# Patient Record
Sex: Female | Born: 1950
Health system: Southern US, Community
[De-identification: ages and names within clinical notes are randomized; demographics above are authoritative.]

## PROBLEM LIST (undated history)

## (undated) DIAGNOSIS — D649 Anemia, unspecified: Secondary | ICD-10-CM

## (undated) DIAGNOSIS — Z5189 Encounter for other specified aftercare: Secondary | ICD-10-CM

## (undated) DIAGNOSIS — K219 Gastro-esophageal reflux disease without esophagitis: Secondary | ICD-10-CM

## (undated) DIAGNOSIS — H269 Unspecified cataract: Secondary | ICD-10-CM

## (undated) DIAGNOSIS — I10 Essential (primary) hypertension: Secondary | ICD-10-CM

## (undated) DIAGNOSIS — R011 Cardiac murmur, unspecified: Secondary | ICD-10-CM

## (undated) DIAGNOSIS — E119 Type 2 diabetes mellitus without complications: Secondary | ICD-10-CM

## (undated) DIAGNOSIS — R195 Other fecal abnormalities: Secondary | ICD-10-CM

## (undated) DIAGNOSIS — E215 Disorder of parathyroid gland, unspecified: Secondary | ICD-10-CM

## (undated) DIAGNOSIS — R42 Dizziness and giddiness: Secondary | ICD-10-CM

## (undated) DIAGNOSIS — E7800 Pure hypercholesterolemia, unspecified: Secondary | ICD-10-CM

## (undated) DIAGNOSIS — G4730 Sleep apnea, unspecified: Secondary | ICD-10-CM

## (undated) DIAGNOSIS — Z87442 Personal history of urinary calculi: Secondary | ICD-10-CM

## (undated) DIAGNOSIS — N200 Calculus of kidney: Secondary | ICD-10-CM

## (undated) DIAGNOSIS — IMO0002 Reserved for concepts with insufficient information to code with codable children: Secondary | ICD-10-CM

## (undated) HISTORY — DX: Unspecified cataract: H26.9

## (undated) HISTORY — PX: ESOPHAGOGASTRODUODENOSCOPY ENDOSCOPY: SHX5814

## (undated) HISTORY — DX: Encounter for other specified aftercare: Z51.89

## (undated) HISTORY — DX: Reserved for concepts with insufficient information to code with codable children: IMO0002

## (undated) HISTORY — DX: Cardiac murmur, unspecified: R01.1

## (undated) HISTORY — PX: COLONOSCOPY: SHX174

---

## 1988-07-03 HISTORY — PX: PARATHYROIDECTOMY: SHX19

## 2000-07-03 HISTORY — PX: LITHOTRIPSY: SUR834

## 2004-04-14 ENCOUNTER — Ambulatory Visit: Payer: Self-pay | Admitting: Family Medicine

## 2005-01-05 ENCOUNTER — Emergency Department: Payer: Self-pay | Admitting: Internal Medicine

## 2005-01-05 ENCOUNTER — Other Ambulatory Visit: Payer: Self-pay

## 2005-04-20 ENCOUNTER — Ambulatory Visit: Payer: Self-pay | Admitting: Family Medicine

## 2005-05-03 ENCOUNTER — Ambulatory Visit: Payer: Self-pay | Admitting: Family Medicine

## 2005-09-11 ENCOUNTER — Ambulatory Visit: Payer: Self-pay | Admitting: Unknown Physician Specialty

## 2006-05-15 ENCOUNTER — Ambulatory Visit: Payer: Self-pay | Admitting: Family Medicine

## 2007-06-13 ENCOUNTER — Ambulatory Visit: Payer: Self-pay | Admitting: Family Medicine

## 2008-07-09 ENCOUNTER — Ambulatory Visit: Payer: Self-pay | Admitting: Family Medicine

## 2009-07-27 ENCOUNTER — Ambulatory Visit: Payer: Self-pay | Admitting: Family Medicine

## 2009-10-08 ENCOUNTER — Ambulatory Visit: Payer: Self-pay | Admitting: Urology

## 2009-11-08 ENCOUNTER — Ambulatory Visit: Payer: Self-pay | Admitting: Urology

## 2010-02-08 ENCOUNTER — Ambulatory Visit: Payer: Self-pay | Admitting: Urology

## 2010-09-05 ENCOUNTER — Ambulatory Visit: Payer: Self-pay | Admitting: Urology

## 2010-09-13 ENCOUNTER — Ambulatory Visit: Payer: Self-pay | Admitting: Family Medicine

## 2011-08-25 ENCOUNTER — Ambulatory Visit: Payer: Self-pay | Admitting: Family Medicine

## 2011-09-04 LAB — HM PAP SMEAR: HM Pap smear: NEGATIVE

## 2011-09-05 ENCOUNTER — Ambulatory Visit: Payer: Self-pay | Admitting: Urology

## 2011-10-10 ENCOUNTER — Ambulatory Visit: Payer: Self-pay | Admitting: Family Medicine

## 2012-09-09 ENCOUNTER — Ambulatory Visit: Payer: Self-pay | Admitting: Urology

## 2012-10-10 ENCOUNTER — Ambulatory Visit: Payer: Self-pay | Admitting: Family Medicine

## 2013-05-21 ENCOUNTER — Ambulatory Visit: Payer: Self-pay | Admitting: Family Medicine

## 2013-11-25 LAB — HM COLONOSCOPY

## 2014-04-21 ENCOUNTER — Ambulatory Visit: Payer: Self-pay | Admitting: Family Medicine

## 2014-04-21 LAB — HM MAMMOGRAPHY

## 2014-07-17 LAB — LIPID PANEL
Cholesterol: 217 mg/dL — AB (ref 0–200)
HDL: 41 mg/dL (ref 35–70)
LDL Cholesterol: 130 mg/dL
Triglycerides: 231 mg/dL — AB (ref 40–160)

## 2014-07-17 LAB — CBC AND DIFFERENTIAL: Neutrophils Absolute: 6 /uL

## 2014-07-17 LAB — TSH: TSH: 1.34 u[IU]/mL (ref <=5.90)

## 2014-08-10 LAB — CBC AND DIFFERENTIAL
HCT: 42 % (ref 36–46)
Hemoglobin: 13.6 g/dL (ref 12.0–16.0)
Platelets: 250 10*3/uL (ref 150–399)
WBC: 5.9 10^3/mL

## 2014-08-10 LAB — HEPATIC FUNCTION PANEL
ALT: 9 U/L (ref 7–35)
AST: 16 U/L (ref 13–35)
Alkaline Phosphatase: 82 U/L (ref 25–125)
Bilirubin, Total: 0.2 mg/dL

## 2014-08-10 LAB — BASIC METABOLIC PANEL
BUN: 15 mg/dL (ref 4–21)
Creatinine: 1.2 mg/dL — AB (ref 0.5–1.1)
Glucose: 89 mg/dL
Potassium: 4.2 mmol/L (ref 3.4–5.3)
Sodium: 143 mmol/L (ref 137–147)

## 2014-10-09 LAB — HEMOGLOBIN A1C: Hgb A1c MFr Bld: 6.2 % — AB (ref 4.0–6.0)

## 2014-11-23 DIAGNOSIS — E1121 Type 2 diabetes mellitus with diabetic nephropathy: Secondary | ICD-10-CM | POA: Insufficient documentation

## 2014-11-23 DIAGNOSIS — IMO0002 Reserved for concepts with insufficient information to code with codable children: Secondary | ICD-10-CM | POA: Insufficient documentation

## 2014-11-23 DIAGNOSIS — E7800 Pure hypercholesterolemia, unspecified: Secondary | ICD-10-CM | POA: Insufficient documentation

## 2014-11-23 DIAGNOSIS — E1122 Type 2 diabetes mellitus with diabetic chronic kidney disease: Secondary | ICD-10-CM | POA: Insufficient documentation

## 2014-11-23 DIAGNOSIS — N183 Chronic kidney disease, stage 3 unspecified: Secondary | ICD-10-CM | POA: Insufficient documentation

## 2014-11-23 DIAGNOSIS — K760 Fatty (change of) liver, not elsewhere classified: Secondary | ICD-10-CM | POA: Insufficient documentation

## 2014-11-23 DIAGNOSIS — E1169 Type 2 diabetes mellitus with other specified complication: Secondary | ICD-10-CM | POA: Insufficient documentation

## 2014-11-23 DIAGNOSIS — G479 Sleep disorder, unspecified: Secondary | ICD-10-CM | POA: Insufficient documentation

## 2014-11-23 DIAGNOSIS — E669 Obesity, unspecified: Secondary | ICD-10-CM | POA: Insufficient documentation

## 2014-11-23 DIAGNOSIS — I10 Essential (primary) hypertension: Secondary | ICD-10-CM | POA: Insufficient documentation

## 2014-11-23 DIAGNOSIS — N184 Chronic kidney disease, stage 4 (severe): Secondary | ICD-10-CM | POA: Insufficient documentation

## 2014-11-23 DIAGNOSIS — B029 Zoster without complications: Secondary | ICD-10-CM | POA: Insufficient documentation

## 2014-11-23 DIAGNOSIS — K635 Polyp of colon: Secondary | ICD-10-CM | POA: Insufficient documentation

## 2014-11-23 DIAGNOSIS — I152 Hypertension secondary to endocrine disorders: Secondary | ICD-10-CM | POA: Insufficient documentation

## 2014-12-25 ENCOUNTER — Telehealth: Payer: Self-pay | Admitting: Family Medicine

## 2014-12-25 NOTE — Telephone Encounter (Signed)
This is a pt of Dr. Venia Minks. Mickel Baas with [facility] stated that  glipiZIDE (GLUCOTROL XL) 2.5 MG 24 hr tablet is on backorder and she would like to change the RX to 5 mg instead of 2.5 mg and pt take 1/2 a tablet instead of a whole tablet. Mickel Baas stated we could send in a new RX or we can give a verbal order. Thanks TNP

## 2014-12-25 NOTE — Telephone Encounter (Signed)
OK to take 1/2 of a 5mg  glipizide tablet. Can dispense #15.

## 2014-12-25 NOTE — Telephone Encounter (Signed)
Mickel Baas from [facility] notified.

## 2015-02-10 ENCOUNTER — Ambulatory Visit (INDEPENDENT_AMBULATORY_CARE_PROVIDER_SITE_OTHER): Payer: BLUE CROSS/BLUE SHIELD | Admitting: Family Medicine

## 2015-02-10 ENCOUNTER — Encounter: Payer: Self-pay | Admitting: Family Medicine

## 2015-02-10 VITALS — BP 124/78 | HR 72 | Temp 98.2°F | Resp 16 | Ht 69.0 in | Wt 236.0 lb

## 2015-02-10 DIAGNOSIS — I10 Essential (primary) hypertension: Secondary | ICD-10-CM

## 2015-02-10 DIAGNOSIS — E1121 Type 2 diabetes mellitus with diabetic nephropathy: Secondary | ICD-10-CM

## 2015-02-10 DIAGNOSIS — E119 Type 2 diabetes mellitus without complications: Secondary | ICD-10-CM | POA: Diagnosis not present

## 2015-02-10 DIAGNOSIS — N183 Chronic kidney disease, stage 3 unspecified: Secondary | ICD-10-CM

## 2015-02-10 DIAGNOSIS — N200 Calculus of kidney: Secondary | ICD-10-CM | POA: Insufficient documentation

## 2015-02-10 LAB — POCT GLYCOSYLATED HEMOGLOBIN (HGB A1C)
Est. average glucose Bld gHb Est-mCnc: 126
Hemoglobin A1C: 6

## 2015-02-10 LAB — POCT UA - MICROALBUMIN: Microalbumin Ur, POC: NEGATIVE mg/L

## 2015-02-10 MED ORDER — ASPIRIN 81 MG PO TABS: 81.0000 mg | ORAL_TABLET | Freq: Every day | ORAL | Status: DC

## 2015-02-10 NOTE — Progress Notes (Signed)
Subjective:    Patient ID: Debra Paul, female    DOB: 1950-12-04, 64 y.o.   MRN: [id number]  Diabetes She presents for her follow-up (Last A1C was 10/09/2014, and the result was 6.2%) diabetic visit. She has type 2 diabetes mellitus. There are no hypoglycemic associated symptoms. Pertinent negatives for hypoglycemia include no headaches or sweats. Associated symptoms include polydipsia. Pertinent negatives for diabetes include no blurred vision, no chest pain, no fatigue, no foot paresthesias, no foot ulcerations, no polyphagia, no polyuria, no visual change, no weakness and no weight loss. Current diabetic treatment includes oral agent (triple therapy). She is following a generally unhealthy diet. She never participates in exercise. (BS Not being checked) An ACE inhibitor/angiotensin II receptor blocker is being taken. She does not see a podiatrist.Eye exam is current.  Hypertension This is a chronic problem. Pertinent negatives include no anxiety, blurred vision, chest pain, headaches, malaise/fatigue, neck pain, orthopnea, palpitations, peripheral edema, shortness of breath or sweats. Treatments tried: Lisinopril 10 mg.      Review of Systems  Constitutional: Negative for weight loss, malaise/fatigue and fatigue.  Eyes: Negative for blurred vision.  Respiratory: Negative for shortness of breath.   Cardiovascular: Negative for chest pain, palpitations and orthopnea.  Endocrine: Positive for polydipsia. Negative for polyphagia and polyuria.  Musculoskeletal: Negative for neck pain.  Neurological: Negative for weakness and headaches.     Patient Active Problem List   Diagnosis Date Noted  . Calculus of kidney 02/10/2015  . Benign hypertension 11/23/2014  . Adult BMI 30+ 11/23/2014  . Chronic kidney disease (CKD), stage III (moderate) 11/23/2014  . Colon polyp 11/23/2014  . Diabetes 11/23/2014  . Fatty infiltration of liver 11/23/2014  . Herpes zona 11/23/2014  .  Hypercholesteremia 11/23/2014  . Disordered sleep 11/23/2014   No past medical history on file. Current Outpatient Prescriptions on File Prior to Visit  Medication Sig  . glipiZIDE (GLUCOTROL XL) 2.5 MG 24 hr tablet Take by mouth.  Marland Kitchen lisinopril (PRINIVIL,ZESTRIL) 10 MG tablet Take by mouth.  . metFORMIN (GLUCOPHAGE) 1000 MG tablet Take by mouth.  . sitaGLIPtin (JANUVIA) 50 MG tablet Take by mouth.   No current facility-administered medications on file prior to visit.   Allergies  Allergen Reactions  . Ciprofloxacin    Past Surgical History  Procedure Laterality Date  . Lithotripsy  2002  . Parathyroidectomy  1990   Social History   Social History  . Marital Status: Single    Spouse Name: N/A  . Number of Children: N/A  . Years of Education: N/A   Occupational History  . Not on file.   Social History Main Topics  . Smoking status: Never Smoker   . Smokeless tobacco: Never Used  . Alcohol Use: No  . Drug Use: No  . Sexual Activity: Not on file   Other Topics Concern  . Not on file   Social History Narrative   Family History  Problem Relation Age of Onset  . Colon cancer Father   . Heart disease Mother         Objective:   Physical Exam  Constitutional: She is oriented to person, place, and time. She appears well-developed and well-nourished.  Cardiovascular: Normal rate and regular rhythm.   Neurological: She is alert and oriented to person, place, and time.  Psychiatric: She has a normal mood and affect. Her behavior is normal. Judgment and thought content normal.   Diabetic Foot Exam - Simple   Simple Foot Form  Diabetic Foot exam was performed with the following findings:  Yes 02/10/2015  8:47 AM  Visual Inspection  No deformities, no ulcerations, no other skin breakdown bilaterally:  Yes  Sensation Testing  Intact to touch and monofilament testing bilaterally:  Yes  Pulse Check  Posterior Tibialis and Dorsalis pulse intact bilaterally:  Yes   Comments     BP 124/78 mmHg  Pulse 72  Temp(Src) 98.2 F (36.8 C) (Oral)  Resp 16  Ht 5\' 9"  (1.753 m)  Wt 236 lb (107.049 kg)  BMI 34.84 kg/m2        Assessment & Plan:  1. Benign hypertension Stable.  Continue current medication.  Recheck in 3 months.   2. Chronic kidney disease (CKD), stage III (moderate) Stable. Continue ACE. No proteinuria.  Results for orders placed or performed in visit on 02/10/15  POCT glycosylated hemoglobin (Hb A1C)  Result Value Ref Range   Hemoglobin A1C 6.0    Est. average glucose Bld gHb Est-mCnc 126   POCT UA - Microalbumin  Result Value Ref Range   Microalbumin Ur, POC Negative mg/L     3. Controlled type 2 diabetes mellitus with diabetic nephropathy Stable. Continue current medication except stop Glipizide. Recheck in 3 months.   Margarita Rana, MD

## 2015-02-20 ENCOUNTER — Other Ambulatory Visit: Payer: Self-pay | Admitting: Family Medicine

## 2015-02-20 DIAGNOSIS — E1121 Type 2 diabetes mellitus with diabetic nephropathy: Secondary | ICD-10-CM

## 2015-03-03 ENCOUNTER — Other Ambulatory Visit: Payer: Self-pay | Admitting: Family Medicine

## 2015-03-03 DIAGNOSIS — I10 Essential (primary) hypertension: Secondary | ICD-10-CM

## 2015-04-17 ENCOUNTER — Ambulatory Visit (INDEPENDENT_AMBULATORY_CARE_PROVIDER_SITE_OTHER): Payer: BLUE CROSS/BLUE SHIELD | Admitting: Family Medicine

## 2015-04-17 ENCOUNTER — Encounter: Payer: Self-pay | Admitting: Family Medicine

## 2015-04-17 VITALS — BP 128/84 | HR 74 | Temp 98.0°F | Resp 16 | Ht 69.0 in | Wt 237.0 lb

## 2015-04-17 DIAGNOSIS — N183 Chronic kidney disease, stage 3 unspecified: Secondary | ICD-10-CM

## 2015-04-17 DIAGNOSIS — E1121 Type 2 diabetes mellitus with diabetic nephropathy: Secondary | ICD-10-CM | POA: Diagnosis not present

## 2015-04-17 DIAGNOSIS — J40 Bronchitis, not specified as acute or chronic: Secondary | ICD-10-CM | POA: Diagnosis not present

## 2015-04-17 MED ORDER — AZITHROMYCIN 250 MG PO TABS: ORAL_TABLET | ORAL | Status: DC

## 2015-04-17 NOTE — Progress Notes (Signed)
Patient ID: Debra Paul, female   DOB: 1950-10-18, 64 y.o.   MRN: [id number]       Patient: Debra Paul Female    DOB: 1951/02/19   64 y.o.   MRN: [id number] Visit Date: 04/17/2015  Today's Provider: Margarita Rana, MD   Chief Complaint  Patient presents with  . Cough    x 3 weeks.    Subjective:    Cough The current episode started 1 to 4 weeks ago (Going into her third week. ). The problem has been gradually worsening. The problem occurs constantly. The cough is productive of sputum (green in color ). Associated symptoms include chills, ear congestion, nasal congestion, postnasal drip, a sore throat, shortness of breath and wheezing. Pertinent negatives include no fever or rash. Nothing aggravates the symptoms. She has tried OTC cough suppressant for the symptoms. The treatment provided no relief. There is no history of asthma or environmental allergies.   Has been trying to drink a lot of  Liquids and take honey. Still with cough.     Allergies  Allergen Reactions  . Ciprofloxacin    Previous Medications   ASPIRIN EC 81 MG TABLET    Take 81 mg by mouth.   GLIPIZIDE (GLUCOTROL) 5 MG TABLET    TAKE 1/2 TABLET BY MOUTH DAILY   LISINOPRIL (PRINIVIL,ZESTRIL) 10 MG TABLET    TAKE 1 TABLET BY MOUTH EVERY DAY   METFORMIN (GLUCOPHAGE) 1000 MG TABLET    Take by mouth.   SITAGLIPTIN (JANUVIA) 50 MG TABLET    Take by mouth.    Review of Systems  Constitutional: Positive for chills. Negative for fever.  HENT: Positive for postnasal drip and sore throat.   Respiratory: Positive for cough, shortness of breath and wheezing.   Cardiovascular: Negative.   Skin: Negative for rash.  Allergic/Immunologic: Negative for environmental allergies.    Social History  Substance Use Topics  . Smoking status: Never Smoker   . Smokeless tobacco: Never Used  . Alcohol Use: No   Objective:   BP 128/84 mmHg  Pulse 74  Temp(Src) 98 F (36.7 C)  Resp 16  Ht 5\' 9"  (1.753 m)  Wt  237 lb (107.502 kg)  BMI 34.98 kg/m2  SpO2 98%  Physical Exam  Constitutional: She is oriented to person, place, and time. She appears well-developed and well-nourished.  HENT:  Head: Normocephalic and atraumatic.  Right Ear: External ear normal.  Left Ear: External ear normal.  Mouth/Throat: Oropharynx is clear and moist.  Eyes: Conjunctivae and EOM are normal. Pupils are equal, round, and reactive to light.  Neck: Normal range of motion. Neck supple.  Cardiovascular: Normal rate and regular rhythm.   Pulmonary/Chest: Effort normal. She has wheezes (Inspiratory and expiratory).  Neurological: She is alert and oriented to person, place, and time.  Psychiatric: She has a normal mood and affect. Her behavior is normal. Judgment and thought content normal.      Assessment & Plan:     1. Bronchitis New problem. Will start medication and call if does not improve. May need steroid secondary to wheezing.  Monitor for fevers and SOB most especially.   - azithromycin (ZITHROMAX) 250 MG tablet; 2 today and then one a day for 4 more days.  Dispense: 6 tablet; Refill: 0  2. Chronic kidney disease (CKD), stage III (moderate) Stable.    3. Controlled type 2 diabetes mellitus with diabetic nephropathy, without long-term current use of insulin ([place]) Thinks up a little  secondary to cough aids she has been using, but stable over all.  Recheck as scheduled.   Margarita Rana, MD       Margarita Rana, MD  [facility]

## 2015-04-29 ENCOUNTER — Other Ambulatory Visit: Payer: Self-pay | Admitting: Family Medicine

## 2015-04-29 DIAGNOSIS — Z1231 Encounter for screening mammogram for malignant neoplasm of breast: Secondary | ICD-10-CM

## 2015-05-04 ENCOUNTER — Ambulatory Visit
Admission: RE | Admit: 2015-05-04 | Discharge: 2015-05-04 | Disposition: A | Discharge status: Home / Self Care | Payer: BLUE CROSS/BLUE SHIELD | Source: Ambulatory Visit | Attending: Family Medicine | Admitting: Family Medicine

## 2015-05-04 DIAGNOSIS — Z1231 Encounter for screening mammogram for malignant neoplasm of breast: Secondary | ICD-10-CM | POA: Insufficient documentation

## 2015-05-06 ENCOUNTER — Other Ambulatory Visit: Payer: Self-pay | Admitting: Family Medicine

## 2015-05-06 DIAGNOSIS — E1121 Type 2 diabetes mellitus with diabetic nephropathy: Secondary | ICD-10-CM

## 2015-05-14 ENCOUNTER — Encounter: Payer: Self-pay | Admitting: Family Medicine

## 2015-05-14 ENCOUNTER — Ambulatory Visit (INDEPENDENT_AMBULATORY_CARE_PROVIDER_SITE_OTHER): Payer: BLUE CROSS/BLUE SHIELD | Admitting: Family Medicine

## 2015-05-14 VITALS — BP 152/90 | HR 68 | Temp 98.4°F | Resp 16 | Wt 233.0 lb

## 2015-05-14 DIAGNOSIS — N183 Chronic kidney disease, stage 3 unspecified: Secondary | ICD-10-CM

## 2015-05-14 DIAGNOSIS — E1121 Type 2 diabetes mellitus with diabetic nephropathy: Secondary | ICD-10-CM

## 2015-05-14 DIAGNOSIS — I10 Essential (primary) hypertension: Secondary | ICD-10-CM | POA: Diagnosis not present

## 2015-05-14 LAB — POCT GLYCOSYLATED HEMOGLOBIN (HGB A1C)
Est. average glucose Bld gHb Est-mCnc: 134
Hemoglobin A1C: 6.3

## 2015-05-14 LAB — POCT UA - MICROALBUMIN: Microalbumin Ur, POC: 100 mg/L

## 2015-05-14 NOTE — Progress Notes (Signed)
Subjective:    Patient ID: Debra Paul, female    DOB: 02-Mar-1951, 64 y.o.   MRN: [id number]  Diabetes She presents for her follow-up (Last A1C 02/10/2015 and was 6.0% Pt D/C Glipizide at [place]) diabetic visit. She has type 2 diabetes mellitus. Pertinent negatives for hypoglycemia include no headaches or sweats. Pertinent negatives for diabetes include no blurred vision, no chest pain, no fatigue, no foot paresthesias, no foot ulcerations, no polydipsia, no polyphagia, no polyuria, no visual change, no weakness and no weight loss. Current diabetic treatment includes oral agent (dual therapy) (Metformin and Januvia). She is compliant with treatment all of the time. Home blood sugar record trend: not being checked. An ACE inhibitor/angiotensin II receptor blocker is being taken (Lisinopril 10mg ). Eye exam is current (Summer 2016).  Hypertension This is a chronic problem. The problem is uncontrolled. Associated symptoms include anxiety (due to job stress). Pertinent negatives include no blurred vision, chest pain, headaches, malaise/fatigue, neck pain, orthopnea, palpitations, peripheral edema, shortness of breath or sweats. Past treatments include ACE inhibitors (Lisinopril 10 mg). There are no compliance problems.       Review of Systems  Constitutional: Negative for weight loss, malaise/fatigue and fatigue.  Eyes: Negative for blurred vision.  Respiratory: Negative for shortness of breath.   Cardiovascular: Negative for chest pain, palpitations and orthopnea.  Endocrine: Negative for polydipsia, polyphagia and polyuria.  Musculoskeletal: Negative for neck pain.  Neurological: Negative for weakness and headaches.   BP 152/90 mmHg  Pulse 68  Temp(Src) 98.4 F (36.9 C) (Oral)  Resp 16  Wt 233 lb (105.688 kg)   Patient Active Problem List   Diagnosis Date Noted  . Calculus of kidney 02/10/2015  . Benign hypertension 11/23/2014  . Adult BMI 30+ 11/23/2014  . Chronic kidney  disease (CKD), stage III (moderate) 11/23/2014  . Colon polyp 11/23/2014  . Controlled type 2 diabetes mellitus with diabetic nephropathy ([place]) 11/23/2014  . Fatty infiltration of liver 11/23/2014  . Herpes zona 11/23/2014  . Hypercholesteremia 11/23/2014  . Disordered sleep 11/23/2014   No past medical history on file. Current Outpatient Prescriptions on File Prior to Visit  Medication Sig  . aspirin EC 81 MG tablet Take 81 mg by mouth.  Marland Kitchen JANUVIA 50 MG tablet TAKE 1 TABLET BY MOUTH EVERY DAY  . lisinopril (PRINIVIL,ZESTRIL) 10 MG tablet TAKE 1 TABLET BY MOUTH EVERY DAY  . metFORMIN (GLUCOPHAGE) 1000 MG tablet Take by mouth.   No current facility-administered medications on file prior to visit.   Allergies  Allergen Reactions  . Ciprofloxacin    Past Surgical History  Procedure Laterality Date  . Lithotripsy  2002  . Parathyroidectomy  1990   Social History   Social History  . Marital Status: Single    Spouse Name: N/A  . Number of Children: N/A  . Years of Education: N/A   Occupational History  . Not on file.   Social History Main Topics  . Smoking status: Never Smoker   . Smokeless tobacco: Never Used  . Alcohol Use: No  . Drug Use: No  . Sexual Activity: Not on file   Other Topics Concern  . Not on file   Social History Narrative   Family History  Problem Relation Age of Onset  . Colon cancer Father   . Heart disease Mother       Objective:   Physical Exam  Constitutional: She is oriented to person, place, and time. She appears well-developed and well-nourished.  Cardiovascular: Normal rate and regular rhythm.   Pulmonary/Chest: Effort normal and breath sounds normal.  Neurological: She is alert and oriented to person, place, and time.  Psychiatric: She has a normal mood and affect. Her behavior is normal. Thought content normal.    BP 152/90 mmHg  Pulse 68  Temp(Src) 98.4 F (36.9 C) (Oral)  Resp 16  Wt 233 lb (105.688 kg)     Assessment &  Plan:  1. Controlled type 2 diabetes mellitus with diabetic nephropathy, without long-term current use of insulin (HCC) Condition is stable. Please continue current medication and  plan of care as noted.   - POCT glycosylated hemoglobin (Hb A1C) - POCT UA - Microalbumin Results for orders placed or performed in visit on 05/14/15  POCT glycosylated hemoglobin (Hb A1C)  Result Value Ref Range   Hemoglobin A1C 6.3    Est. average glucose Bld gHb Est-mCnc 134   POCT UA - Microalbumin  Result Value Ref Range   Microalbumin Ur, POC 100 mg/L    2. Benign hypertension Stable.    3. Chronic kidney disease (CKD), stage III (moderate) Stable.   Margarita Rana, MD

## 2015-05-31 ENCOUNTER — Other Ambulatory Visit: Payer: Self-pay | Admitting: Family Medicine

## 2015-05-31 DIAGNOSIS — E1121 Type 2 diabetes mellitus with diabetic nephropathy: Secondary | ICD-10-CM

## 2015-08-16 ENCOUNTER — Ambulatory Visit (INDEPENDENT_AMBULATORY_CARE_PROVIDER_SITE_OTHER): Payer: BLUE CROSS/BLUE SHIELD | Admitting: Family Medicine

## 2015-08-16 ENCOUNTER — Encounter: Payer: Self-pay | Admitting: Family Medicine

## 2015-08-16 VITALS — BP 152/82 | HR 72 | Temp 97.7°F | Resp 16 | Wt 236.0 lb

## 2015-08-16 DIAGNOSIS — E1121 Type 2 diabetes mellitus with diabetic nephropathy: Secondary | ICD-10-CM | POA: Diagnosis not present

## 2015-08-16 DIAGNOSIS — I10 Essential (primary) hypertension: Secondary | ICD-10-CM

## 2015-08-16 DIAGNOSIS — R202 Paresthesia of skin: Secondary | ICD-10-CM

## 2015-08-16 DIAGNOSIS — R809 Proteinuria, unspecified: Secondary | ICD-10-CM | POA: Diagnosis not present

## 2015-08-16 LAB — POCT GLYCOSYLATED HEMOGLOBIN (HGB A1C)
Est. average glucose Bld gHb Est-mCnc: 148
Hemoglobin A1C: 6.8

## 2015-08-16 MED ORDER — LISINOPRIL 20 MG PO TABS: 20.0000 mg | ORAL_TABLET | Freq: Every day | ORAL | Status: DC

## 2015-08-16 NOTE — Progress Notes (Signed)
Subjective:    Patient ID: Debra Paul, female    DOB: 03/14/1951, 65 y.o.   MRN: [id number]  Diabetes She presents for her follow-up (Last A1C was 05/14/2015 and was 6.3%) diabetic visit. She has type 2 diabetes mellitus. Her disease course has been worsening. There are no hypoglycemic associated symptoms. Associated symptoms include foot paresthesias (new problem; "tingling"). Pertinent negatives for diabetes include no blurred vision, no chest pain, no fatigue, no foot ulcerations, no polydipsia, no polyphagia, no polyuria, no visual change, no weakness and no weight loss. Symptoms are worsening (parasthesias). Risk factors for coronary artery disease include diabetes mellitus, dyslipidemia, obesity, family history and post-menopausal. Current diabetic treatment includes oral agent (dual therapy) (Metformin 1000 mg, Januvia 50 mg). She is compliant with treatment all of the time. She is following a generally healthy diet. She never (too busy with work) participates in exercise. Home blood sugar record trend: not being checked. An ACE inhibitor/angiotensin II receptor blocker is being taken. Eye exam is current.  Hypertension This is a chronic problem. The problem has been gradually worsening (BP today is 152/82) since onset. The problem is uncontrolled. Pertinent negatives include no anxiety, blurred vision, chest pain, malaise/fatigue, neck pain, orthopnea, palpitations, peripheral edema or shortness of breath. Treatments tried: Lisinopril 10 mg. There are no compliance problems.     Review of Systems  Constitutional: Negative for weight loss, malaise/fatigue and fatigue.  Eyes: Negative for blurred vision.  Respiratory: Negative for shortness of breath.   Cardiovascular: Negative for chest pain, palpitations and orthopnea.  Endocrine: Negative for polydipsia, polyphagia and polyuria.  Musculoskeletal: Negative for neck pain.  Neurological: Negative for weakness.   BP 152/82 mmHg   Pulse 72  Temp(Src) 97.7 F (36.5 C) (Oral)  Resp 16  Wt 236 lb (107.049 kg)   Patient Active Problem List   Diagnosis Date Noted  . Calculus of kidney 02/10/2015  . Benign hypertension 11/23/2014  . Adult BMI 30+ 11/23/2014  . Chronic kidney disease (CKD), stage III (moderate) 11/23/2014  . Colon polyp 11/23/2014  . Controlled type 2 diabetes mellitus with diabetic nephropathy ([place]) 11/23/2014  . Fatty infiltration of liver 11/23/2014  . Herpes zona 11/23/2014  . Hypercholesteremia 11/23/2014  . Disordered sleep 11/23/2014   No past medical history on file. Current Outpatient Prescriptions on File Prior to Visit  Medication Sig  . aspirin EC 81 MG tablet Take 81 mg by mouth.  Marland Kitchen JANUVIA 50 MG tablet TAKE 1 TABLET BY MOUTH EVERY DAY  . lisinopril (PRINIVIL,ZESTRIL) 10 MG tablet TAKE 1 TABLET BY MOUTH EVERY DAY  . metFORMIN (GLUCOPHAGE) 1000 MG tablet Take by mouth.   No current facility-administered medications on file prior to visit.   Allergies  Allergen Reactions  . Ciprofloxacin    Past Surgical History  Procedure Laterality Date  . Lithotripsy  2002  . Parathyroidectomy  1990   Social History   Social History  . Marital Status: Single    Spouse Name: N/A  . Number of Children: N/A  . Years of Education: N/A   Occupational History  . Not on file.   Social History Main Topics  . Smoking status: Never Smoker   . Smokeless tobacco: Never Used  . Alcohol Use: No  . Drug Use: No  . Sexual Activity: Not on file   Other Topics Concern  . Not on file   Social History Narrative   Family History  Problem Relation Age of Onset  . Colon cancer  Father   . Heart disease Mother       Objective:   Physical Exam  Constitutional: She appears well-developed and well-nourished.  Cardiovascular: Normal rate and regular rhythm.   Pulmonary/Chest: Effort normal and breath sounds normal.  Neurological:  Monofilament Normal  Psychiatric: She has a normal mood and  affect. Her behavior is normal.   Diabetic Foot Exam - Simple   Simple Foot Form  Diabetic Foot exam was performed with the following findings:  Yes 08/16/2015  8:48 AM  Visual Inspection  No deformities, no ulcerations, no other skin breakdown bilaterally:  Yes  Sensation Testing  Intact to touch and monofilament testing bilaterally:  Yes  Pulse Check  Posterior Tibialis and Dorsalis pulse intact bilaterally:  Yes  Comments     BP 152/82 mmHg  Pulse 72  Temp(Src) 97.7 F (36.5 C) (Oral)  Resp 16  Wt 236 lb (107.049 kg)     Assessment & Plan:  1. Controlled type 2 diabetes mellitus with diabetic nephropathy, without long-term current use of insulin (HCC) Worsening. Check labs. FU pending results. - POCT glycosylated hemoglobin (Hb A1C) - CBC with Differential/Platelet - Lipid panel  2. Paresthesia of left foot New problem. Monofilament normal. Concerning due to increased A1C. Check labs. If labs are WNL, will consider adding DM medication.  - TSH - Vitamin B12   3. Albuminuria Last urine microalbumin was checked 05/14/2015 and was 100. Increase Lisinopril to 20 mg po qd.  4. Benign hypertension Not to goal. Increase medication as below. Check labs. FU pending results. - lisinopril (PRINIVIL,ZESTRIL) 20 MG tablet; Take 1 tablet (20 mg total) by mouth daily.  Dispense: 30 tablet; Refill: 6 - Comprehensive metabolic panel   Patient seen and examined by Jerrell Belfast, MD, and note scribed by Renaldo Fiddler, CMA.   I have reviewed the document for accuracy and completeness and I agree with above. Jerrell Belfast, MD   Margarita Rana, MD

## 2015-08-20 ENCOUNTER — Telehealth: Payer: Self-pay

## 2015-08-20 DIAGNOSIS — E875 Hyperkalemia: Secondary | ICD-10-CM

## 2015-08-20 LAB — COMPREHENSIVE METABOLIC PANEL
ALT: 14 IU/L (ref 0–32)
AST: 17 IU/L (ref 0–40)
Albumin/Globulin Ratio: 1.6 (ref 1.1–2.5)
Albumin: 4.1 g/dL (ref 3.6–4.8)
Alkaline Phosphatase: 83 IU/L (ref 39–117)
BUN/Creatinine Ratio: 22 (ref 11–26)
BUN: 34 mg/dL — ABNORMAL HIGH (ref 8–27)
Bilirubin Total: <0.2 mg/dL (ref 0.0–1.2)
CO2: 17 mmol/L — ABNORMAL LOW (ref 18–29)
Calcium: 9 mg/dL (ref 8.7–10.3)
Chloride: 104 mmol/L (ref 96–106)
Creatinine, Ser: 1.54 mg/dL — ABNORMAL HIGH (ref 0.57–1.00)
GFR calc Af Amer: 41 mL/min/{1.73_m2} — ABNORMAL LOW (ref >59)
GFR calc non Af Amer: 36 mL/min/{1.73_m2} — ABNORMAL LOW (ref >59)
Globulin, Total: 2.6 g/dL (ref 1.5–4.5)
Glucose: 126 mg/dL — ABNORMAL HIGH (ref 65–99)
Potassium: 5.3 mmol/L — ABNORMAL HIGH (ref 3.5–5.2)
Sodium: 140 mmol/L (ref 134–144)
Total Protein: 6.7 g/dL (ref 6.0–8.5)

## 2015-08-20 LAB — CBC WITH DIFFERENTIAL/PLATELET
Basophils Absolute: 0 10*3/uL (ref 0.0–0.2)
Basos: 1 %
EOS (ABSOLUTE): 0.3 10*3/uL (ref 0.0–0.4)
Eos: 4 %
Hematocrit: 36.5 % (ref 34.0–46.6)
Hemoglobin: 11.3 g/dL (ref 11.1–15.9)
Immature Grans (Abs): 0.1 10*3/uL (ref 0.0–0.1)
Immature Granulocytes: 1 %
Lymphocytes Absolute: 2.2 10*3/uL (ref 0.7–3.1)
Lymphs: 25 %
MCH: 25.5 pg — ABNORMAL LOW (ref 26.6–33.0)
MCHC: 31 g/dL — ABNORMAL LOW (ref 31.5–35.7)
MCV: 82 fL (ref 79–97)
Monocytes Absolute: 0.5 10*3/uL (ref 0.1–0.9)
Monocytes: 6 %
Neutrophils Absolute: 5.3 10*3/uL (ref 1.4–7.0)
Neutrophils: 63 %
Platelets: 218 10*3/uL (ref 150–379)
RBC: 4.44 x10E6/uL (ref 3.77–5.28)
RDW: 15.5 % — ABNORMAL HIGH (ref 12.3–15.4)
WBC: 8.5 10*3/uL (ref 3.4–10.8)

## 2015-08-20 LAB — VITAMIN B12: Vitamin B-12: 272 pg/mL (ref 211–946)

## 2015-08-20 LAB — LIPID PANEL
Chol/HDL Ratio: 5.2 ratio units — ABNORMAL HIGH (ref 0.0–4.4)
Cholesterol, Total: 198 mg/dL (ref 100–199)
HDL: 38 mg/dL — ABNORMAL LOW (ref >39)
Triglycerides: 414 mg/dL — ABNORMAL HIGH (ref 0–149)

## 2015-08-20 LAB — TSH: TSH: 2.17 u[IU]/mL (ref 0.450–4.500)

## 2015-08-20 NOTE — Telephone Encounter (Signed)
-----   Message from Margarita Rana, MD sent at 08/20/2015  7:13 AM EST ----- Labs stable except for worsening kidney function.   Also high potassium. Stay well hydrated.  And recheck in one week. Thanks.

## 2015-08-20 NOTE — Telephone Encounter (Signed)
Left message to call back  

## 2015-08-23 NOTE — Telephone Encounter (Signed)
Advised patient as below. Dr. Jerilynn Mages, should I order a Renal panel or Met C? Please advise. Thanks!

## 2015-08-23 NOTE — Telephone Encounter (Signed)
Renal panel. Thanks.

## 2015-08-23 NOTE — Telephone Encounter (Signed)
Ordered renal panel and placed up front for patient to pick up.

## 2015-09-02 ENCOUNTER — Telehealth: Payer: Self-pay

## 2015-09-02 LAB — RENAL FUNCTION PANEL
Albumin: 4.4 g/dL (ref 3.6–4.8)
BUN/Creatinine Ratio: 22 (ref 11–26)
BUN: 35 mg/dL — ABNORMAL HIGH (ref 8–27)
CO2: 15 mmol/L — ABNORMAL LOW (ref 18–29)
Calcium: 9.2 mg/dL (ref 8.7–10.3)
Chloride: 106 mmol/L (ref 96–106)
Creatinine, Ser: 1.59 mg/dL — ABNORMAL HIGH (ref 0.57–1.00)
GFR calc Af Amer: 40 mL/min/{1.73_m2} — ABNORMAL LOW (ref >59)
GFR calc non Af Amer: 34 mL/min/{1.73_m2} — ABNORMAL LOW (ref >59)
Glucose: 120 mg/dL — ABNORMAL HIGH (ref 65–99)
Phosphorus: 3.8 mg/dL (ref 2.5–4.5)
Potassium: 5.2 mmol/L (ref 3.5–5.2)
Sodium: 139 mmol/L (ref 134–144)

## 2015-09-02 NOTE — Telephone Encounter (Signed)
LMTCB Donevan Biller Drozdowski, CMA  

## 2015-09-02 NOTE — Telephone Encounter (Signed)
-----   Message from Margarita Rana, MD sent at 09/02/2015  8:00 AM EST ----- Labs stable.  Please notify patient. Thanks.

## 2015-09-02 NOTE — Telephone Encounter (Signed)
Advised pt of lab results. Pt verbally acknowledges understanding. Debra Paul, CMA   

## 2015-09-20 ENCOUNTER — Encounter: Payer: Self-pay | Admitting: Physician Assistant

## 2015-09-20 ENCOUNTER — Ambulatory Visit (INDEPENDENT_AMBULATORY_CARE_PROVIDER_SITE_OTHER): Payer: BLUE CROSS/BLUE SHIELD | Admitting: Physician Assistant

## 2015-09-20 VITALS — BP 168/90 | HR 63 | Temp 97.8°F | Resp 16 | Wt 233.2 lb

## 2015-09-20 DIAGNOSIS — R319 Hematuria, unspecified: Secondary | ICD-10-CM | POA: Diagnosis not present

## 2015-09-20 DIAGNOSIS — N200 Calculus of kidney: Secondary | ICD-10-CM

## 2015-09-20 DIAGNOSIS — R1032 Left lower quadrant pain: Secondary | ICD-10-CM

## 2015-09-20 DIAGNOSIS — M545 Low back pain, unspecified: Secondary | ICD-10-CM

## 2015-09-20 LAB — POCT URINALYSIS DIPSTICK
Bilirubin, UA: NEGATIVE
Glucose, UA: NEGATIVE
Ketones, UA: NEGATIVE
Leukocytes, UA: NEGATIVE
Nitrite, UA: NEGATIVE
Protein, UA: 100
Spec Grav, UA: 1.015
Urobilinogen, UA: 0.2
pH, UA: 5

## 2015-09-20 MED ORDER — HYDROCODONE-ACETAMINOPHEN 5-325 MG PO TABS: 1.0000 | ORAL_TABLET | Freq: Four times a day (QID) | ORAL | Status: DC | PRN

## 2015-09-20 NOTE — Progress Notes (Signed)
Patient: Debra Paul Female    DOB: [birth year]   65 y.o.   MRN: [id number] Visit Date: 09/20/2015  Today's Provider: Mar Daring, PA-C   Chief Complaint  Patient presents with  . Back Pain   Subjective:    Back Pain This is a new problem. The current episode started today (started at midnight). The problem occurs constantly. The problem has been gradually worsening since onset. Pain location: Is mostly on the left side of her lower abdomen and radiates to the left side of her back. The quality of the pain is described as aching. Radiates to: to leftside of lower back. The pain is at a severity of 10/10. The pain is severe. The pain is the same all the time. The symptoms are aggravated by lying down. Associated symptoms include abdominal pain (Left side of the abdomen) and weakness. Pertinent negatives include no bowel incontinence, chest pain, dysuria, fever, headaches, leg pain, numbness, pelvic pain, perianal numbness or tingling. Risk factors: History of Kidney stones. Treatments tried: Aleve and GasX and Ducolax. The treatment provided no relief.       Allergies  Allergen Reactions  . Ciprofloxacin    Previous Medications   ASPIRIN EC 81 MG TABLET    Take 81 mg by mouth.   JANUVIA 50 MG TABLET    TAKE 1 TABLET BY MOUTH EVERY DAY   LISINOPRIL (PRINIVIL,ZESTRIL) 20 MG TABLET    Take 1 tablet (20 mg total) by mouth daily.   METFORMIN (GLUCOPHAGE) 1000 MG TABLET    Take by mouth.    Review of Systems  Constitutional: Negative for fever.  Respiratory: Negative.   Cardiovascular: Negative for chest pain.  Gastrointestinal: Positive for abdominal pain (Left side of the abdomen). Negative for nausea, vomiting, diarrhea and bowel incontinence.  Genitourinary: Positive for flank pain. Negative for dysuria, hematuria and pelvic pain.  Musculoskeletal: Positive for back pain.  Neurological: Positive for weakness. Negative for tingling, numbness and headaches.     Social History  Substance Use Topics  . Smoking status: Never Smoker   . Smokeless tobacco: Never Used  . Alcohol Use: No   Objective:   BP 168/90 mmHg  Pulse 63  Temp(Src) 97.8 F (36.6 C) (Oral)  Resp 16  Wt 233 lb 3.2 oz (105.779 kg)  Physical Exam  Constitutional: She is oriented to person, place, and time. She appears well-developed and well-nourished. No distress.  Cardiovascular: Normal rate, regular rhythm and normal heart sounds.  Exam reveals no gallop and no friction rub.   No murmur heard. Pulmonary/Chest: Effort normal and breath sounds normal. No respiratory distress. She has no wheezes. She has no rales.  Abdominal: Soft. Normal appearance and bowel sounds are normal. She exhibits no distension and no mass. There is no hepatosplenomegaly. There is tenderness in the suprapubic area and left lower quadrant. There is guarding and CVA tenderness (left side). There is no rebound.  Neurological: She is alert and oriented to person, place, and time.  Skin: Skin is warm and dry. She is not diaphoretic.  Vitals reviewed.       Assessment & Plan:     1. Kidney stone I do feel that her symptoms are most likely kidney stone. She does have a history of kidney stones stating that last imaging showed that she had renal calculi in both renal pelvis. I will get an x-ray to rule in or rule out kidney stones versus constipation or other causes.  I will also give Vicodin as below for pain control. She is to continue to push fluids. I will follow-up with her pending the x-ray. If kidney stone is present she is to follow-up with her urologist. She is to call the office if symptoms worsen in the meantime.  2. Left-sided low back pain without sciatica UA was positive for hematuria indicating possible kidney stone. See above medical treatment plan. - POCT urinalysis dipstick  3. Left lower quadrant pain See above medical treatment plan. - HYDROcodone-acetaminophen (NORCO/VICODIN)  5-325 MG tablet; Take 1 tablet by mouth every 6 (six) hours as needed for moderate pain.  Dispense: 30 tablet; Refill: 0 - DG Abd 2 Views; Future  4. Hematuria If no kidney stone seen on x-ray will have her follow-up with her urologist for evaluation of hematuria. - DG Abd 2 Views; Future  *Addend:  Xray results: CLINICAL DATA: Left lower quadrant abdominal pain, hematuria, history of kidney stones  EXAM: ABDOMEN - 2 VIEW  COMPARISON: KUB of 09/09/2012, 09/05/2011, and CT abdomen pelvis of 11/08/2009  FINDINGS: And 11 mm calcification now overlies the left abdomen in the expected region of the left UP junction. This may represent the previously noted left renal calculus which has moved into the left renal pelvis or proximal left ureter. Small right renal calculus also is noted. There is some small bowel gas with a few scattered air-fluid levels most consistent with mild ileus. No free intraperitoneal air is seen.  IMPRESSION: 1. 11 mm calcification overlies the expected region of the left UP junction which may represent a calculus as noted above. 2. Small right renal calculus. 3. Probable mild ileus.   Electronically Signed  By: Ivar Drape M.D.  On: 09/21/2015 15:09  I will have her follow-up with her urologist as soon as possible due to the size of the kidney stone as she will most likely be unable to pass the stone.      Mar Daring, PA-C  [facility]

## 2015-09-20 NOTE — Patient Instructions (Signed)
Kidney Stones °Kidney stones (urolithiasis) are deposits that form inside your kidneys. The intense pain is caused by the stone moving through the urinary tract. When the stone moves, the ureter goes into spasm around the stone. The stone is usually passed in the urine.  °CAUSES  °· A disorder that makes certain neck glands produce too much parathyroid hormone (primary hyperparathyroidism). °· A buildup of uric acid crystals, similar to gout in your joints. °· Narrowing (stricture) of the ureter. °· A kidney obstruction present at birth (congenital obstruction). °· Previous surgery on the kidney or ureters. °· Numerous kidney infections. °SYMPTOMS  °· Feeling sick to your stomach (nauseous). °· Throwing up (vomiting). °· Blood in the urine (hematuria). °· Pain that usually spreads (radiates) to the groin. °· Frequency or urgency of urination. °DIAGNOSIS  °· Taking a history and physical exam. °· Blood or urine tests. °· CT scan. °· Occasionally, an examination of the inside of the urinary bladder (cystoscopy) is performed. °TREATMENT  °· Observation. °· Increasing your fluid intake. °· Extracorporeal shock wave lithotripsy--This is a noninvasive procedure that uses shock waves to break up kidney stones. °· Surgery may be needed if you have severe pain or persistent obstruction. There are various surgical procedures. Most of the procedures are performed with the use of small instruments. Only small incisions are needed to accommodate these instruments, so recovery time is minimized. °The size, location, and chemical composition are all important variables that will determine the proper choice of action for you. Talk to your health care provider to better understand your situation so that you will minimize the risk of injury to yourself and your kidney.  °HOME CARE INSTRUCTIONS  °· Drink enough water and fluids to keep your urine clear or pale yellow. This will help you to pass the stone or stone fragments. °· Strain  all urine through the provided strainer. Keep all particulate matter and stones for your health care provider to see. The stone causing the pain may be as small as a grain of salt. It is very important to use the strainer each and every time you pass your urine. The collection of your stone will allow your health care provider to analyze it and verify that a stone has actually passed. The stone analysis will often identify what you can do to reduce the incidence of recurrences. °· Only take over-the-counter or prescription medicines for pain, discomfort, or fever as directed by your health care provider. °· Keep all follow-up visits as told by your health care provider. This is important. °· Get follow-up X-rays if required. The absence of pain does not always mean that the stone has passed. It may have only stopped moving. If the urine remains completely obstructed, it can cause loss of kidney function or even complete destruction of the kidney. It is your responsibility to make sure X-rays and follow-ups are completed. Ultrasounds of the kidney can show blockages and the status of the kidney. Ultrasounds are not associated with any radiation and can be performed easily in a matter of minutes. °· Make changes to your daily diet as told by your health care provider. You may be told to: °¨ Limit the amount of salt that you eat. °¨ Eat 5 or more servings of fruits and vegetables each day. °¨ Limit the amount of meat, poultry, fish, and eggs that you eat. °· Collect a 24-hour urine sample as told by your health care provider. You may need to collect another urine sample every 6-12   months. °SEEK MEDICAL CARE IF: °· You experience pain that is progressive and unresponsive to any pain medicine you have been prescribed. °SEEK IMMEDIATE MEDICAL CARE IF:  °· Pain cannot be controlled with the prescribed medicine. °· You have a fever or shaking chills. °· The severity or intensity of pain increases over 18 hours and is not  relieved by pain medicine. °· You develop a new onset of abdominal pain. °· You feel faint or pass out. °· You are unable to urinate. °  °This information is not intended to replace advice given to you by your health care provider. Make sure you discuss any questions you have with your health care provider. °  °Document Released: 06/19/2005 Document Revised: 03/10/2015 Document Reviewed: 11/20/2012 °Elsevier Interactive Patient Education ©2016 [employer] ° °

## 2015-09-21 ENCOUNTER — Telehealth: Payer: Self-pay | Admitting: Family Medicine

## 2015-09-21 ENCOUNTER — Ambulatory Visit
Admission: RE | Admit: 2015-09-21 | Discharge: 2015-09-21 | Disposition: A | Discharge status: Home / Self Care | Payer: BLUE CROSS/BLUE SHIELD | Source: Ambulatory Visit | Attending: Physician Assistant | Admitting: Physician Assistant

## 2015-09-21 DIAGNOSIS — R1032 Left lower quadrant pain: Secondary | ICD-10-CM | POA: Insufficient documentation

## 2015-09-21 DIAGNOSIS — R319 Hematuria, unspecified: Secondary | ICD-10-CM | POA: Diagnosis present

## 2015-09-21 DIAGNOSIS — N200 Calculus of kidney: Secondary | ICD-10-CM | POA: Diagnosis not present

## 2015-09-21 NOTE — Telephone Encounter (Signed)
Pt called wanting results from her xrays yesterday.  She is still in pain and wants to know if it showed kidney stones.  Her call back is  [phone] or work [phone]  Thanks  Con Memos

## 2015-09-22 NOTE — Telephone Encounter (Signed)
Patient advised as directed below. Voiced understanding.  Thanks,  -Joseline 

## 2015-09-22 NOTE — Telephone Encounter (Signed)
She does have an 36mm kidney stone in the left that has moved from the kidney and is now at junction of the bladder.  Normally anything greater than 11mm is hard to pass, to immpossible to pass. I recommend her to call her urologist to see if she can get in asap. If she has trouble getting an appointment we can facilitate this for her.  Thanks.

## 2015-09-22 NOTE — Telephone Encounter (Signed)
Please review. Thanks!  

## 2015-09-29 HISTORY — PX: CYSTOSCOPY WITH URETEROSCOPY AND STENT PLACEMENT: SHX6377

## 2015-10-11 ENCOUNTER — Encounter: Payer: Self-pay | Admitting: Family Medicine

## 2015-10-11 ENCOUNTER — Ambulatory Visit (INDEPENDENT_AMBULATORY_CARE_PROVIDER_SITE_OTHER): Payer: BLUE CROSS/BLUE SHIELD | Admitting: Family Medicine

## 2015-10-11 VITALS — BP 126/82 | HR 64 | Temp 97.9°F | Resp 16 | Ht 69.0 in | Wt 222.0 lb

## 2015-10-11 DIAGNOSIS — R319 Hematuria, unspecified: Secondary | ICD-10-CM

## 2015-10-11 DIAGNOSIS — Z0000 Encounter for general adult medical examination without abnormal findings: Secondary | ICD-10-CM

## 2015-10-11 DIAGNOSIS — E668 Other obesity: Secondary | ICD-10-CM | POA: Diagnosis not present

## 2015-10-11 DIAGNOSIS — E7800 Pure hypercholesterolemia, unspecified: Secondary | ICD-10-CM | POA: Diagnosis not present

## 2015-10-11 DIAGNOSIS — E1121 Type 2 diabetes mellitus with diabetic nephropathy: Secondary | ICD-10-CM

## 2015-10-11 DIAGNOSIS — IMO0002 Reserved for concepts with insufficient information to code with codable children: Secondary | ICD-10-CM

## 2015-10-11 DIAGNOSIS — I10 Essential (primary) hypertension: Secondary | ICD-10-CM | POA: Diagnosis not present

## 2015-10-11 LAB — POCT URINALYSIS DIPSTICK
Bilirubin, UA: NEGATIVE
Glucose, UA: NEGATIVE
Ketones, UA: NEGATIVE
Nitrite, UA: NEGATIVE
Protein, UA: POSITIVE
Spec Grav, UA: 1.02
Urobilinogen, UA: 0.2
pH, UA: 5

## 2015-10-11 LAB — POCT UA - MICROALBUMIN: Microalbumin Ur, POC: 100 mg/L

## 2015-10-11 NOTE — Progress Notes (Signed)
Patient ID: [name], female   DOB: [birth year], 65 y.o.   MRN: [id number]       Patient: Debra Paul, Female    DOB: 1951-05-20, 65 y.o.   MRN: [id number] Visit Date: 10/11/2015  Today's Provider: Margarita Rana, MD   Chief Complaint  Patient presents with  . Annual Exam   Subjective:    Annual physical exam Debra Paul is a 65 y.o. female who presents today for health maintenance and complete physical. She feels well. She reports exercising none. She reports she is sleeping well. 10/09/14 CPE 09/04/11 Pap-neg:HPV-neg 05/04/15 Mammogram-BI-RADS 1 11/25/13 Colonoscopy-polyp, tubular adenoma, recheck in 5 yrs Dr. Vira Agar  -----------------------------------------------------------------   Review of Systems  Constitutional: Negative.   HENT: Negative.   Eyes: Negative.   Respiratory: Negative.   Cardiovascular: Negative.   Gastrointestinal: Negative.   Endocrine: Negative.   Genitourinary: Negative.   Musculoskeletal: Negative.   Skin: Negative.   Allergic/Immunologic: Negative.   Neurological: Negative.   Hematological: Negative.   Psychiatric/Behavioral: Negative.     Social History      She  reports that she has never smoked. She has never used smokeless tobacco. She reports that she does not drink alcohol or use illicit drugs.       Social History   Social History  . Marital Status: Single    Spouse Name: N/A  . Number of Children: N/A  . Years of Education: N/A   Social History Main Topics  . Smoking status: Never Smoker   . Smokeless tobacco: Never Used  . Alcohol Use: No  . Drug Use: No  . Sexual Activity: Not Asked   Other Topics Concern  . None   Social History Narrative    History reviewed. No pertinent past medical history.   Patient Active Problem List   Diagnosis Date Noted  . Hematuria 10/11/2015  . Calculus of kidney 02/10/2015  . Benign hypertension 11/23/2014  . Adult BMI 30+ 11/23/2014  . Chronic  kidney disease (CKD), stage III (moderate) 11/23/2014  . Colon polyp 11/23/2014  . Controlled type 2 diabetes mellitus with diabetic nephropathy ([place]) 11/23/2014  . Fatty infiltration of liver 11/23/2014  . Herpes zona 11/23/2014  . Hypercholesteremia 11/23/2014  . Disordered sleep 11/23/2014    Past Surgical History  Procedure Laterality Date  . Lithotripsy  2002  . Parathyroidectomy  1990  . Cystoscopy with ureteroscopy and stent placement  09/29/2015    Dr. Bernardo Heater    Family History        Family Status  Relation Status Death Age  . Father Deceased 47s  . Mother Alive   . Sister Alive   . Brother Alive   . Son Alive   . Brother Alive         Her family history includes Colon cancer in her father; Heart disease in her mother.    Allergies  Allergen Reactions  . Ciprofloxacin     Previous Medications   ASPIRIN EC 81 MG TABLET    Take 81 mg by mouth.   JANUVIA 50 MG TABLET    TAKE 1 TABLET BY MOUTH EVERY DAY   LISINOPRIL (PRINIVIL,ZESTRIL) 20 MG TABLET    Take 1 tablet (20 mg total) by mouth daily.   METFORMIN (GLUCOPHAGE) 1000 MG TABLET    Take 1,000 mg by mouth daily with breakfast.     Patient Care Team: Margarita Rana, MD as PCP - General (Family Medicine)  Objective:   Vitals: BP 126/82 mmHg  Pulse 64  Temp(Src) 97.9 F (36.6 C) (Oral)  Resp 16  Ht 5\' 9"  (1.753 m)  Wt 222 lb (100.699 kg)  BMI 32.77 kg/m2   Physical Exam  Constitutional: She is oriented to person, place, and time. She appears well-developed and well-nourished.  HENT:  Head: Normocephalic and atraumatic.  Right Ear: Tympanic membrane, external ear and ear canal normal.  Left Ear: Tympanic membrane, external ear and ear canal normal.  Nose: Nose normal.  Mouth/Throat: Uvula is midline, oropharynx is clear and moist and mucous membranes are normal.  Eyes: Conjunctivae, EOM and lids are normal. Pupils are equal, round, and reactive to light.  Neck: Trachea normal and normal range of  motion. Neck supple. Carotid bruit is not present. No thyroid mass and no thyromegaly present.  Cardiovascular: Normal rate, regular rhythm and normal heart sounds.   Pulmonary/Chest: Effort normal and breath sounds normal.  Abdominal: Soft. Normal appearance and bowel sounds are normal. There is no hepatosplenomegaly. There is no tenderness.  Genitourinary: No breast swelling, tenderness or discharge.  Musculoskeletal: Normal range of motion.  Lymphadenopathy:    She has no cervical adenopathy.    She has no axillary adenopathy.  Neurological: She is alert and oriented to person, place, and time. She has normal strength. No cranial nerve deficit.  Skin: Skin is warm, dry and intact.  Psychiatric: She has a normal mood and affect. Her speech is normal and behavior is normal. Judgment and thought content normal. Cognition and memory are normal.     Depression Screen PHQ 2/9 Scores 10/11/2015  PHQ - 2 Score 0      Assessment & Plan:     Routine Health Maintenance and Physical Exam  Exercise Activities and Dietary recommendations Goals    None      Immunization History  Administered Date(s) Administered  . Influenza-Unspecified 04/08/2015  . Tdap 09/04/2011        1. Annual physical exam Stable. Patient advised to continue eating healthy and exercise daily. - POCT urinalysis dipstick  2. Controlled type 2 diabetes mellitus with diabetic nephropathy, without long-term current use of insulin (HCC) Stable. Patient advised to continue current medication and plan of care. - POCT UA - Microalbumin - Hemoglobin A1c Diabetic Foot Exam - Simple   Simple Foot Form  Diabetic Foot exam was performed with the following findings:  Yes 10/11/2015 10:45 AM  Visual Inspection  No deformities, no ulcerations, no other skin breakdown bilaterally:  Yes  Sensation Testing  Intact to touch and monofilament testing bilaterally:  Yes  Pulse Check  Posterior Tibialis and Dorsalis pulse  intact bilaterally:  Yes  Comments      3. Benign hypertension Stable. Patient advised to continue current medication and plan of care. - CBC with Differential/Platelet - Comprehensive metabolic panel  4. Hypercholesteremia F/U pending lab report. - Lipid Panel With LDL/HDL Ratio - TSH  5. Adult BMI 30+ Encouraged weight loss and exercise.   6. Hematuria Patient advised to keep follow-up with Dr. Bernardo Heater.  Scheduled for stent removal later this week.     Patient seen and examined by Dr. Jerrell Belfast, and note scribed by Philbert Riser. Dimas, CMA.  I have reviewed the document for accuracy and completeness and I agree with above. Jerrell Belfast, MD   Margarita Rana, MD    --------------------------------------------------------------------

## 2015-10-12 LAB — COMPREHENSIVE METABOLIC PANEL
ALT: 12 IU/L (ref 0–32)
AST: 14 IU/L (ref 0–40)
Albumin/Globulin Ratio: 1.6 (ref 1.2–2.2)
Albumin: 4.2 g/dL (ref 3.6–4.8)
Alkaline Phosphatase: 75 IU/L (ref 39–117)
BUN/Creatinine Ratio: 18 (ref 12–28)
BUN: 28 mg/dL — ABNORMAL HIGH (ref 8–27)
Bilirubin Total: 0.3 mg/dL (ref 0.0–1.2)
CO2: 16 mmol/L — ABNORMAL LOW (ref 18–29)
Calcium: 8.7 mg/dL (ref 8.7–10.3)
Chloride: 103 mmol/L (ref 96–106)
Creatinine, Ser: 1.54 mg/dL — ABNORMAL HIGH (ref 0.57–1.00)
GFR calc Af Amer: 41 mL/min/{1.73_m2} — ABNORMAL LOW (ref >59)
GFR calc non Af Amer: 35 mL/min/{1.73_m2} — ABNORMAL LOW (ref >59)
Globulin, Total: 2.6 g/dL (ref 1.5–4.5)
Glucose: 98 mg/dL (ref 65–99)
Potassium: 5.2 mmol/L (ref 3.5–5.2)
Sodium: 137 mmol/L (ref 134–144)
Total Protein: 6.8 g/dL (ref 6.0–8.5)

## 2015-10-12 LAB — CBC WITH DIFFERENTIAL/PLATELET
Basophils Absolute: 0 10*3/uL (ref 0.0–0.2)
Basos: 0 %
EOS (ABSOLUTE): 0.3 10*3/uL (ref 0.0–0.4)
Eos: 3 %
Hematocrit: 30.3 % — ABNORMAL LOW (ref 34.0–46.6)
Hemoglobin: 9.5 g/dL — ABNORMAL LOW (ref 11.1–15.9)
Immature Grans (Abs): 0.1 10*3/uL (ref 0.0–0.1)
Immature Granulocytes: 1 %
Lymphocytes Absolute: 2.4 10*3/uL (ref 0.7–3.1)
Lymphs: 26 %
MCH: 25.7 pg — ABNORMAL LOW (ref 26.6–33.0)
MCHC: 31.4 g/dL — ABNORMAL LOW (ref 31.5–35.7)
MCV: 82 fL (ref 79–97)
Monocytes Absolute: 0.5 10*3/uL (ref 0.1–0.9)
Monocytes: 5 %
Neutrophils Absolute: 5.8 10*3/uL (ref 1.4–7.0)
Neutrophils: 65 %
Platelets: 280 10*3/uL (ref 150–379)
RBC: 3.7 x10E6/uL — ABNORMAL LOW (ref 3.77–5.28)
RDW: 15.5 % — ABNORMAL HIGH (ref 12.3–15.4)
WBC: 9 10*3/uL (ref 3.4–10.8)

## 2015-10-12 LAB — LIPID PANEL WITH LDL/HDL RATIO
Cholesterol, Total: 177 mg/dL (ref 100–199)
HDL: 37 mg/dL — ABNORMAL LOW (ref >39)
LDL Calculated: 90 mg/dL (ref 0–99)
LDl/HDL Ratio: 2.4 ratio units (ref 0.0–3.2)
Triglycerides: 249 mg/dL — ABNORMAL HIGH (ref 0–149)
VLDL Cholesterol Cal: 50 mg/dL — ABNORMAL HIGH (ref 5–40)

## 2015-10-12 LAB — TSH: TSH: 1.73 u[IU]/mL (ref 0.450–4.500)

## 2015-10-13 ENCOUNTER — Other Ambulatory Visit: Payer: Self-pay

## 2015-10-13 ENCOUNTER — Telehealth: Payer: Self-pay

## 2015-10-13 DIAGNOSIS — D649 Anemia, unspecified: Secondary | ICD-10-CM | POA: Insufficient documentation

## 2015-10-13 LAB — IFOBT (OCCULT BLOOD): IFOBT: NEGATIVE

## 2015-10-13 NOTE — Telephone Encounter (Signed)
Advised pt of lab results. Pt verbally acknowledges understanding. Neshawn Aird Drozdowski, CMA   

## 2015-10-13 NOTE — Telephone Encounter (Signed)
-----   Message from Margarita Rana, MD sent at 10/12/2015 10:14 AM EDT ----- Anemic. Please have patient check OC light. Other labs stable. Thanks.

## 2015-10-14 ENCOUNTER — Other Ambulatory Visit: Payer: Self-pay | Admitting: Family Medicine

## 2015-10-15 ENCOUNTER — Telehealth: Payer: Self-pay

## 2015-10-15 DIAGNOSIS — D649 Anemia, unspecified: Secondary | ICD-10-CM

## 2015-10-15 LAB — CBC WITH DIFFERENTIAL/PLATELET
Basophils Absolute: 0.1 10*3/uL (ref 0.0–0.2)
Basos: 1 %
EOS (ABSOLUTE): 0.4 10*3/uL (ref 0.0–0.4)
Eos: 5 %
Hematocrit: 30.2 % — ABNORMAL LOW (ref 34.0–46.6)
Hemoglobin: 9.8 g/dL — ABNORMAL LOW (ref 11.1–15.9)
Immature Grans (Abs): 0.1 10*3/uL (ref 0.0–0.1)
Immature Granulocytes: 1 %
Lymphocytes Absolute: 1.8 10*3/uL (ref 0.7–3.1)
Lymphs: 25 %
MCH: 26.8 pg (ref 26.6–33.0)
MCHC: 32.5 g/dL (ref 31.5–35.7)
MCV: 83 fL (ref 79–97)
Monocytes Absolute: 0.5 10*3/uL (ref 0.1–0.9)
Monocytes: 7 %
Neutrophils Absolute: 4.5 10*3/uL (ref 1.4–7.0)
Neutrophils: 61 %
Platelets: 213 10*3/uL (ref 150–379)
RBC: 3.66 x10E6/uL — ABNORMAL LOW (ref 3.77–5.28)
RDW: 15.7 % — ABNORMAL HIGH (ref 12.3–15.4)
WBC: 7.4 10*3/uL (ref 3.4–10.8)

## 2015-10-15 LAB — IRON AND TIBC
Iron Saturation: 13 % — ABNORMAL LOW (ref 15–55)
Iron: 36 ug/dL (ref 27–139)
Total Iron Binding Capacity: 288 ug/dL (ref 250–450)
UIBC: 252 ug/dL (ref 118–369)

## 2015-10-15 LAB — FERRITIN: Ferritin: 64 ng/mL (ref 15–150)

## 2015-10-15 NOTE — Telephone Encounter (Signed)
-----   Message from Margarita Rana, MD sent at 10/15/2015 10:50 AM EDT ----- Improving. Does have low iron. Would start FeSo4 at 325 mg daily and refer to GI. Thanks.

## 2015-10-15 NOTE — Telephone Encounter (Signed)
Pt advised as directed below.   Thanks,   -Alvina Strother  

## 2015-10-21 LAB — HM DIABETES EYE EXAM

## 2015-11-11 ENCOUNTER — Other Ambulatory Visit: Payer: Self-pay | Admitting: Family Medicine

## 2015-11-11 DIAGNOSIS — E1121 Type 2 diabetes mellitus with diabetic nephropathy: Secondary | ICD-10-CM

## 2015-11-22 ENCOUNTER — Other Ambulatory Visit: Payer: Self-pay | Admitting: Family Medicine

## 2015-11-22 DIAGNOSIS — E1121 Type 2 diabetes mellitus with diabetic nephropathy: Secondary | ICD-10-CM

## 2015-12-07 LAB — HM COLONOSCOPY

## 2015-12-17 ENCOUNTER — Encounter: Payer: Self-pay | Admitting: Family Medicine

## 2015-12-21 ENCOUNTER — Ambulatory Visit (INDEPENDENT_AMBULATORY_CARE_PROVIDER_SITE_OTHER): Payer: BLUE CROSS/BLUE SHIELD | Admitting: Physician Assistant

## 2015-12-21 ENCOUNTER — Encounter: Payer: Self-pay | Admitting: Physician Assistant

## 2015-12-21 VITALS — BP 132/82 | HR 78 | Temp 98.2°F | Resp 16 | Wt 232.2 lb

## 2015-12-21 DIAGNOSIS — D509 Iron deficiency anemia, unspecified: Secondary | ICD-10-CM

## 2015-12-21 DIAGNOSIS — N200 Calculus of kidney: Secondary | ICD-10-CM | POA: Diagnosis not present

## 2015-12-21 DIAGNOSIS — E1121 Type 2 diabetes mellitus with diabetic nephropathy: Secondary | ICD-10-CM

## 2015-12-21 LAB — POCT GLYCOSYLATED HEMOGLOBIN (HGB A1C)
Est. average glucose Bld gHb Est-mCnc: 140
Hemoglobin A1C: 6.5

## 2015-12-21 NOTE — Progress Notes (Signed)
Patient: Debra Paul Female    DOB: [birth year]   65 y.o.   MRN: [id number] Visit Date: 12/21/2015  Today's Provider: Mar Daring, PA-C   Chief Complaint  Patient presents with  . Follow-up    Diabetes   Subjective:    HPI   Diabetes Mellitus Type II, Follow-up:   Lab Results  Component Value Date   HGBA1C 6.5 12/21/2015   HGBA1C 6.8 08/16/2015   HGBA1C 6.3 05/14/2015   Last seen for diabetes 2 months ago.  Management since then includes none. She reports good compliance with treatment. She is not having side effects.  Current symptoms include none Home blood sugar records: fasting range: 170 in the mornings before the endocospy  Episodes of hypoglycemia? no   Current Insulin Regimen: Metformin and Januvia Most Recent Eye Exam: 10/2015 Weight trend: stable Prior visit with dietician: no Current diet: In general healthy most of the time. Current exercise: none  ------------------------------------------------------------------------     Allergies  Allergen Reactions  . Ciprofloxacin    Current Meds  Medication Sig  . ferrous sulfate 325 (65 FE) MG tablet Take by mouth.  Marland Kitchen JANUVIA 50 MG tablet TAKE 1 TABLET BY MOUTH EVERY DAY  . lisinopril (PRINIVIL,ZESTRIL) 20 MG tablet Take 1 tablet (20 mg total) by mouth daily.  . metFORMIN (GLUCOPHAGE) 1000 MG tablet TAKE 1 TABLET BY MOUTH TWICE A DAY  . omeprazole (PRILOSEC) 40 MG capsule Take 40 mg by mouth 2 (two) times daily.  . potassium citrate (UROCIT-K) 10 MEQ (1080 MG) SR tablet Take by mouth.    Review of Systems  Constitutional: Negative.   Respiratory: Negative for cough, chest tightness and shortness of breath.   Cardiovascular: Negative for chest pain, palpitations and leg swelling.  Gastrointestinal: Negative for nausea, vomiting and abdominal pain.  Endocrine: Positive for polydipsia. Negative for polyphagia and polyuria.  Neurological: Negative for weakness and numbness.     Social History  Substance Use Topics  . Smoking status: Never Smoker   . Smokeless tobacco: Never Used  . Alcohol Use: No   Objective:   BP 132/82 mmHg  Pulse 78  Temp(Src) 98.2 F (36.8 C) (Oral)  Resp 16  Wt 232 lb 3.2 oz (105.325 kg)  Physical Exam  Constitutional: She appears well-developed and well-nourished. No distress.  Neck: Normal range of motion. Neck supple.  Cardiovascular: Normal rate, regular rhythm and normal heart sounds.  Exam reveals no gallop and no friction rub.   No murmur heard. Pulmonary/Chest: Effort normal and breath sounds normal. No respiratory distress. She has no wheezes. She has no rales.  Skin: She is not diaphoretic.  Vitals reviewed.     Assessment & Plan:     1. Controlled type 2 diabetes mellitus with diabetic nephropathy, without long-term current use of insulin (HCC) HgBA1c today was 6.5, down from 6.8. Discussed continued diet and adding exercise back again. She voiced understanding. Will see her in 3 months for recheck and to f/u appointments she has upcoming with Dr. Vira Agar and Dr. Bernardo Heater. - POCT glycosylated hemoglobin (Hb A1C)  2. Iron deficiency anemia Found to have gastritis on EGD. Colonoscopy was WNL and to be rechecked in 5 years. She follows up her anemia with Dr. Vira Agar on 01/06/16 and will have labs drawn then.  3. Renal stone Followed by Dr. Bernardo Heater. She returns to see him at the end of July or August. He has her on a potassium citrate tab for  the stone and he is following her potassium level monthly while she is on this. She does have history of elevated potassium.        Mar Daring, PA-C  [facility]

## 2015-12-21 NOTE — Patient Instructions (Signed)

## 2016-01-10 ENCOUNTER — Other Ambulatory Visit: Payer: Self-pay | Admitting: Physician Assistant

## 2016-01-10 DIAGNOSIS — I10 Essential (primary) hypertension: Secondary | ICD-10-CM

## 2016-01-10 MED ORDER — LISINOPRIL 20 MG PO TABS: 20.0000 mg | ORAL_TABLET | Freq: Every day | ORAL | Status: DC

## 2016-03-22 ENCOUNTER — Ambulatory Visit (INDEPENDENT_AMBULATORY_CARE_PROVIDER_SITE_OTHER): Payer: BLUE CROSS/BLUE SHIELD | Admitting: Physician Assistant

## 2016-03-22 ENCOUNTER — Encounter: Payer: Self-pay | Admitting: Physician Assistant

## 2016-03-22 VITALS — BP 142/82 | HR 68 | Temp 97.6°F | Resp 14 | Wt 236.4 lb

## 2016-03-22 DIAGNOSIS — E7800 Pure hypercholesterolemia, unspecified: Secondary | ICD-10-CM

## 2016-03-22 DIAGNOSIS — D509 Iron deficiency anemia, unspecified: Secondary | ICD-10-CM

## 2016-03-22 DIAGNOSIS — E1121 Type 2 diabetes mellitus with diabetic nephropathy: Secondary | ICD-10-CM

## 2016-03-22 DIAGNOSIS — K760 Fatty (change of) liver, not elsewhere classified: Secondary | ICD-10-CM

## 2016-03-22 LAB — POCT GLYCOSYLATED HEMOGLOBIN (HGB A1C): Hemoglobin A1C: 6.9

## 2016-03-22 NOTE — Progress Notes (Signed)
Patient: Debra Paul Female    DOB: 04-Jun-1951   65 y.o.   MRN: [id number] Visit Date: 03/22/2016  Today's Provider: Mar Daring, PA-C   Chief Complaint  Patient presents with  . Diabetes  . Hypertension  . Anemia  . Follow-up   Subjective:    HPI  Diabetes Mellitus Type II, Follow-up:   Lab Results  Component Value Date   HGBA1C 6.9 03/22/2016   HGBA1C 6.5 12/21/2015   HGBA1C 6.8 08/16/2015   Last seen for diabetes 3 months ago.  Management since then includes continue medication. She reports excellent compliance with treatment. She is not having side effects.  Current symptoms include none and have been stable. Home blood sugar records: not been checked in a while    Current Insulin Regimen: none Weight trend: stable Current diet: well balanced Current exercise: none  ------------------------------------------------------------------------   Hypertension, follow-up:  BP Readings from Last 3 Encounters:  03/22/16 (!) 142/82  12/21/15 132/82  10/11/15 126/82    She was last seen for hypertension 3 months ago.  BP at that visit was 132/82. Management since that visit includes continue medication.She reports excellent compliance with treatment. She is not having side effects.  She is not exercising. She is adherent to low salt diet.   Outside blood pressures are not being checked. She is experiencing none.  Patient denies none.   Cardiovascular risk factors include advanced age (older than 17 for men, 15 for women) and diabetes mellitus.  Use of agents associated with hypertension: none.   She will get her flu shot and mammogram through her employer.  ------------------------------------------------------------------------      Previous Medications   ASPIRIN EC 81 MG TABLET    Take 81 mg by mouth. Reported on 12/21/2015   FERROUS SULFATE 325 (65 FE) MG TABLET    Take by mouth.   JANUVIA 50 MG TABLET    TAKE 1 TABLET BY MOUTH EVERY  DAY   LISINOPRIL (PRINIVIL,ZESTRIL) 20 MG TABLET    Take 1 tablet (20 mg total) by mouth daily.   METFORMIN (GLUCOPHAGE) 1000 MG TABLET    TAKE 1 TABLET BY MOUTH TWICE A DAY   OMEPRAZOLE (PRILOSEC) 40 MG CAPSULE    Take 40 mg by mouth 2 (two) times daily.   POTASSIUM CITRATE (UROCIT-K) 10 MEQ (1080 MG) SR TABLET    Take by mouth.    Review of Systems  Constitutional: Negative.   Respiratory: Negative.   Cardiovascular: Negative.   Gastrointestinal: Negative.   Endocrine: Negative.   Musculoskeletal: Negative.     Social History  Substance Use Topics  . Smoking status: Never Smoker  . Smokeless tobacco: Never Used  . Alcohol use No   Objective:   BP (!) 142/82 (BP Location: Right Arm, Patient Position: Sitting, Cuff Size: Large)   Pulse 68   Temp 97.6 F (36.4 C) (Oral)   Resp 14   Wt 236 lb 6.4 oz (107.2 kg)   SpO2 96%   BMI 34.91 kg/m   Physical Exam  Constitutional: She appears well-developed and well-nourished. No distress.  Cardiovascular: Normal rate, regular rhythm and normal heart sounds.  Exam reveals no gallop and no friction rub.   No murmur heard. Pulmonary/Chest: Effort normal and breath sounds normal. No respiratory distress. She has no wheezes. She has no rales.  Musculoskeletal: She exhibits no edema.  Skin: She is not diaphoretic.  Psychiatric: She has a normal mood and affect. Her behavior is normal.  Vitals reviewed.     Assessment & Plan:     1. Controlled type 2 diabetes mellitus with diabetic nephropathy, without long-term current use of insulin (HCC) HgBA1c increased to 6.9. Patient reports she did eat a lot of watermelon this summer. She is to continue metformin and Januvia. I will see her back in 6 months for recheck.  - POCT HgB A1C  2. Hypercholesteremia Diet controlled. Will check labs and f/u pending labs.  - Lipid Profile  3. Iron deficiency anemia H/O this. Currently on ferrous sulfate supplement. Holding ASA. Will check labs and  f/u pending results.  - CBC with Differential - Iron and TIBC  4. Fatty infiltration of liver Dieting. Will check labs as below and f/u pending results. - Comprehensive Metabolic Panel (CMET)   Follow up: Return in about 6 months (around 09/19/2016) for HTN, T2DM.

## 2016-03-22 NOTE — Patient Instructions (Signed)

## 2016-03-23 ENCOUNTER — Telehealth: Payer: Self-pay

## 2016-03-23 LAB — COMPREHENSIVE METABOLIC PANEL
ALT: 25 IU/L (ref 0–32)
AST: 21 IU/L (ref 0–40)
Albumin/Globulin Ratio: 1.6 (ref 1.2–2.2)
Albumin: 4.3 g/dL (ref 3.6–4.8)
Alkaline Phosphatase: 97 IU/L (ref 39–117)
BUN/Creatinine Ratio: 18 (ref 12–28)
BUN: 39 mg/dL — ABNORMAL HIGH (ref 8–27)
Bilirubin Total: 0.2 mg/dL (ref 0.0–1.2)
CO2: 21 mmol/L (ref 18–29)
Calcium: 8.7 mg/dL (ref 8.7–10.3)
Chloride: 104 mmol/L (ref 96–106)
Creatinine, Ser: 2.21 mg/dL — ABNORMAL HIGH (ref 0.57–1.00)
GFR calc Af Amer: 26 mL/min/{1.73_m2} — ABNORMAL LOW (ref >59)
GFR calc non Af Amer: 23 mL/min/{1.73_m2} — ABNORMAL LOW (ref >59)
Globulin, Total: 2.7 g/dL (ref 1.5–4.5)
Glucose: 131 mg/dL — ABNORMAL HIGH (ref 65–99)
Potassium: 5.3 mmol/L — ABNORMAL HIGH (ref 3.5–5.2)
Sodium: 140 mmol/L (ref 134–144)
Total Protein: 7 g/dL (ref 6.0–8.5)

## 2016-03-23 LAB — LIPID PANEL
Chol/HDL Ratio: 5.8 ratio units — ABNORMAL HIGH (ref 0.0–4.4)
Cholesterol, Total: 198 mg/dL (ref 100–199)
HDL: 34 mg/dL — ABNORMAL LOW (ref >39)
LDL Calculated: 95 mg/dL (ref 0–99)
Triglycerides: 346 mg/dL — ABNORMAL HIGH (ref 0–149)
VLDL Cholesterol Cal: 69 mg/dL — ABNORMAL HIGH (ref 5–40)

## 2016-03-23 LAB — IRON AND TIBC
Iron Saturation: 13 % — ABNORMAL LOW (ref 15–55)
Iron: 44 ug/dL (ref 27–139)
Total Iron Binding Capacity: 338 ug/dL (ref 250–450)
UIBC: 294 ug/dL (ref 118–369)

## 2016-03-23 LAB — CBC WITH DIFFERENTIAL/PLATELET
Basophils Absolute: 0 10*3/uL (ref 0.0–0.2)
Basos: 0 %
EOS (ABSOLUTE): 0.2 10*3/uL (ref 0.0–0.4)
Eos: 3 %
Hematocrit: 33.1 % — ABNORMAL LOW (ref 34.0–46.6)
Hemoglobin: 10.6 g/dL — ABNORMAL LOW (ref 11.1–15.9)
Immature Grans (Abs): 0.1 10*3/uL (ref 0.0–0.1)
Immature Granulocytes: 1 %
Lymphocytes Absolute: 1.6 10*3/uL (ref 0.7–3.1)
Lymphs: 22 %
MCH: 25.9 pg — ABNORMAL LOW (ref 26.6–33.0)
MCHC: 32 g/dL (ref 31.5–35.7)
MCV: 81 fL (ref 79–97)
Monocytes Absolute: 0.5 10*3/uL (ref 0.1–0.9)
Monocytes: 6 %
Neutrophils Absolute: 5 10*3/uL (ref 1.4–7.0)
Neutrophils: 68 %
Platelets: 178 10*3/uL (ref 150–379)
RBC: 4.09 x10E6/uL (ref 3.77–5.28)
RDW: 15.1 % (ref 12.3–15.4)
WBC: 7.4 10*3/uL (ref 3.4–10.8)

## 2016-03-23 NOTE — Telephone Encounter (Signed)
Left message to call back  

## 2016-03-23 NOTE — Telephone Encounter (Signed)
Advised patient of results.  

## 2016-03-23 NOTE — Telephone Encounter (Signed)
-----   Message from Mar Daring, PA-C sent at 03/23/2016  9:09 AM EDT ----- HgB has improved to 10.6. Iron is still low so continue ferrous sulfate OTC. Triglycerides have increased back to 346. Really focus on trying to eat healthy increasing vegetables and limiting fatty foods and carbs from diet. Try to add light physical activity as well and increase as tolerated.

## 2016-04-25 ENCOUNTER — Other Ambulatory Visit: Payer: Self-pay | Admitting: Physician Assistant

## 2016-04-25 DIAGNOSIS — Z1231 Encounter for screening mammogram for malignant neoplasm of breast: Secondary | ICD-10-CM

## 2016-05-07 ENCOUNTER — Other Ambulatory Visit: Payer: Self-pay | Admitting: Family Medicine

## 2016-05-07 DIAGNOSIS — E1121 Type 2 diabetes mellitus with diabetic nephropathy: Secondary | ICD-10-CM

## 2016-05-10 ENCOUNTER — Ambulatory Visit
Admission: RE | Admit: 2016-05-10 | Discharge: 2016-05-10 | Disposition: A | Discharge status: Home / Self Care | Payer: BLUE CROSS/BLUE SHIELD | Source: Ambulatory Visit | Attending: Physician Assistant | Admitting: Physician Assistant

## 2016-05-10 DIAGNOSIS — Z1231 Encounter for screening mammogram for malignant neoplasm of breast: Secondary | ICD-10-CM | POA: Diagnosis not present

## 2016-05-11 ENCOUNTER — Telehealth: Payer: Self-pay

## 2016-05-11 NOTE — Telephone Encounter (Signed)
-----   Message from Mar Daring, PA-C sent at 05/11/2016  8:27 AM EST ----- Normal mammogram. Repeat screening in one year.

## 2016-05-11 NOTE — Telephone Encounter (Signed)
Pt advised.   Thanks,   -Hoy Fallert  

## 2016-05-19 ENCOUNTER — Other Ambulatory Visit: Payer: Self-pay | Admitting: Family Medicine

## 2016-05-19 DIAGNOSIS — E1121 Type 2 diabetes mellitus with diabetic nephropathy: Secondary | ICD-10-CM

## 2016-05-19 NOTE — Telephone Encounter (Signed)
Please review-aa 

## 2016-08-21 DIAGNOSIS — R82991 Hypocitraturia: Secondary | ICD-10-CM | POA: Insufficient documentation

## 2016-08-26 ENCOUNTER — Other Ambulatory Visit: Payer: Self-pay | Admitting: Physician Assistant

## 2016-08-26 DIAGNOSIS — I10 Essential (primary) hypertension: Secondary | ICD-10-CM

## 2016-09-19 ENCOUNTER — Ambulatory Visit (INDEPENDENT_AMBULATORY_CARE_PROVIDER_SITE_OTHER): Payer: BLUE CROSS/BLUE SHIELD | Admitting: Physician Assistant

## 2016-09-19 ENCOUNTER — Encounter: Payer: Self-pay | Admitting: Physician Assistant

## 2016-09-19 VITALS — BP 116/86 | HR 80 | Temp 97.6°F | Resp 16 | Ht 69.0 in | Wt 236.6 lb

## 2016-09-19 DIAGNOSIS — L309 Dermatitis, unspecified: Secondary | ICD-10-CM | POA: Diagnosis not present

## 2016-09-19 DIAGNOSIS — I10 Essential (primary) hypertension: Secondary | ICD-10-CM | POA: Diagnosis not present

## 2016-09-19 DIAGNOSIS — E1121 Type 2 diabetes mellitus with diabetic nephropathy: Secondary | ICD-10-CM

## 2016-09-19 LAB — POCT GLYCOSYLATED HEMOGLOBIN (HGB A1C)
Est. average glucose Bld gHb Est-mCnc: 148
Hemoglobin A1C: 6.8

## 2016-09-19 MED ORDER — TRIAMCINOLONE ACETONIDE 0.1 % EX CREA: 1.0000 "application " | TOPICAL_CREAM | Freq: Two times a day (BID) | CUTANEOUS | 0 refills | Status: DC

## 2016-09-19 NOTE — Progress Notes (Signed)
Patient: Debra Paul Female    DOB: [birth year]   66 y.o.   MRN: [id number] Visit Date: 09/19/2016  Today's Provider: Mar Daring, PA-C   Chief Complaint  Patient presents with  . Diabetes  . Hypertension  . Follow-up  . Rash   Subjective:    HPI  Diabetes Mellitus Type II, Follow-up:   Lab Results  Component Value Date   HGBA1C 6.8 09/19/2016   HGBA1C 6.9 03/22/2016   HGBA1C 6.5 12/21/2015    Last seen for diabetes 6 months ago.  Management since then includes no changes. She reports excellent compliance with treatment. She is not having side effects.  Current symptoms include none and have been stable. Home blood sugar records: fasting range: 150's  Episodes of hypoglycemia? no   Current Insulin Regimen: none Most Recent Eye Exam: UTD Weight trend: stable Prior visit with dietician: no Current diet: in general, an "unhealthy" diet Current exercise: none  Pertinent Labs:    Component Value Date/Time   CHOL 198 03/22/2016 0857   TRIG 346 (H) 03/22/2016 0857   HDL 34 (L) 03/22/2016 0857   LDLCALC 95 03/22/2016 0857   CREATININE 2.21 (H) 03/22/2016 0857    Wt Readings from Last 3 Encounters:  09/19/16 236 lb 9.6 oz (107.3 kg)  03/22/16 236 lb 6.4 oz (107.2 kg)  12/21/15 232 lb 3.2 oz (105.3 kg)    ------------------------------------------------------------------------   Hypertension, follow-up:  BP Readings from Last 3 Encounters:  09/19/16 116/86  03/22/16 (!) 142/82  12/21/15 132/82    She was last seen for hypertension 6 months ago.  BP at that visit was 116/86. Management changes since that visit include check labs. She reports excellent compliance with treatment. She is not having side effects.  She is not exercising. She is not adherent to low salt diet.   Outside blood pressures are not being checked. She is experiencing none.  Patient denies chest pain.   Cardiovascular risk factors include diabetes  mellitus, hypertension and obesity (BMI >= 30 kg/m2).  Use of agents associated with hypertension: none.     Weight trend: stable Wt Readings from Last 3 Encounters:  09/19/16 236 lb 9.6 oz (107.3 kg)  03/22/16 236 lb 6.4 oz (107.2 kg)  12/21/15 232 lb 3.2 oz (105.3 kg)    Current diet: in general, an "unhealthy" diet  ------------------------------------------------------------------------   Follow up for labs  The patient was last seen for this 6 months ago. Changes made at last visit include patient advised to continue working on healthy lifestyle changes.  She reports fair compliance with treatment. She feels that condition is Unchanged. ------------------------------------------------------------------------------------  Patient C/O rash on her upper back x's 1 week. Patient is concerned it may be shingles.      Allergies  Allergen Reactions  . Ciprofloxacin      Current Outpatient Prescriptions:  .  ferrous sulfate 325 (65 FE) MG tablet, Take by mouth., Disp: , Rfl:  .  JANUVIA 50 MG tablet, TAKE 1 TABLET BY MOUTH EVERY DAY, Disp: 30 tablet, Rfl: 5 .  lisinopril (PRINIVIL,ZESTRIL) 20 MG tablet, TAKE 1 TABLET (20 MG TOTAL) BY MOUTH DAILY., Disp: 90 tablet, Rfl: 1 .  metFORMIN (GLUCOPHAGE) 1000 MG tablet, TAKE 1 TABLET BY MOUTH TWICE A DAY, Disp: 180 tablet, Rfl: 1 .  potassium citrate (UROCIT-K) 10 MEQ (1080 MG) SR tablet, Take by mouth., Disp: , Rfl:  .  ranitidine (ZANTAC) 150 MG tablet, Take 150 mg  by mouth 2 (two) times daily., Disp: , Rfl: 3  Review of Systems  Constitutional: Negative.   HENT: Negative.   Respiratory: Negative.   Cardiovascular: Negative.   Gastrointestinal: Negative.   Endocrine: Negative.   Musculoskeletal: Negative.   Skin: Positive for rash.  Neurological: Negative.   Psychiatric/Behavioral: Negative.     Social History  Substance Use Topics  . Smoking status: Never Smoker  . Smokeless tobacco: Never Used  . Alcohol use No    Objective:   BP 116/86 (BP Location: Left Arm, Patient Position: Sitting, Cuff Size: Large)   Pulse 80   Temp 97.6 F (36.4 C) (Oral)   Resp 16   Ht 5\' 9"  (1.753 m)   Wt 236 lb 9.6 oz (107.3 kg)   BMI 34.94 kg/m  Vitals:   09/19/16 0818  BP: 116/86  Pulse: 80  Resp: 16  Temp: 97.6 F (36.4 C)  TempSrc: Oral  Weight: 236 lb 9.6 oz (107.3 kg)  Height: 5\' 9"  (1.753 m)     Physical Exam  Constitutional: She appears well-developed and well-nourished. No distress.  Neck: Normal range of motion. Neck supple. No JVD present. No tracheal deviation present. No thyromegaly present.  Cardiovascular: Normal rate, regular rhythm and normal heart sounds.  Exam reveals no gallop and no friction rub.   No murmur heard. Pulmonary/Chest: Effort normal and breath sounds normal. No respiratory distress. She has no wheezes. She has no rales.  Musculoskeletal: She exhibits no edema.  Lymphadenopathy:    She has no cervical adenopathy.  Skin: Rash noted. Rash is macular. She is not diaphoretic.     Vitals reviewed.       Assessment & Plan:     1. Controlled type 2 diabetes mellitus with diabetic nephropathy, without long-term current use of insulin (HCC) A1c improved to 6.8. Continue Metformin 1000mg  BID and Januvia 50mg  daily. She will return next month for her welcome to medicare. - POCT glycosylated hemoglobin (Hb A1C)  2. Benign hypertension Improved. Continue lisinopril 20mg  daily. Will check labs next month.  3. Eczema, unspecified type Rash c/w eczema. Patient states she has eczema on her legsa occasionally as well. Triamcinolone given as below.  - triamcinolone cream (KENALOG) 0.1 %; Apply 1 application topically 2 (two) times daily.  Dispense: 30 g; Refill: 0       Mar Daring, PA-C  [facility]

## 2016-09-19 NOTE — Patient Instructions (Signed)

## 2016-10-12 ENCOUNTER — Encounter: Payer: Self-pay | Admitting: Physician Assistant

## 2016-10-12 ENCOUNTER — Ambulatory Visit (INDEPENDENT_AMBULATORY_CARE_PROVIDER_SITE_OTHER): Payer: BLUE CROSS/BLUE SHIELD | Admitting: Physician Assistant

## 2016-10-12 VITALS — BP 140/80 | HR 80 | Temp 97.8°F | Resp 16 | Ht 69.0 in | Wt 235.2 lb

## 2016-10-12 DIAGNOSIS — Z1211 Encounter for screening for malignant neoplasm of colon: Secondary | ICD-10-CM | POA: Diagnosis not present

## 2016-10-12 DIAGNOSIS — Z780 Asymptomatic menopausal state: Secondary | ICD-10-CM

## 2016-10-12 DIAGNOSIS — Z114 Encounter for screening for human immunodeficiency virus [HIV]: Secondary | ICD-10-CM

## 2016-10-12 DIAGNOSIS — Z1231 Encounter for screening mammogram for malignant neoplasm of breast: Secondary | ICD-10-CM | POA: Diagnosis not present

## 2016-10-12 DIAGNOSIS — E119 Type 2 diabetes mellitus without complications: Secondary | ICD-10-CM

## 2016-10-12 DIAGNOSIS — R195 Other fecal abnormalities: Secondary | ICD-10-CM

## 2016-10-12 DIAGNOSIS — Z1159 Encounter for screening for other viral diseases: Secondary | ICD-10-CM

## 2016-10-12 DIAGNOSIS — E7800 Pure hypercholesterolemia, unspecified: Secondary | ICD-10-CM | POA: Diagnosis not present

## 2016-10-12 DIAGNOSIS — Z0000 Encounter for general adult medical examination without abnormal findings: Secondary | ICD-10-CM

## 2016-10-12 DIAGNOSIS — Z23 Encounter for immunization: Secondary | ICD-10-CM

## 2016-10-12 DIAGNOSIS — Z1239 Encounter for other screening for malignant neoplasm of breast: Secondary | ICD-10-CM

## 2016-10-12 DIAGNOSIS — Z13820 Encounter for screening for osteoporosis: Secondary | ICD-10-CM

## 2016-10-12 DIAGNOSIS — Z124 Encounter for screening for malignant neoplasm of cervix: Secondary | ICD-10-CM | POA: Diagnosis not present

## 2016-10-12 DIAGNOSIS — Z86010 Personal history of colonic polyps: Secondary | ICD-10-CM

## 2016-10-12 NOTE — Patient Instructions (Signed)
Health Maintenance for Postmenopausal Women Menopause is a normal process in which your reproductive ability comes to an end. This process happens gradually over a span of months to years, usually between the ages of 33 and 38. Menopause is complete when you have missed 12 consecutive menstrual periods. It is important to talk with your health care provider about some of the most common conditions that affect postmenopausal women, such as heart disease, cancer, and bone loss (osteoporosis). Adopting a healthy lifestyle and getting preventive care can help to promote your health and wellness. Those actions can also lower your chances of developing some of these common conditions. What should I know about menopause? During menopause, you may experience a number of symptoms, such as:  Moderate-to-severe hot flashes.  Night sweats.  Decrease in sex drive.  Mood swings.  Headaches.  Tiredness.  Irritability.  Memory problems.  Insomnia. Choosing to treat or not to treat menopausal changes is an individual decision that you make with your health care provider. What should I know about hormone replacement therapy and supplements? Hormone therapy products are effective for treating symptoms that are associated with menopause, such as hot flashes and night sweats. Hormone replacement carries certain risks, especially as you become older. If you are thinking about using estrogen or estrogen with progestin treatments, discuss the benefits and risks with your health care provider. What should I know about heart disease and stroke? Heart disease, heart attack, and stroke become more likely as you age. This may be due, in part, to the hormonal changes that your body experiences during menopause. These can affect how your body processes dietary fats, triglycerides, and cholesterol. Heart attack and stroke are both medical emergencies. There are many things that you can do to help prevent heart disease  and stroke:  Have your blood pressure checked at least every 1-2 years. High blood pressure causes heart disease and increases the risk of stroke.  If you are 48-61 years old, ask your health care provider if you should take aspirin to prevent a heart attack or a stroke.  Do not use any tobacco products, including cigarettes, chewing tobacco, or electronic cigarettes. If you need help quitting, ask your health care provider.  It is important to eat a healthy diet and maintain a healthy weight.  Be sure to include plenty of vegetables, fruits, low-fat dairy products, and lean protein.  Avoid eating foods that are high in solid fats, added sugars, or salt (sodium).  Get regular exercise. This is one of the most important things that you can do for your health.  Try to exercise for at least 150 minutes each week. The type of exercise that you do should increase your heart rate and make you sweat. This is known as moderate-intensity exercise.  Try to do strengthening exercises at least twice each week. Do these in addition to the moderate-intensity exercise.  Know your numbers.Ask your health care provider to check your cholesterol and your blood glucose. Continue to have your blood tested as directed by your health care provider. What should I know about cancer screening? There are several types of cancer. Take the following steps to reduce your risk and to catch any cancer development as early as possible. Breast Cancer  Practice breast self-awareness.  This means understanding how your breasts normally appear and feel.  It also means doing regular breast self-exams. Let your health care provider know about any changes, no matter how small.  If you are 40 or older,  have a clinician do a breast exam (clinical breast exam or CBE) every year. Depending on your age, family history, and medical history, it may be recommended that you also have a yearly breast X-ray (mammogram).  If you  have a family history of breast cancer, talk with your health care provider about genetic screening.  If you are at high risk for breast cancer, talk with your health care provider about having an MRI and a mammogram every year.  Breast cancer (BRCA) gene test is recommended for women who have family members with BRCA-related cancers. Results of the assessment will determine the need for genetic counseling and BRCA1 and for BRCA2 testing. BRCA-related cancers include these types:  Breast. This occurs in males or females.  Ovarian.  Tubal. This may also be called fallopian tube cancer.  Cancer of the abdominal or pelvic lining (peritoneal cancer).  Prostate.  Pancreatic. Cervical, Uterine, and Ovarian Cancer  Your health care provider may recommend that you be screened regularly for cancer of the pelvic organs. These include your ovaries, uterus, and vagina. This screening involves a pelvic exam, which includes checking for microscopic changes to the surface of your cervix (Pap test).  For women ages 21-65, health care providers may recommend a pelvic exam and a Pap test every three years. For women ages 23-65, they may recommend the Pap test and pelvic exam, combined with testing for human papilloma virus (HPV), every five years. Some types of HPV increase your risk of cervical cancer. Testing for HPV may also be done on women of any age who have unclear Pap test results.  Other health care providers may not recommend any screening for nonpregnant women who are considered low risk for pelvic cancer and have no symptoms. Ask your health care provider if a screening pelvic exam is right for you.  If you have had past treatment for cervical cancer or a condition that could lead to cancer, you need Pap tests and screening for cancer for at least 20 years after your treatment. If Pap tests have been discontinued for you, your risk factors (such as having a new sexual partner) need to be reassessed  to determine if you should start having screenings again. Some women have medical problems that increase the chance of getting cervical cancer. In these cases, your health care provider may recommend that you have screening and Pap tests more often.  If you have a family history of uterine cancer or ovarian cancer, talk with your health care provider about genetic screening.  If you have vaginal bleeding after reaching menopause, tell your health care provider.  There are currently no reliable tests available to screen for ovarian cancer. Lung Cancer  Lung cancer screening is recommended for adults 99-83 years old who are at high risk for lung cancer because of a history of smoking. A yearly low-dose CT scan of the lungs is recommended if you:  Currently smoke.  Have a history of at least 30 pack-years of smoking and you currently smoke or have quit within the past 15 years. A pack-year is smoking an average of one pack of cigarettes per day for one year. Yearly screening should:  Continue until it has been 15 years since you quit.  Stop if you develop a health problem that would prevent you from having lung cancer treatment. Colorectal Cancer  This type of cancer can be detected and can often be prevented.  Routine colorectal cancer screening usually begins at age 72 and continues  through age 75.  If you have risk factors for colon cancer, your health care provider may recommend that you be screened at an earlier age.  If you have a family history of colorectal cancer, talk with your health care provider about genetic screening.  Your health care provider may also recommend using home test kits to check for hidden blood in your stool.  A small camera at the end of a tube can be used to examine your colon directly (sigmoidoscopy or colonoscopy). This is done to check for the earliest forms of colorectal cancer.  Direct examination of the colon should be repeated every 5-10 years until  age 75. However, if early forms of precancerous polyps or small growths are found or if you have a family history or genetic risk for colorectal cancer, you may need to be screened more often. Skin Cancer  Check your skin from head to toe regularly.  Monitor any moles. Be sure to tell your health care provider:  About any new moles or changes in moles, especially if there is a change in a mole's shape or color.  If you have a mole that is larger than the size of a pencil eraser.  If any of your family members has a history of skin cancer, especially at a young age, talk with your health care provider about genetic screening.  Always use sunscreen. Apply sunscreen liberally and repeatedly throughout the day.  Whenever you are outside, protect yourself by wearing long sleeves, pants, a wide-brimmed hat, and sunglasses. What should I know about osteoporosis? Osteoporosis is a condition in which bone destruction happens more quickly than new bone creation. After menopause, you may be at an increased risk for osteoporosis. To help prevent osteoporosis or the bone fractures that can happen because of osteoporosis, the following is recommended:  If you are 19-50 years old, get at least 1,000 mg of calcium and at least 600 mg of vitamin D per day.  If you are older than age 50 but younger than age 70, get at least 1,200 mg of calcium and at least 600 mg of vitamin D per day.  If you are older than age 70, get at least 1,200 mg of calcium and at least 800 mg of vitamin D per day. Smoking and excessive alcohol intake increase the risk of osteoporosis. Eat foods that are rich in calcium and vitamin D, and do weight-bearing exercises several times each week as directed by your health care provider. What should I know about how menopause affects my mental health? Depression may occur at any age, but it is more common as you become older. Common symptoms of depression include:  Low or sad  mood.  Changes in sleep patterns.  Changes in appetite or eating patterns.  Feeling an overall lack of motivation or enjoyment of activities that you previously enjoyed.  Frequent crying spells. Talk with your health care provider if you think that you are experiencing depression. What should I know about immunizations? It is important that you get and maintain your immunizations. These include:  Tetanus, diphtheria, and pertussis (Tdap) booster vaccine.  Influenza every year before the flu season begins.  Pneumonia vaccine.  Shingles vaccine. Your health care provider may also recommend other immunizations. This information is not intended to replace advice given to you by your health care provider. Make sure you discuss any questions you have with your health care provider. Document Released: 08/11/2005 Document Revised: 01/07/2016 Document Reviewed: 03/23/2015 [employer] Interactive Patient   Education  2017 [employer]  

## 2016-10-12 NOTE — Progress Notes (Signed)
Patient: Debra Paul, Female    DOB: 01-06-1951, 66 y.o.   MRN: [id number] Visit Date: 10/12/2016  Today's Provider: Mar Daring, PA-C   Chief Complaint  Patient presents with  . Annual Exam   Subjective:    Annual physical exam Rebecca Cairns is a 66 y.o. female who presents today for health maintenance and complete physical. She feels well. She reports exercising. She reports she is sleeping well.  CPE:10/11/15 Pap:09/04/11 neg, HPV neg Mammogram: 05/10/16 BI-RADS 1 Colonoscopy:11/25/13 Tubular Adenoma -----------------------------------------------------------------   Review of Systems  Constitutional: Negative.   HENT: Positive for congestion.   Eyes: Negative.   Respiratory: Negative.   Cardiovascular: Negative.   Gastrointestinal: Negative.   Endocrine: Negative.   Genitourinary: Negative.   Musculoskeletal: Negative.   Skin: Negative.   Allergic/Immunologic: Negative.   Neurological: Negative.   Hematological: Negative.   Psychiatric/Behavioral: Negative.     Social History      She  reports that she has never smoked. She has never used smokeless tobacco. She reports that she does not drink alcohol or use drugs.       Social History   Social History  . Marital status: Single    Spouse name: N/A  . Number of children: N/A  . Years of education: N/A   Social History Main Topics  . Smoking status: Never Smoker  . Smokeless tobacco: Never Used  . Alcohol use No     Comment: 2-3times a year  . Drug use: No  . Sexual activity: Not Asked   Other Topics Concern  . None   Social History Narrative  . None    History reviewed. No pertinent past medical history.   Patient Active Problem List   Diagnosis Date Noted  . Anemia 10/13/2015  . Hematuria 10/11/2015  . Calculus of kidney 02/10/2015  . Benign hypertension 11/23/2014  . Adult BMI 30+ 11/23/2014  . Chronic kidney disease (CKD), stage III (moderate) 11/23/2014   . Colon polyp 11/23/2014  . Controlled type 2 diabetes mellitus with diabetic nephropathy ([place]) 11/23/2014  . Fatty infiltration of liver 11/23/2014  . Herpes zona 11/23/2014  . Hypercholesteremia 11/23/2014  . Disordered sleep 11/23/2014  . Essential (primary) hypertension 11/23/2014    Past Surgical History:  Procedure Laterality Date  . CYSTOSCOPY WITH URETEROSCOPY AND STENT PLACEMENT  09/29/2015   Dr. Bernardo Heater  . LITHOTRIPSY  2002  . PARATHYROIDECTOMY  1990    Family History        Family Status  Relation Status  . Father Deceased at age 30s  . Mother Alive  . Sister Alive  . Brother Alive  . Son Alive  . Brother Alive        Her family history includes Colon cancer in her father; Heart disease in her mother.     Allergies  Allergen Reactions  . Ciprofloxacin      Current Outpatient Prescriptions:  .  ferrous sulfate 325 (65 FE) MG tablet, Take by mouth., Disp: , Rfl:  .  JANUVIA 50 MG tablet, TAKE 1 TABLET BY MOUTH EVERY DAY, Disp: 30 tablet, Rfl: 5 .  lisinopril (PRINIVIL,ZESTRIL) 20 MG tablet, TAKE 1 TABLET (20 MG TOTAL) BY MOUTH DAILY., Disp: 90 tablet, Rfl: 1 .  metFORMIN (GLUCOPHAGE) 1000 MG tablet, TAKE 1 TABLET BY MOUTH TWICE A DAY, Disp: 180 tablet, Rfl: 1 .  potassium citrate (UROCIT-K) 10 MEQ (1080 MG) SR tablet, Take by mouth., Disp: , Rfl:  .  ranitidine (ZANTAC) 150 MG tablet, Take 150 mg by mouth 2 (two) times daily., Disp: , Rfl: 3 .  triamcinolone cream (KENALOG) 0.1 %, Apply 1 application topically 2 (two) times daily. (Patient not taking: Reported on 10/12/2016), Disp: 30 g, Rfl: 0   Patient Care Team: Mar Daring, PA-C as PCP - General (Family Medicine)      Objective:   Vitals: BP 140/80 (BP Location: Right Arm, Patient Position: Sitting, Cuff Size: Normal)   Pulse 80   Temp 97.8 F (36.6 C) (Oral)   Resp 16   Ht 5\' 9"  (1.753 m)   Wt 235 lb 3.2 oz (106.7 kg)   BMI 34.73 kg/m    Vitals:   10/12/16 0919  BP: 140/80  Pulse:  80  Resp: 16  Temp: 97.8 F (36.6 C)  TempSrc: Oral  Weight: 235 lb 3.2 oz (106.7 kg)  Height: 5\' 9"  (1.753 m)     Physical Exam  Constitutional: She is oriented to person, place, and time. She appears well-developed and well-nourished. No distress.  HENT:  Head: Normocephalic and atraumatic.  Right Ear: Hearing, tympanic membrane, external ear and ear canal normal.  Left Ear: Hearing, tympanic membrane, external ear and ear canal normal.  Nose: Nose normal.  Mouth/Throat: Uvula is midline, oropharynx is clear and moist and mucous membranes are normal. No oropharyngeal exudate.  Eyes: Conjunctivae and EOM are normal. Pupils are equal, round, and reactive to light. Right eye exhibits no discharge. Left eye exhibits no discharge. No scleral icterus.  Neck: Normal range of motion. Neck supple. No JVD present. Carotid bruit is not present. No tracheal deviation present. No thyromegaly present.  Cardiovascular: Normal rate, regular rhythm, normal heart sounds and intact distal pulses.  Exam reveals no gallop and no friction rub.   No murmur heard. Pulmonary/Chest: Effort normal and breath sounds normal. No respiratory distress. She has no wheezes. She has no rales. She exhibits no tenderness. Right breast exhibits no inverted nipple, no mass, no nipple discharge, no skin change and no tenderness. Left breast exhibits no inverted nipple, no mass, no nipple discharge, no skin change and no tenderness. Breasts are symmetrical.  Abdominal: Soft. Bowel sounds are normal. She exhibits no distension and no mass. There is no tenderness. There is no rebound and no guarding. Hernia confirmed negative in the right inguinal area and confirmed negative in the left inguinal area.  Genitourinary: Vagina normal and uterus normal. Rectal exam shows guaiac positive stool. Rectal exam shows no external hemorrhoid, no internal hemorrhoid, no fissure and no tenderness. No breast swelling, tenderness, discharge or  bleeding. Pelvic exam was performed with patient supine. There is no rash, tenderness, lesion or injury on the right labia. There is no rash, tenderness, lesion or injury on the left labia. Cervix exhibits no motion tenderness, no discharge and no friability. Right adnexum displays no mass, no tenderness and no fullness. Left adnexum displays no mass, no tenderness and no fullness. No erythema, tenderness or bleeding in the vagina. No signs of injury around the vagina. No vaginal discharge found.  Musculoskeletal: Normal range of motion. She exhibits no edema or tenderness.  Lymphadenopathy:    She has no cervical adenopathy.       Right: No inguinal adenopathy present.       Left: No inguinal adenopathy present.  Neurological: She is alert and oriented to person, place, and time. She has normal reflexes. No cranial nerve deficit. Coordination normal.  Skin: Skin is warm and dry.  No rash noted. She is not diaphoretic.  Psychiatric: She has a normal mood and affect. Her behavior is normal. Judgment and thought content normal.  Vitals reviewed.    Depression Screen PHQ 2/9 Scores 10/12/2016 10/11/2015  PHQ - 2 Score 0 0  PHQ- 9 Score 0 -      Assessment & Plan:     Routine Health Maintenance and Physical Exam  Exercise Activities and Dietary recommendations Goals    None      Immunization History  Administered Date(s) Administered  . Influenza-Unspecified 04/08/2015  . Tdap 09/04/2011    Health Maintenance  Topic Date Due  . Hepatitis C Screening  1951-04-08  . HIV Screening  10/05/1966  . PAP SMEAR  09/04/2014  . DEXA SCAN  10/04/2016  . PNA vac Low Risk Adult (1 of 2 - PCV13) 10/04/2016  . FOOT EXAM  10/10/2016  . OPHTHALMOLOGY EXAM  10/20/2016  . INFLUENZA VACCINE  01/31/2017  . HEMOGLOBIN A1C  03/22/2017  . MAMMOGRAM  05/10/2018  . TETANUS/TDAP  09/03/2021  . COLONOSCOPY  11/26/2023     Discussed health benefits of physical activity, and encouraged her to engage  in regular exercise appropriate for her age and condition.    1. Annual physical exam Normal physical exam today. Will check labs as below and f/u pending lab results. If labs are stable and WNL she will not need to have these rechecked for one year at her next annual physical exam. She is to call the office in the meantime if she has any acute issue, questions or concerns. - CBC with Differential/Platelet - Comprehensive metabolic panel - TSH  2. Breast cancer screening Breast exam today was normal. There is no family history of breast cancer. She does perform regular self breast exams. Mammogram will be done through her employer.  3. Colon cancer screening OC lite collected today and was positive. She does have h/o tubular adenomas on colonoscopy and is followed by Dr. Vira Agar. Will refer her back to Dr. Vira Agar for further evaluation. - IFOBT POC (occult bld, rslt in office); Future - Ambulatory referral to Gastroenterology  4. Cervical cancer screening Pap collected today. Will send as below and f/u pending results. - Pap IG and HPV (high risk) DNA detection  5. Screening for HIV without presence of risk factors - HIV antibody  6. Need for hepatitis C screening test - Hepatitis C antibody  7. Need for pneumococcal vaccination Vaccine given to patient without complications. Patient sat for 15 minutes after administration and was tolerated well without adverse effects. - Pneumococcal conjugate vaccine 13-valent IM  8. Encounter for screening for osteoporosis Due for osteoporosis screening secondary to post menopausal estrogen deficiency. I will follow up pending results.  - DG Bone Density; Future  9. Postmenopausal estrogen deficiency See above medical treatment plan. - DG Bone Density; Future  10. Type 2 diabetes mellitus without complication, without long-term current use of insulin ([place]) Will check labs as below and f/u pending results. - Comprehensive metabolic panel -  Hemoglobin A1c  11. Hypercholesterolemia Will check labs as below and f/u pending results. - Comprehensive metabolic panel - Lipid panel  12. Positive fecal occult blood test Will refer back to Dr. Vira Agar.  - Ambulatory referral to Gastroenterology  13. History of adenomatous polyp of colon Last colonoscopy in 2015 was positive for tubular adenoma. - Ambulatory referral to Gastroenterology  --------------------------------------------------------------------    Mar Daring, PA-C  [facility]

## 2016-10-13 ENCOUNTER — Telehealth: Payer: Self-pay

## 2016-10-13 DIAGNOSIS — E7800 Pure hypercholesterolemia, unspecified: Secondary | ICD-10-CM

## 2016-10-13 LAB — CBC WITH DIFFERENTIAL/PLATELET
Basophils Absolute: 0 10*3/uL (ref 0.0–0.2)
Basos: 1 %
EOS (ABSOLUTE): 0.2 10*3/uL (ref 0.0–0.4)
Eos: 3 %
Hematocrit: 33.8 % — ABNORMAL LOW (ref 34.0–46.6)
Hemoglobin: 11 g/dL — ABNORMAL LOW (ref 11.1–15.9)
Immature Grans (Abs): 0.1 10*3/uL (ref 0.0–0.1)
Immature Granulocytes: 2 %
Lymphocytes Absolute: 1.8 10*3/uL (ref 0.7–3.1)
Lymphs: 27 %
MCH: 26.5 pg — ABNORMAL LOW (ref 26.6–33.0)
MCHC: 32.5 g/dL (ref 31.5–35.7)
MCV: 81 fL (ref 79–97)
Monocytes Absolute: 0.5 10*3/uL (ref 0.1–0.9)
Monocytes: 8 %
Neutrophils Absolute: 3.9 10*3/uL (ref 1.4–7.0)
Neutrophils: 59 %
Platelets: 176 10*3/uL (ref 150–379)
RBC: 4.15 x10E6/uL (ref 3.77–5.28)
RDW: 15.5 % — ABNORMAL HIGH (ref 12.3–15.4)
WBC: 6.5 10*3/uL (ref 3.4–10.8)

## 2016-10-13 LAB — COMPREHENSIVE METABOLIC PANEL
ALT: 27 IU/L (ref 0–32)
AST: 26 IU/L (ref 0–40)
Albumin/Globulin Ratio: 1.5 (ref 1.2–2.2)
Albumin: 4.1 g/dL (ref 3.6–4.8)
Alkaline Phosphatase: 80 IU/L (ref 39–117)
BUN/Creatinine Ratio: 21 (ref 12–28)
BUN: 40 mg/dL — ABNORMAL HIGH (ref 8–27)
Bilirubin Total: <0.2 mg/dL (ref 0.0–1.2)
CO2: 18 mmol/L (ref 18–29)
Calcium: 9 mg/dL (ref 8.7–10.3)
Chloride: 105 mmol/L (ref 96–106)
Creatinine, Ser: 1.9 mg/dL — ABNORMAL HIGH (ref 0.57–1.00)
GFR calc Af Amer: 31 mL/min/{1.73_m2} — ABNORMAL LOW (ref >59)
GFR calc non Af Amer: 27 mL/min/{1.73_m2} — ABNORMAL LOW (ref >59)
Globulin, Total: 2.7 g/dL (ref 1.5–4.5)
Glucose: 122 mg/dL — ABNORMAL HIGH (ref 65–99)
Potassium: 5.1 mmol/L (ref 3.5–5.2)
Sodium: 141 mmol/L (ref 134–144)
Total Protein: 6.8 g/dL (ref 6.0–8.5)

## 2016-10-13 LAB — TSH: TSH: 2.66 u[IU]/mL (ref 0.450–4.500)

## 2016-10-13 LAB — HEMOGLOBIN A1C
Est. average glucose Bld gHb Est-mCnc: 148 mg/dL
Hgb A1c MFr Bld: 6.8 % — ABNORMAL HIGH (ref 4.8–5.6)

## 2016-10-13 LAB — LIPID PANEL
Chol/HDL Ratio: 6.4 ratio — ABNORMAL HIGH (ref 0.0–4.4)
Cholesterol, Total: 210 mg/dL — ABNORMAL HIGH (ref 100–199)
HDL: 33 mg/dL — ABNORMAL LOW (ref >39)
LDL Calculated: 97 mg/dL (ref 0–99)
Triglycerides: 400 mg/dL — ABNORMAL HIGH (ref 0–149)
VLDL Cholesterol Cal: 80 mg/dL — ABNORMAL HIGH (ref 5–40)

## 2016-10-13 LAB — HIV ANTIBODY (ROUTINE TESTING W REFLEX): HIV Screen 4th Generation wRfx: NONREACTIVE

## 2016-10-13 LAB — HEPATITIS C ANTIBODY: Hep C Virus Ab: <0.1 s/co ratio (ref 0.0–0.9)

## 2016-10-13 MED ORDER — PRAVASTATIN SODIUM 20 MG PO TABS: 20.0000 mg | ORAL_TABLET | Freq: Every day | ORAL | 1 refills | Status: DC

## 2016-10-13 NOTE — Telephone Encounter (Signed)
Pt advised. She does agree to start medication. She states she was on a medication before that caused leg weakness. But she is willing to try again. [facility]. Renaldo Fiddler, CMA

## 2016-10-13 NOTE — Telephone Encounter (Signed)
-----   Message from Mar Daring, [place] sent at 10/13/2016 11:52 AM EDT ----- All labs are within normal limits and stable with exception of Cholesterol that is up just slightly from last year.   A1c stable at 6.8. Would recommend a cholesterol lowering medication along with continuing lifestyle modifications to help lower cardiovascular risk with elevated cholesterol. Thanks! -JB

## 2016-10-13 NOTE — Telephone Encounter (Signed)
Tried calling home phone and it was disconnected. LMTCB at work phone. Renaldo Fiddler, CMA

## 2016-10-13 NOTE — Telephone Encounter (Signed)
Sent in. She is to take in the evening around 8pm before bed.

## 2016-10-14 LAB — PAP IG AND HPV HIGH-RISK
HPV, high-risk: NEGATIVE
PAP Smear Comment: 0

## 2016-10-16 NOTE — Progress Notes (Signed)
Advised  ED 

## 2016-10-23 LAB — HM DIABETES EYE EXAM

## 2016-11-06 ENCOUNTER — Other Ambulatory Visit: Payer: Self-pay | Admitting: Physician Assistant

## 2016-11-06 DIAGNOSIS — E1121 Type 2 diabetes mellitus with diabetic nephropathy: Secondary | ICD-10-CM

## 2016-11-20 DIAGNOSIS — R195 Other fecal abnormalities: Secondary | ICD-10-CM | POA: Insufficient documentation

## 2016-11-23 ENCOUNTER — Ambulatory Visit
Admission: RE | Admit: 2016-11-23 | Discharge: 2016-11-23 | Disposition: A | Discharge status: Home / Self Care | Payer: BLUE CROSS/BLUE SHIELD | Source: Ambulatory Visit | Attending: Physician Assistant | Admitting: Physician Assistant

## 2016-11-23 DIAGNOSIS — Z780 Asymptomatic menopausal state: Secondary | ICD-10-CM | POA: Insufficient documentation

## 2016-11-23 DIAGNOSIS — Z13820 Encounter for screening for osteoporosis: Secondary | ICD-10-CM | POA: Diagnosis not present

## 2016-11-23 DIAGNOSIS — E119 Type 2 diabetes mellitus without complications: Secondary | ICD-10-CM | POA: Insufficient documentation

## 2016-11-27 ENCOUNTER — Other Ambulatory Visit: Payer: Self-pay | Admitting: Physician Assistant

## 2016-11-27 DIAGNOSIS — E1121 Type 2 diabetes mellitus with diabetic nephropathy: Secondary | ICD-10-CM

## 2016-11-28 ENCOUNTER — Telehealth: Payer: Self-pay

## 2016-11-28 NOTE — Telephone Encounter (Signed)
Left message advising pt. Ok per [employer]. Renaldo Fiddler, CMA

## 2016-11-28 NOTE — Telephone Encounter (Signed)
-----   Message from Mar Daring, [place] sent at 11/28/2016  2:01 PM EDT ----- Bone density is normal.

## 2016-11-28 NOTE — Telephone Encounter (Signed)
LOV 10/12/2016, diabetes stable at that time. Renaldo Fiddler, CMA

## 2017-02-25 ENCOUNTER — Other Ambulatory Visit: Payer: Self-pay | Admitting: Physician Assistant

## 2017-02-25 DIAGNOSIS — I10 Essential (primary) hypertension: Secondary | ICD-10-CM

## 2017-04-09 ENCOUNTER — Other Ambulatory Visit: Payer: Self-pay | Admitting: Physician Assistant

## 2017-04-09 DIAGNOSIS — E7800 Pure hypercholesterolemia, unspecified: Secondary | ICD-10-CM

## 2017-05-03 ENCOUNTER — Other Ambulatory Visit: Payer: Self-pay | Admitting: Physician Assistant

## 2017-05-03 DIAGNOSIS — Z1231 Encounter for screening mammogram for malignant neoplasm of breast: Secondary | ICD-10-CM

## 2017-05-04 LAB — HM DIABETES EYE EXAM

## 2017-05-10 ENCOUNTER — Other Ambulatory Visit: Payer: Self-pay | Admitting: Physician Assistant

## 2017-05-10 DIAGNOSIS — E1121 Type 2 diabetes mellitus with diabetic nephropathy: Secondary | ICD-10-CM

## 2017-05-11 NOTE — Discharge Instructions (Signed)

## 2017-05-14 ENCOUNTER — Ambulatory Visit: Payer: BLUE CROSS/BLUE SHIELD | Admitting: Anesthesiology

## 2017-05-14 ENCOUNTER — Ambulatory Visit
Admission: RE | Admit: 2017-05-14 | Discharge: 2017-05-14 | Disposition: A | Discharge status: Home / Self Care | Payer: BLUE CROSS/BLUE SHIELD | Source: Ambulatory Visit | Attending: Ophthalmology | Admitting: Ophthalmology

## 2017-05-14 ENCOUNTER — Encounter: Payer: Self-pay | Admitting: *Deleted

## 2017-05-14 ENCOUNTER — Encounter
Admission: RE | Disposition: A | Discharge status: Home / Self Care | Payer: Self-pay | Source: Ambulatory Visit | Attending: Ophthalmology

## 2017-05-14 ENCOUNTER — Encounter: Payer: Self-pay | Admitting: Physician Assistant

## 2017-05-14 DIAGNOSIS — K219 Gastro-esophageal reflux disease without esophagitis: Secondary | ICD-10-CM | POA: Insufficient documentation

## 2017-05-14 DIAGNOSIS — E119 Type 2 diabetes mellitus without complications: Secondary | ICD-10-CM | POA: Insufficient documentation

## 2017-05-14 DIAGNOSIS — Z87891 Personal history of nicotine dependence: Secondary | ICD-10-CM | POA: Diagnosis not present

## 2017-05-14 DIAGNOSIS — I10 Essential (primary) hypertension: Secondary | ICD-10-CM | POA: Insufficient documentation

## 2017-05-14 DIAGNOSIS — H2512 Age-related nuclear cataract, left eye: Secondary | ICD-10-CM | POA: Insufficient documentation

## 2017-05-14 DIAGNOSIS — G4730 Sleep apnea, unspecified: Secondary | ICD-10-CM | POA: Insufficient documentation

## 2017-05-14 DIAGNOSIS — Z79899 Other long term (current) drug therapy: Secondary | ICD-10-CM | POA: Insufficient documentation

## 2017-05-14 DIAGNOSIS — E7800 Pure hypercholesterolemia, unspecified: Secondary | ICD-10-CM | POA: Diagnosis not present

## 2017-05-14 DIAGNOSIS — Z7984 Long term (current) use of oral hypoglycemic drugs: Secondary | ICD-10-CM | POA: Diagnosis not present

## 2017-05-14 HISTORY — PX: CATARACT EXTRACTION W/PHACO: SHX586

## 2017-05-14 HISTORY — DX: Calculus of kidney: N20.0

## 2017-05-14 HISTORY — DX: Pure hypercholesterolemia, unspecified: E78.00

## 2017-05-14 HISTORY — DX: Gastro-esophageal reflux disease without esophagitis: K21.9

## 2017-05-14 HISTORY — DX: Sleep apnea, unspecified: G47.30

## 2017-05-14 HISTORY — DX: Type 2 diabetes mellitus without complications: E11.9

## 2017-05-14 HISTORY — DX: Disorder of parathyroid gland, unspecified: E21.5

## 2017-05-14 HISTORY — DX: Dizziness and giddiness: R42

## 2017-05-14 LAB — GLUCOSE, CAPILLARY
Glucose-Capillary: 142 mg/dL — ABNORMAL HIGH (ref 65–99)
Glucose-Capillary: 136 mg/dL — ABNORMAL HIGH (ref 65–99)

## 2017-05-14 SURGERY — PHACOEMULSIFICATION, CATARACT, WITH IOL INSERTION
Anesthesia: Monitor Anesthesia Care | Laterality: Left | Wound class: Clean

## 2017-05-14 MED ORDER — BRIMONIDINE TARTRATE-TIMOLOL 0.2-0.5 % OP SOLN
OPHTHALMIC | Status: DC | PRN
Start: 2017-05-14 — End: 2017-05-14
  Administered 2017-05-14: 1 [drp] via OPHTHALMIC

## 2017-05-14 MED ORDER — LACTATED RINGERS IV SOLN: 1000.0000 mL | INTRAVENOUS | Status: DC

## 2017-05-14 MED ORDER — MIDAZOLAM HCL 2 MG/2ML IJ SOLN
INTRAMUSCULAR | Status: DC | PRN
  Administered 2017-05-14: 2 mg via INTRAVENOUS

## 2017-05-14 MED ORDER — FENTANYL CITRATE (PF) 100 MCG/2ML IJ SOLN
INTRAMUSCULAR | Status: DC | PRN
  Administered 2017-05-14: 50 ug via INTRAVENOUS

## 2017-05-14 MED ORDER — NA HYALUR & NA CHOND-NA HYALUR 0.4-0.35 ML IO KIT
PACK | INTRAOCULAR | Status: DC | PRN
  Administered 2017-05-14: 1 mL via INTRAOCULAR

## 2017-05-14 MED ORDER — ARMC OPHTHALMIC DILATING DROPS
1.0000 "application " | OPHTHALMIC | Status: DC | PRN
  Administered 2017-05-14 (×3): 1 via OPHTHALMIC

## 2017-05-14 MED ORDER — BALANCED SALT IO SOLN
INTRAOCULAR | Status: DC | PRN
  Administered 2017-05-14: 1 mL via INTRAMUSCULAR

## 2017-05-14 MED ORDER — EPINEPHRINE PF 1 MG/ML IJ SOLN
INTRAOCULAR | Status: DC | PRN
  Administered 2017-05-14: 70 mL via OPHTHALMIC

## 2017-05-14 MED ORDER — POLYMYXIN B-TRIMETHOPRIM 10000-0.1 UNIT/ML-% OP SOLN
1.0000 [drp] | OPHTHALMIC | Status: DC | PRN
  Administered 2017-05-14 (×3): 1 [drp] via OPHTHALMIC

## 2017-05-14 MED ORDER — CEFUROXIME OPHTHALMIC INJECTION 1 MG/0.1 ML
INJECTION | OPHTHALMIC | Status: DC | PRN
  Administered 2017-05-14: 0.1 mL via INTRACAMERAL

## 2017-05-14 SURGICAL SUPPLY — 25 items
CANNULA ANT/CHMB 27GA (MISCELLANEOUS) ×2 IMPLANT
CARTRIDGE ABBOTT (MISCELLANEOUS) IMPLANT
GLOVE SURG LX 7.5 STRW (GLOVE) ×1
GLOVE SURG LX STRL 7.5 STRW (GLOVE) ×1 IMPLANT
GLOVE SURG TRIUMPH 8.0 PF LTX (GLOVE) ×2 IMPLANT
GOWN STRL REUS W/ TWL LRG LVL3 (GOWN DISPOSABLE) ×2 IMPLANT
GOWN STRL REUS W/TWL LRG LVL3 (GOWN DISPOSABLE) ×2
LENS IOL TECNIS ITEC 18.0 (Intraocular Lens) ×2 IMPLANT
MARKER SKIN DUAL TIP RULER LAB (MISCELLANEOUS) ×2 IMPLANT
NDL RETROBULBAR .5 NSTRL (NEEDLE) IMPLANT
NEEDLE FILTER BLUNT 18X 1/2SAF (NEEDLE) ×1
NEEDLE FILTER BLUNT 18X1 1/2 (NEEDLE) ×1 IMPLANT
PACK CATARACT BRASINGTON (MISCELLANEOUS) ×2 IMPLANT
PACK EYE AFTER SURG (MISCELLANEOUS) ×2 IMPLANT
PACK OPTHALMIC (MISCELLANEOUS) ×2 IMPLANT
RING MALYGIN 7.0 (MISCELLANEOUS) IMPLANT
SUT ETHILON 10-0 CS-B-6CS-B-6 (SUTURE)
SUT VICRYL  9 0 (SUTURE)
SUT VICRYL 9 0 (SUTURE) IMPLANT
SUTURE EHLN 10-0 CS-B-6CS-B-6 (SUTURE) IMPLANT
SYR 3ML LL SCALE MARK (SYRINGE) ×2 IMPLANT
SYR 5ML LL (SYRINGE) ×2 IMPLANT
SYR TB 1ML LUER SLIP (SYRINGE) ×2 IMPLANT
WATER STERILE IRR 250ML POUR (IV SOLUTION) ×2 IMPLANT
WIPE NON LINTING 3.25X3.25 (MISCELLANEOUS) ×2 IMPLANT

## 2017-05-14 NOTE — H&P (Signed)
The History and Physical notes are on paper, have been signed, and are to be scanned. The patient remains stable and unchanged from the H&P.   Previous H&P reviewed, patient examined, and there are no changes.  Debra Paul 05/14/2017 8:19 AM

## 2017-05-14 NOTE — Anesthesia Postprocedure Evaluation (Signed)
Anesthesia Post Note  Patient: Debra Paul  Procedure(s) Performed: CATARACT EXTRACTION PHACO AND INTRAOCULAR LENS PLACEMENT (IOC) LEFT DIABETIC (Left )  Patient location during evaluation: PACU Anesthesia Type: MAC Level of consciousness: awake Pain management: pain level controlled Vital Signs Assessment: post-procedure vital signs reviewed and stable Respiratory status: spontaneous breathing Cardiovascular status: blood pressure returned to baseline Postop Assessment: no headache Anesthetic complications: no    Lavonna Monarch

## 2017-05-14 NOTE — Op Note (Signed)
OPERATIVE NOTE  Debra Paul [id number] 05/14/2017   PREOPERATIVE DIAGNOSIS:  Nuclear sclerotic cataract left eye. H25.12   POSTOPERATIVE DIAGNOSIS:    Nuclear sclerotic cataract left eye.     PROCEDURE:  Phacoemusification with posterior chamber intraocular lens placement of the left eye   LENS:   Implant Name Type Inv. Item Serial No. Manufacturer Lot No. LRB No. Used  LENS IOL DIOP 18.0 - [id number] Intraocular Lens LENS IOL DIOP 18.0 [id number] AMO  Left 1        ULTRASOUND TIME: 11  % of 1 minutes 6 seconds, CDE 7.4  SURGEON:  Wyonia Hough, MD   ANESTHESIA:  Topical with tetracaine drops and 2% Xylocaine jelly, augmented with 1% preservative-free intracameral lidocaine.    COMPLICATIONS:  None.   DESCRIPTION OF PROCEDURE:  The patient was identified in the holding room and transported to the operating room and placed in the supine position under the operating microscope.  The left eye was identified as the operative eye and it was prepped and draped in the usual sterile ophthalmic fashion.   A 1 millimeter clear-corneal paracentesis was made at the 4:30 position.  0.5 ml of preservative-free 1% lidocaine was injected into the anterior chamber.  The anterior chamber was filled with Viscoat viscoelastic.  A 2.4 millimeter keratome was used to make a near-clear corneal incision at the 1:30 position.  .  A curvilinear capsulorrhexis was made with a cystotome and capsulorrhexis forceps.  Balanced salt solution was used to hydrodissect and hydrodelineate the nucleus.   Phacoemulsification was then used in stop and chop fashion to remove the lens nucleus and epinucleus.  The remaining cortex was then removed using the irrigation and aspiration handpiece. Provisc was then placed into the capsular bag to distend it for lens placement.  A lens was then injected into the capsular bag.  The remaining viscoelastic was aspirated.   Wounds were hydrated with balanced salt  solution.  The anterior chamber was inflated to a physiologic pressure with balanced salt solution.  No wound leaks were noted. Cefuroxime 0.1 ml of a 10mg /ml solution was injected into the anterior chamber for a dose of 1 mg of intracameral antibiotic at the completion of the case.   Timolol and Brimonidine drops were applied to the eye.  The patient was taken to the recovery room in stable condition without complications of anesthesia or surgery.  [name] 05/14/2017, 9:11 AM

## 2017-05-14 NOTE — Anesthesia Preprocedure Evaluation (Addendum)
Anesthesia Evaluation  Patient identified by MRN, date of birth, ID band Patient awake    Reviewed: Allergy & Precautions, NPO status , Patient's Chart, lab work & pertinent test results, reviewed documented beta blocker date and time   Airway Mallampati: II  TM Distance: >3 FB Neck ROM: Full    Dental no notable dental hx.    Pulmonary sleep apnea ,    Pulmonary exam normal breath sounds clear to auscultation       Cardiovascular hypertension, Normal cardiovascular exam Rhythm:Regular Rate:Normal     Neuro/Psych negative neurological ROS  negative psych ROS   GI/Hepatic GERD  ,  Endo/Other  diabetes, Type 2  Renal/GU   negative genitourinary   Musculoskeletal negative musculoskeletal ROS (+)   Abdominal (+) + obese,   Peds  Hematology  (+) anemia ,   Anesthesia Other Findings   Reproductive/Obstetrics negative OB ROS                            Anesthesia Physical Anesthesia Plan  ASA: II  Anesthesia Plan: MAC   Post-op Pain Management:    Induction: Intravenous  PONV Risk Score and Plan:   Airway Management Planned: Nasal Cannula  Additional Equipment: None  Intra-op Plan:   Post-operative Plan:   Informed Consent: I have reviewed the patients History and Physical, chart, labs and discussed the procedure including the risks, benefits and alternatives for the proposed anesthesia with the patient or authorized representative who has indicated his/her understanding and acceptance.     Plan Discussed with: CRNA, Anesthesiologist and Surgeon  Anesthesia Plan Comments:         Anesthesia Quick Evaluation

## 2017-05-14 NOTE — Anesthesia Procedure Notes (Signed)
Procedure Name: MAC Performed by: Charlisha Market, CRNA Pre-anesthesia Checklist: Patient identified, Emergency Drugs available, Suction available, Timeout performed and Patient being monitored Patient Re-evaluated:Patient Re-evaluated prior to induction Oxygen Delivery Method: Nasal cannula Placement Confirmation: positive ETCO2       

## 2017-05-14 NOTE — Transfer of Care (Signed)
Immediate Anesthesia Transfer of Care Note  Patient: Debra Paul  Procedure(s) Performed: CATARACT EXTRACTION PHACO AND INTRAOCULAR LENS PLACEMENT (IOC) LEFT DIABETIC (Left )  Patient Location: PACU  Anesthesia Type: MAC  Level of Consciousness: awake, alert  and patient cooperative  Airway and Oxygen Therapy: Patient Spontanous Breathing and Patient connected to supplemental oxygen  Post-op Assessment: Post-op Vital signs reviewed, Patient's Cardiovascular Status Stable, Respiratory Function Stable, Patent Airway and No signs of Nausea or vomiting  Post-op Vital Signs: Reviewed and stable  Complications: No apparent anesthesia complications

## 2017-05-15 ENCOUNTER — Encounter: Payer: Self-pay | Admitting: Ophthalmology

## 2017-05-17 ENCOUNTER — Telehealth: Payer: Self-pay

## 2017-05-17 ENCOUNTER — Ambulatory Visit
Admission: RE | Admit: 2017-05-17 | Discharge: 2017-05-17 | Disposition: A | Discharge status: Home / Self Care | Payer: BLUE CROSS/BLUE SHIELD | Source: Ambulatory Visit | Attending: Physician Assistant | Admitting: Physician Assistant

## 2017-05-17 DIAGNOSIS — Z1231 Encounter for screening mammogram for malignant neoplasm of breast: Secondary | ICD-10-CM | POA: Diagnosis not present

## 2017-05-17 NOTE — Telephone Encounter (Signed)
-----   Message from Mar Daring, [place] sent at 05/17/2017  1:24 PM EST ----- Normal mammogram. Repeat screening in one year.

## 2017-05-17 NOTE — Telephone Encounter (Signed)
Patient advised as below.  

## 2017-05-27 ENCOUNTER — Other Ambulatory Visit: Payer: Self-pay | Admitting: Physician Assistant

## 2017-05-27 DIAGNOSIS — E1121 Type 2 diabetes mellitus with diabetic nephropathy: Secondary | ICD-10-CM

## 2017-08-25 ENCOUNTER — Other Ambulatory Visit: Payer: Self-pay | Admitting: Physician Assistant

## 2017-08-25 DIAGNOSIS — I10 Essential (primary) hypertension: Secondary | ICD-10-CM

## 2017-08-31 IMAGING — CR DG ABDOMEN 2V
1 series · 2 of 2 positions shown · non-contrast
Comparison: KUB of 09/09/2012, 09/05/2011, and CT abdomen pelvis of
11/08/2009

CLINICAL DATA: Left lower quadrant abdominal pain, hematuria,
history of kidney stones

EXAM:
ABDOMEN - 2 VIEW

[Series 1: dg abd 2 views · 0.14mm/px · 2 of 2 slices shown]
[im 1/2]
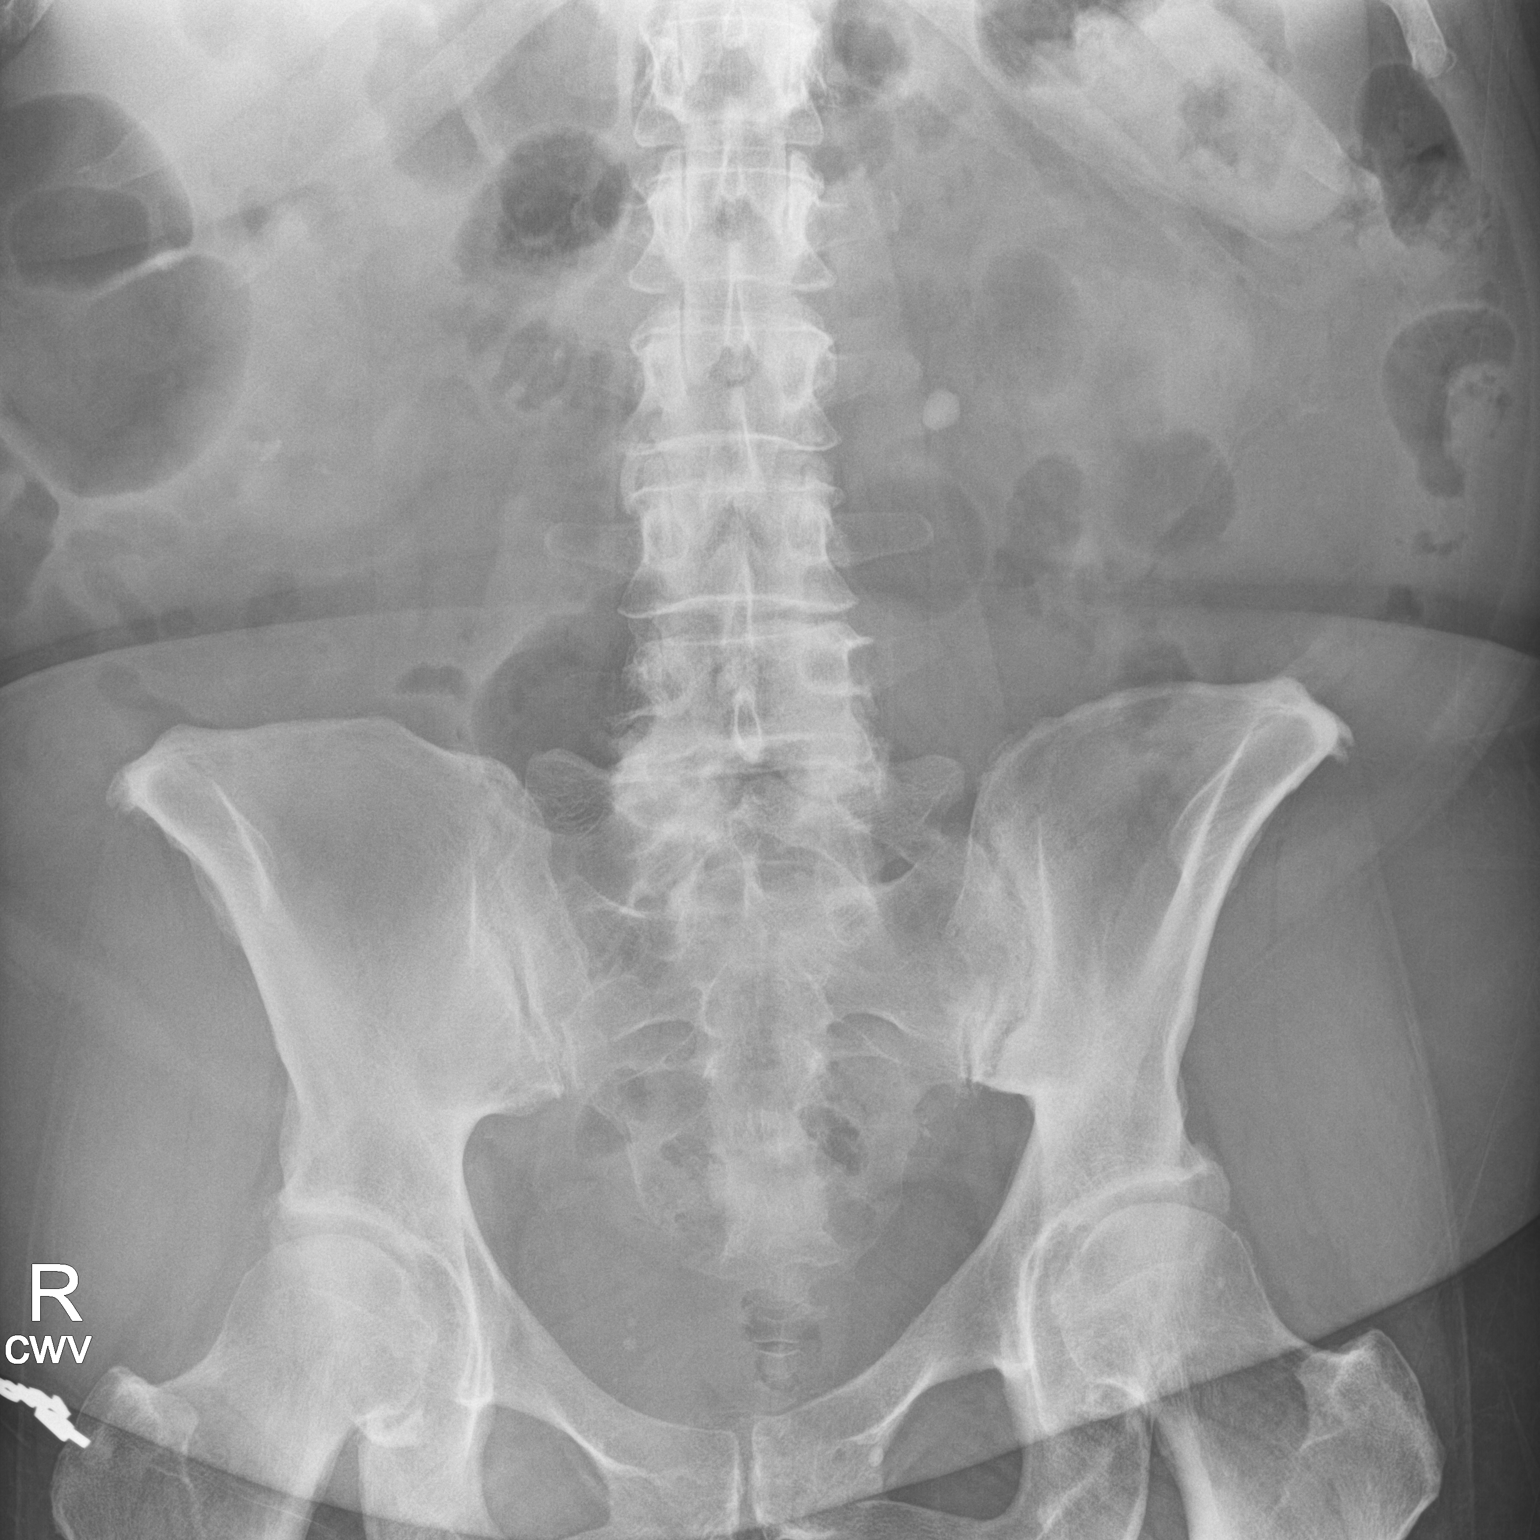
[im 2/2]
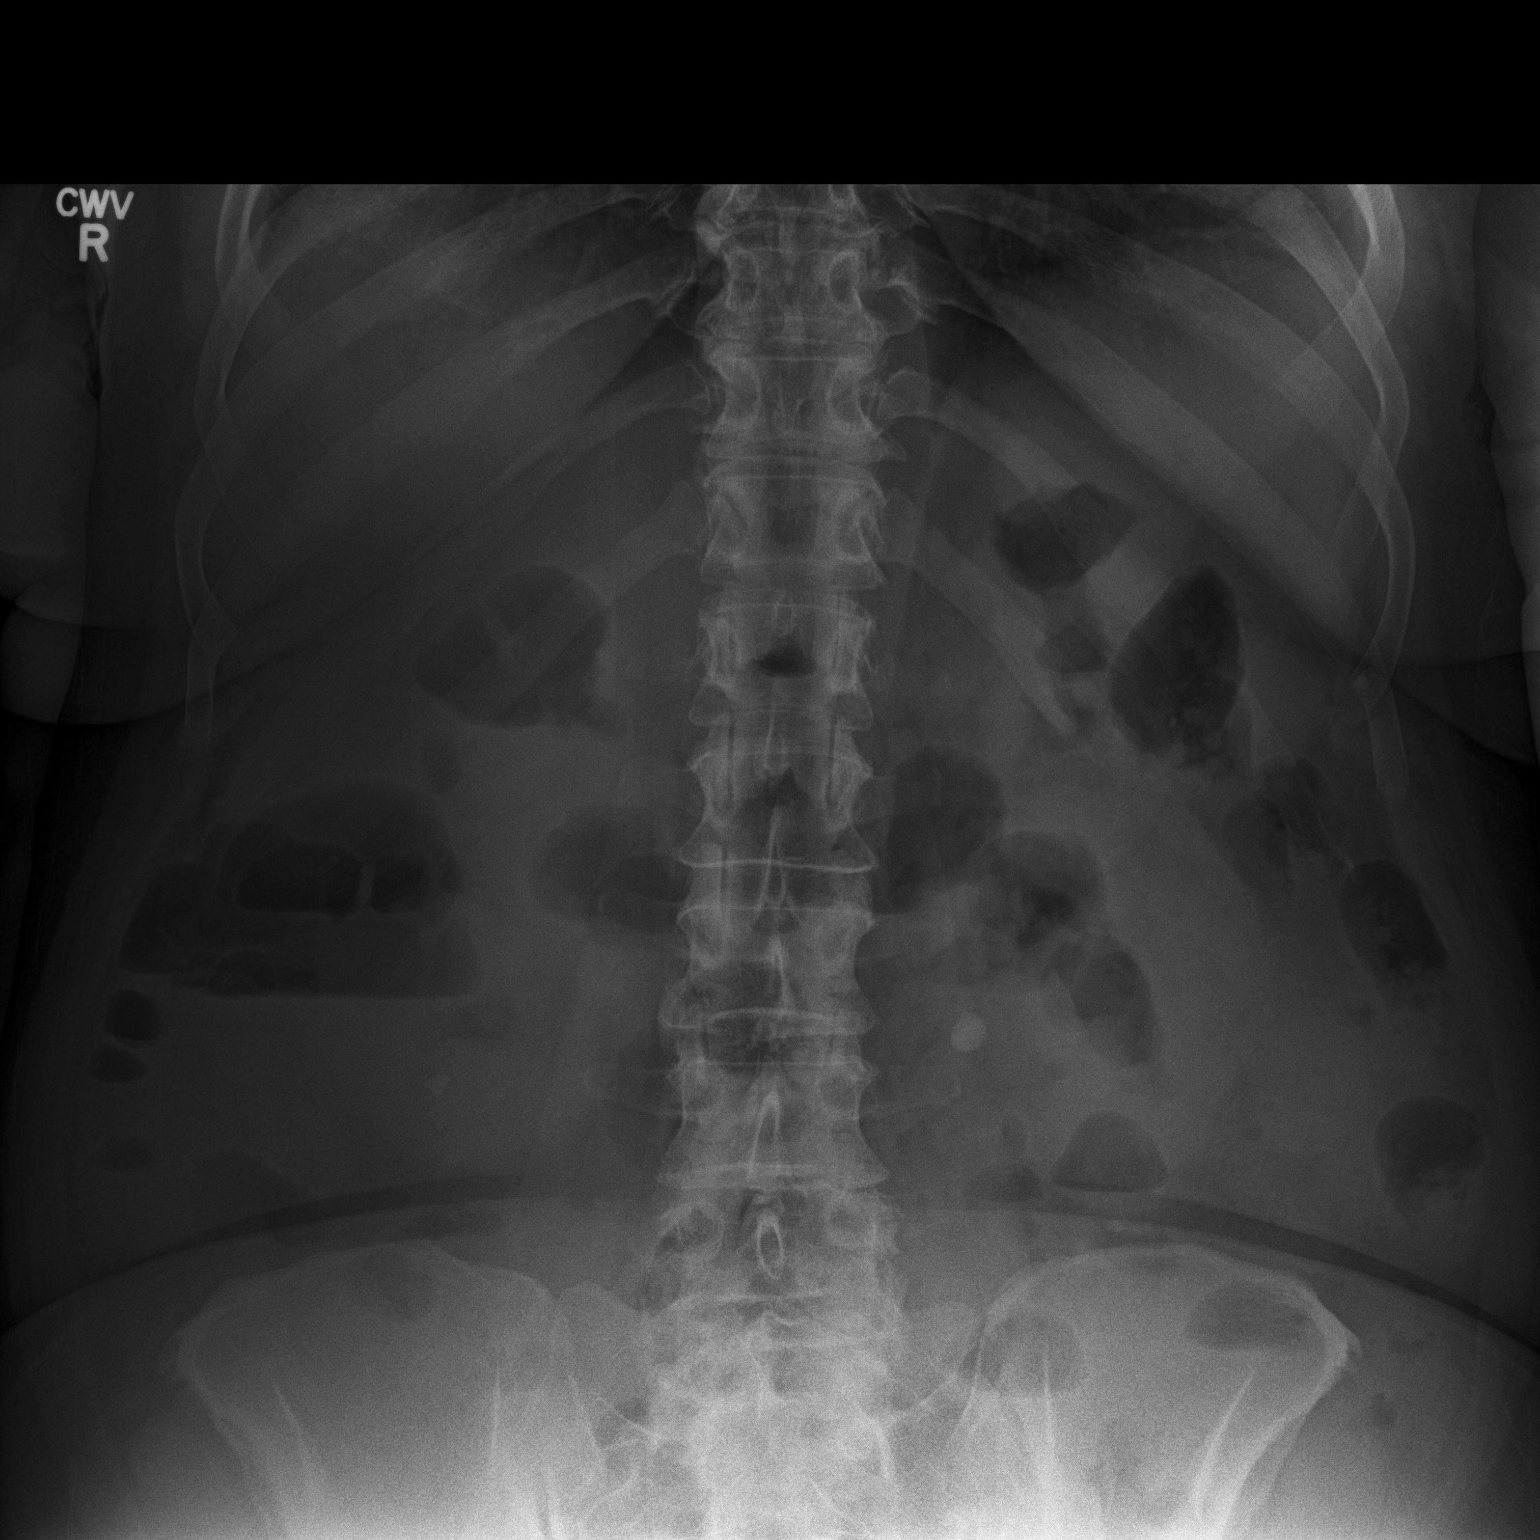

[2 of 2 positions shown; findings below may reference images not displayed]

FINDINGS: And 11 mm calcification now overlies the left abdomen in the
expected region of the left UP junction. This may represent the
previously noted left renal calculus which has moved into the left
renal pelvis or proximal left ureter. Small right renal calculus
also is noted. There is some small bowel gas with a few scattered
air-fluid levels most consistent with mild ileus. No free
intraperitoneal air is seen.
IMPRESSION: 1. 11 mm calcification overlies the expected region of the left UP
junction which may represent a calculus as noted above.
2. Small right renal calculus.
3. Probable mild ileus.

## 2017-10-12 ENCOUNTER — Other Ambulatory Visit: Payer: Self-pay | Admitting: Physician Assistant

## 2017-10-12 DIAGNOSIS — E7800 Pure hypercholesterolemia, unspecified: Secondary | ICD-10-CM

## 2017-10-16 ENCOUNTER — Other Ambulatory Visit: Payer: Self-pay | Admitting: Physician Assistant

## 2017-10-16 DIAGNOSIS — E1121 Type 2 diabetes mellitus with diabetic nephropathy: Secondary | ICD-10-CM

## 2017-10-16 DIAGNOSIS — I10 Essential (primary) hypertension: Secondary | ICD-10-CM

## 2017-10-16 MED ORDER — METFORMIN HCL 1000 MG PO TABS: 1000.0000 mg | ORAL_TABLET | Freq: Two times a day (BID) | ORAL | 1 refills | Status: DC

## 2017-10-16 MED ORDER — SITAGLIPTIN PHOSPHATE 50 MG PO TABS: 50.0000 mg | ORAL_TABLET | Freq: Every day | ORAL | 1 refills | Status: DC

## 2017-10-16 MED ORDER — LISINOPRIL 20 MG PO TABS: 20.0000 mg | ORAL_TABLET | Freq: Every day | ORAL | 1 refills | Status: DC

## 2017-10-16 NOTE — Telephone Encounter (Signed)
Please review. Thanks!  

## 2017-10-16 NOTE — Telephone Encounter (Signed)
[facility] faxed a refill request for the following medications. Thanks CC  JANUVIA 50 MG tablet   metFORMIN (GLUCOPHAGE) 1000 MG tablet   lisinopril (PRINIVIL,ZESTRIL) 20 MG tablet

## 2017-10-17 ENCOUNTER — Encounter: Payer: Self-pay | Admitting: Physician Assistant

## 2017-10-17 ENCOUNTER — Ambulatory Visit (INDEPENDENT_AMBULATORY_CARE_PROVIDER_SITE_OTHER): Payer: Medicare Other | Admitting: Physician Assistant

## 2017-10-17 VITALS — BP 134/82 | HR 72 | Temp 97.9°F | Resp 16 | Ht 69.0 in | Wt 223.0 lb

## 2017-10-17 DIAGNOSIS — K760 Fatty (change of) liver, not elsewhere classified: Secondary | ICD-10-CM

## 2017-10-17 DIAGNOSIS — N183 Chronic kidney disease, stage 3 unspecified: Secondary | ICD-10-CM

## 2017-10-17 DIAGNOSIS — Z6832 Body mass index (BMI) 32.0-32.9, adult: Secondary | ICD-10-CM | POA: Diagnosis not present

## 2017-10-17 DIAGNOSIS — E1121 Type 2 diabetes mellitus with diabetic nephropathy: Secondary | ICD-10-CM

## 2017-10-17 DIAGNOSIS — Z0000 Encounter for general adult medical examination without abnormal findings: Secondary | ICD-10-CM

## 2017-10-17 DIAGNOSIS — Z23 Encounter for immunization: Secondary | ICD-10-CM

## 2017-10-17 DIAGNOSIS — I10 Essential (primary) hypertension: Secondary | ICD-10-CM | POA: Diagnosis not present

## 2017-10-17 NOTE — Patient Instructions (Signed)
Health Maintenance for Postmenopausal Women Menopause is a normal process in which your reproductive ability comes to an end. This process happens gradually over a span of months to years, usually between the ages of 22 and 9. Menopause is complete when you have missed 12 consecutive menstrual periods. It is important to talk with your health care provider about some of the most common conditions that affect postmenopausal women, such as heart disease, cancer, and bone loss (osteoporosis). Adopting a healthy lifestyle and getting preventive care can help to promote your health and wellness. Those actions can also lower your chances of developing some of these common conditions. What should I know about menopause? During menopause, you may experience a number of symptoms, such as:  Moderate-to-severe hot flashes.  Night sweats.  Decrease in sex drive.  Mood swings.  Headaches.  Tiredness.  Irritability.  Memory problems.  Insomnia.  Choosing to treat or not to treat menopausal changes is an individual decision that you make with your health care provider. What should I know about hormone replacement therapy and supplements? Hormone therapy products are effective for treating symptoms that are associated with menopause, such as hot flashes and night sweats. Hormone replacement carries certain risks, especially as you become older. If you are thinking about using estrogen or estrogen with progestin treatments, discuss the benefits and risks with your health care provider. What should I know about heart disease and stroke? Heart disease, heart attack, and stroke become more likely as you age. This may be due, in part, to the hormonal changes that your body experiences during menopause. These can affect how your body processes dietary fats, triglycerides, and cholesterol. Heart attack and stroke are both medical emergencies. There are many things that you can do to help prevent heart disease  and stroke:  Have your blood pressure checked at least every 1-2 years. High blood pressure causes heart disease and increases the risk of stroke.  If you are 53-22 years old, ask your health care provider if you should take aspirin to prevent a heart attack or a stroke.  Do not use any tobacco products, including cigarettes, chewing tobacco, or electronic cigarettes. If you need help quitting, ask your health care provider.  It is important to eat a healthy diet and maintain a healthy weight. ? Be sure to include plenty of vegetables, fruits, low-fat dairy products, and lean protein. ? Avoid eating foods that are high in solid fats, added sugars, or salt (sodium).  Get regular exercise. This is one of the most important things that you can do for your health. ? Try to exercise for at least 150 minutes each week. The type of exercise that you do should increase your heart rate and make you sweat. This is known as moderate-intensity exercise. ? Try to do strengthening exercises at least twice each week. Do these in addition to the moderate-intensity exercise.  Know your numbers.Ask your health care provider to check your cholesterol and your blood glucose. Continue to have your blood tested as directed by your health care provider.  What should I know about cancer screening? There are several types of cancer. Take the following steps to reduce your risk and to catch any cancer development as early as possible. Breast Cancer  Practice breast self-awareness. ? This means understanding how your breasts normally appear and feel. ? It also means doing regular breast self-exams. Let your health care provider know about any changes, no matter how small.  If you are 40  or older, have a clinician do a breast exam (clinical breast exam or CBE) every year. Depending on your age, family history, and medical history, it may be recommended that you also have a yearly breast X-ray (mammogram).  If you  have a family history of breast cancer, talk with your health care provider about genetic screening.  If you are at high risk for breast cancer, talk with your health care provider about having an MRI and a mammogram every year.  Breast cancer (BRCA) gene test is recommended for women who have family members with BRCA-related cancers. Results of the assessment will determine the need for genetic counseling and BRCA1 and for BRCA2 testing. BRCA-related cancers include these types: ? Breast. This occurs in males or females. ? Ovarian. ? Tubal. This may also be called fallopian tube cancer. ? Cancer of the abdominal or pelvic lining (peritoneal cancer). ? Prostate. ? Pancreatic.  Cervical, Uterine, and Ovarian Cancer Your health care provider may recommend that you be screened regularly for cancer of the pelvic organs. These include your ovaries, uterus, and vagina. This screening involves a pelvic exam, which includes checking for microscopic changes to the surface of your cervix (Pap test).  For women ages 21-65, health care providers may recommend a pelvic exam and a Pap test every three years. For women ages 79-65, they may recommend the Pap test and pelvic exam, combined with testing for human papilloma virus (HPV), every five years. Some types of HPV increase your risk of cervical cancer. Testing for HPV may also be done on women of any age who have unclear Pap test results.  Other health care providers may not recommend any screening for nonpregnant women who are considered low risk for pelvic cancer and have no symptoms. Ask your health care provider if a screening pelvic exam is right for you.  If you have had past treatment for cervical cancer or a condition that could lead to cancer, you need Pap tests and screening for cancer for at least 20 years after your treatment. If Pap tests have been discontinued for you, your risk factors (such as having a new sexual partner) need to be  reassessed to determine if you should start having screenings again. Some women have medical problems that increase the chance of getting cervical cancer. In these cases, your health care provider may recommend that you have screening and Pap tests more often.  If you have a family history of uterine cancer or ovarian cancer, talk with your health care provider about genetic screening.  If you have vaginal bleeding after reaching menopause, tell your health care provider.  There are currently no reliable tests available to screen for ovarian cancer.  Lung Cancer Lung cancer screening is recommended for adults 69-62 years old who are at high risk for lung cancer because of a history of smoking. A yearly low-dose CT scan of the lungs is recommended if you:  Currently smoke.  Have a history of at least 30 pack-years of smoking and you currently smoke or have quit within the past 15 years. A pack-year is smoking an average of one pack of cigarettes per day for one year.  Yearly screening should:  Continue until it has been 15 years since you quit.  Stop if you develop a health problem that would prevent you from having lung cancer treatment.  Colorectal Cancer  This type of cancer can be detected and can often be prevented.  Routine colorectal cancer screening usually begins at  age 42 and continues through age 45.  If you have risk factors for colon cancer, your health care provider may recommend that you be screened at an earlier age.  If you have a family history of colorectal cancer, talk with your health care provider about genetic screening.  Your health care provider may also recommend using home test kits to check for hidden blood in your stool.  A small camera at the end of a tube can be used to examine your colon directly (sigmoidoscopy or colonoscopy). This is done to check for the earliest forms of colorectal cancer.  Direct examination of the colon should be repeated every  5-10 years until age 71. However, if early forms of precancerous polyps or small growths are found or if you have a family history or genetic risk for colorectal cancer, you may need to be screened more often.  Skin Cancer  Check your skin from head to toe regularly.  Monitor any moles. Be sure to tell your health care provider: ? About any new moles or changes in moles, especially if there is a change in a mole's shape or color. ? If you have a mole that is larger than the size of a pencil eraser.  If any of your family members has a history of skin cancer, especially at a young age, talk with your health care provider about genetic screening.  Always use sunscreen. Apply sunscreen liberally and repeatedly throughout the day.  Whenever you are outside, protect yourself by wearing long sleeves, pants, a wide-brimmed hat, and sunglasses.  What should I know about osteoporosis? Osteoporosis is a condition in which bone destruction happens more quickly than new bone creation. After menopause, you may be at an increased risk for osteoporosis. To help prevent osteoporosis or the bone fractures that can happen because of osteoporosis, the following is recommended:  If you are 46-71 years old, get at least 1,000 mg of calcium and at least 600 mg of vitamin D per day.  If you are older than age 55 but younger than age 65, get at least 1,200 mg of calcium and at least 600 mg of vitamin D per day.  If you are older than age 54, get at least 1,200 mg of calcium and at least 800 mg of vitamin D per day.  Smoking and excessive alcohol intake increase the risk of osteoporosis. Eat foods that are rich in calcium and vitamin D, and do weight-bearing exercises several times each week as directed by your health care provider. What should I know about how menopause affects my mental health? Depression may occur at any age, but it is more common as you become older. Common symptoms of depression  include:  Low or sad mood.  Changes in sleep patterns.  Changes in appetite or eating patterns.  Feeling an overall lack of motivation or enjoyment of activities that you previously enjoyed.  Frequent crying spells.  Talk with your health care provider if you think that you are experiencing depression. What should I know about immunizations? It is important that you get and maintain your immunizations. These include:  Tetanus, diphtheria, and pertussis (Tdap) booster vaccine.  Influenza every year before the flu season begins.  Pneumonia vaccine.  Shingles vaccine.  Your health care provider may also recommend other immunizations. This information is not intended to replace advice given to you by your health care provider. Make sure you discuss any questions you have with your health care provider. Document Released: 08/11/2005  Document Revised: 01/07/2016 Document Reviewed: 03/23/2015 Elsevier Interactive Patient Education  2018 Elsevier Inc.  

## 2017-10-17 NOTE — Progress Notes (Signed)
Patient: Debra Paul, Female    DOB: 1950/12/17, 67 y.o.   MRN: [id number] Visit Date: 10/17/2017  Today's Provider: Mar Daring, PA-C   Chief Complaint  Patient presents with  . Annual Exam   Subjective:    Annual wellness visit Debra Paul is a 67 y.o. female. She feels well. She reports exercising not regularly. She reports she is sleeping well.  Colonoscopy- 11/25/2013. Tubular adenoma. IFOBT on 10/12/2016 positive. Referred to Dr. Vira Agar. Had work up and was found to have small ulcerations throughout the small intestine.  Pap- 10/12/2016. Normal. HPV-Negative Mammogram- 05/17/2017. Normal.   Patient retiring in August 2019.   Review of Systems  Constitutional: Negative.   HENT: Negative.   Eyes: Negative.   Respiratory: Negative.   Cardiovascular: Negative.   Gastrointestinal: Negative.   Endocrine: Negative.   Genitourinary: Negative.   Musculoskeletal: Negative.   Skin: Negative.   Allergic/Immunologic: Negative.   Neurological: Negative.   Hematological: Negative.   Psychiatric/Behavioral: Negative.     Social History   Socioeconomic History  . Marital status: Single    Spouse name: Not on file  . Number of children: Not on file  . Years of education: Not on file  . Highest education level: Not on file  Occupational History  . Not on file  Social Needs  . Financial resource strain: Not on file  . Food insecurity:    Worry: Not on file    Inability: Not on file  . Transportation needs:    Medical: Not on file    Non-medical: Not on file  Tobacco Use  . Smoking status: Never Smoker  . Smokeless tobacco: Never Used  Substance and Sexual Activity  . Alcohol use: No    Comment: 2-3times a year  . Drug use: No  . Sexual activity: Not on file  Lifestyle  . Physical activity:    Days per week: Not on file    Minutes per session: Not on file  . Stress: Not on file  Relationships  . Social connections:    Talks on  phone: Not on file    Gets together: Not on file    Attends religious service: Not on file    Active member of club or organization: Not on file    Attends meetings of clubs or organizations: Not on file    Relationship status: Not on file  . Intimate partner violence:    Fear of current or ex partner: Not on file    Emotionally abused: Not on file    Physically abused: Not on file    Forced sexual activity: Not on file  Other Topics Concern  . Not on file  Social History Narrative  . Not on file    Past Medical History:  Diagnosis Date  . Diabetes mellitus without complication ([place])   . GERD (gastroesophageal reflux disease)   . Hypercholesteremia   . Kidney stones    Takes lisinopril for kidney protection  . Parathyroid disorder ([place])   . Sleep apnea    does not use CPAP  . Vertigo    in past     Patient Active Problem List   Diagnosis Date Noted  . Anemia 10/13/2015  . Hematuria 10/11/2015  . Calculus of kidney 02/10/2015  . Benign hypertension 11/23/2014  . Adult BMI 30+ 11/23/2014  . Chronic kidney disease (CKD), stage III (moderate) ([place]) 11/23/2014  . Colon polyp 11/23/2014  . Controlled type  2 diabetes mellitus with diabetic nephropathy ([place]) 11/23/2014  . Fatty infiltration of liver 11/23/2014  . Herpes zona 11/23/2014  . Hypercholesteremia 11/23/2014  . Disordered sleep 11/23/2014  . Essential (primary) hypertension 11/23/2014    Past Surgical History:  Procedure Laterality Date  . CATARACT EXTRACTION W/PHACO Left 05/14/2017   Procedure: CATARACT EXTRACTION PHACO AND INTRAOCULAR LENS PLACEMENT ([place]) LEFT DIABETIC;  Surgeon: Leandrew Koyanagi, MD;  Location: [facility];  Service: Ophthalmology;  Laterality: Left;  diabetic  . CYSTOSCOPY WITH URETEROSCOPY AND STENT PLACEMENT  09/29/2015   Dr. Bernardo Heater  . ESOPHAGOGASTRODUODENOSCOPY ENDOSCOPY    . LITHOTRIPSY  2002  . PARATHYROIDECTOMY  1990    Her family history includes Colon cancer in her  father; Heart disease in her mother. There is no history of Breast cancer.      Current Outpatient Medications:  .  ferrous sulfate 325 (65 FE) MG tablet, Take by mouth. am, Disp: , Rfl:  .  lisinopril (PRINIVIL,ZESTRIL) 20 MG tablet, Take 1 tablet (20 mg total) by mouth daily., Disp: 90 tablet, Rfl: 1 .  metFORMIN (GLUCOPHAGE) 1000 MG tablet, Take 1 tablet (1,000 mg total) by mouth 2 (two) times daily., Disp: 180 tablet, Rfl: 1 .  pravastatin (PRAVACHOL) 20 MG tablet, Take 1 tablet (20 mg total) by mouth at bedtime., Disp: 90 tablet, Rfl: 1 .  ranitidine (ZANTAC) 150 MG tablet, Take 150 mg by mouth 2 (two) times daily., Disp: , Rfl: 3 .  sitaGLIPtin (JANUVIA) 50 MG tablet, Take 1 tablet (50 mg total) by mouth daily., Disp: 90 tablet, Rfl: 1 .  potassium citrate (UROCIT-K) 10 MEQ (1080 MG) SR tablet, Take daily by mouth. am, Disp: , Rfl:  .  triamcinolone cream (KENALOG) 0.1 %, Apply 1 application topically 2 (two) times daily. (Patient not taking: Reported on 10/12/2016), Disp: 30 g, Rfl: 0  Patient Care Team: Mar Daring, PA-C as PCP - General (Family Medicine)     Objective:   Vitals: BP 134/82 (BP Location: Left Arm, Patient Position: Sitting, Cuff Size: Large)   Pulse 72   Temp 97.9 F (36.6 C)   Resp 16   Ht 5\' 9"  (1.753 m)   Wt 223 lb (101.2 kg)   SpO2 98%   BMI 32.93 kg/m   Physical Exam  Constitutional: She is oriented to person, place, and time. She appears well-developed and well-nourished. No distress.  HENT:  Head: Normocephalic and atraumatic.  Right Ear: Hearing, tympanic membrane, external ear and ear canal normal.  Left Ear: Hearing, tympanic membrane, external ear and ear canal normal.  Nose: Nose normal.  Mouth/Throat: Uvula is midline, oropharynx is clear and moist and mucous membranes are normal. No oropharyngeal exudate.  Eyes: Pupils are equal, round, and reactive to light. Conjunctivae and EOM are normal. Right eye exhibits no discharge. Left eye  exhibits no discharge. No scleral icterus.  Neck: Normal range of motion. Neck supple. No JVD present. Carotid bruit is not present. No tracheal deviation present. No thyromegaly present.  Cardiovascular: Normal rate, regular rhythm and intact distal pulses. Exam reveals no gallop and no friction rub.  Murmur heard.  Systolic (heard best over RSB) murmur is present with a grade of 2/6. Pulses:      Dorsalis pedis pulses are 2+ on the right side, and 2+ on the left side.       Posterior tibial pulses are 2+ on the right side, and 2+ on the left side.  Pulmonary/Chest: Effort normal and breath sounds  normal. No respiratory distress. She has no wheezes. She has no rales. She exhibits no tenderness.  Abdominal: Soft. Bowel sounds are normal. She exhibits no distension and no mass. There is no tenderness. There is no rebound and no guarding.  Musculoskeletal: She exhibits no edema or tenderness.       Right foot: There is normal range of motion and no deformity.       Left foot: There is no deformity.  Feet:  Right Foot:  Protective Sensation: 5 sites tested. 5 sites sensed.  Skin Integrity: Negative for ulcer, blister, skin breakdown, erythema, warmth, callus or dry skin.  Left Foot:  Protective Sensation: 5 sites tested. 5 sites sensed.  Skin Integrity: Negative for ulcer, blister, skin breakdown, erythema, warmth, callus or dry skin.  Lymphadenopathy:    She has no cervical adenopathy.  Neurological: She is alert and oriented to person, place, and time.  Skin: Skin is warm and dry. No rash noted. She is not diaphoretic.  Psychiatric: She has a normal mood and affect. Her behavior is normal. Judgment and thought content normal.  Vitals reviewed.   Activities of Daily Living In your present state of health, do you have any difficulty performing the following activities: 05/14/2017  Hearing? N  Vision? N  Difficulty concentrating or making decisions? N  Walking or climbing stairs? N    Dressing or bathing? N  Some recent data might be hidden    Fall Risk Assessment Fall Risk  10/17/2017 10/12/2016  Falls in the past year? No No     Depression Screen PHQ 2/9 Scores 10/17/2017 10/12/2016 10/11/2015  PHQ - 2 Score 0 0 0  PHQ- 9 Score - 0 -    Cognitive Testing - 6-CIT  Correct? Score   What year is it? yes 0 0 or 4  What month is it? yes 0 0 or 3  Memorize:    Pia Mau,  43,  [address],  [place],      What time is it? (within 1 hour) yes 0 0 or 3  Count backwards from 20 yes 0 0, 2, or 4  Name the months of the year yes 0 0, 2, or 4  Repeat name & address above yes 0 0, 2, 4, 6, 8, or 10       TOTAL SCORE  0/28   Interpretation:  Normal  Normal (0-7) Abnormal (8-28)       Assessment & Plan:     Annual Wellness Visit  Reviewed patient's Family Medical History Reviewed and updated list of patient's medical providers Assessment of cognitive impairment was done Assessed patient's functional ability Established a written schedule for health screening [facility] Completed and Reviewed  Exercise Activities and Dietary recommendations Goals    None      Immunization History  Administered Date(s) Administered  . Influenza-Unspecified 04/08/2015  . Pneumococcal Conjugate-13 10/12/2016  . Tdap 09/04/2011    Health Maintenance  Topic Date Due  . FOOT EXAM  10/10/2016  . HEMOGLOBIN A1C  04/13/2017  . PNA vac Low Risk Adult (2 of 2 - PPSV23) 10/12/2017  . INFLUENZA VACCINE  01/31/2018  . OPHTHALMOLOGY EXAM  05/04/2018  . MAMMOGRAM  05/18/2019  . TETANUS/TDAP  09/03/2021  . COLONOSCOPY  11/26/2023  . DEXA SCAN  Completed  . Hepatitis C Screening  Completed     Discussed health benefits of physical activity, and encouraged her to engage in regular exercise  1. Annual physical exam  Normal physical exam today. Will check labs as below and f/u pending lab results. If labs are stable and WNL she will not need to have these  rechecked for one year at her next annual physical exam. She is to call the office in the meantime if she has any acute issue, questions or concerns. - CBC w/Diff/Platelet - Comprehensive Metabolic Panel (CMET) - TSH - Lipid Profile - HgB A1c  2. Benign hypertension Stable. Continue lisinopril 20mg . Will check labs as below and f/u pending results. - CBC w/Diff/Platelet - Comprehensive Metabolic Panel (CMET) - TSH - Lipid Profile - HgB A1c  3. Fatty infiltration of liver Diet controlled. Will check labs as below and f/u pending results. - CBC w/Diff/Platelet - Comprehensive Metabolic Panel (CMET) - TSH - Lipid Profile - HgB A1c  4. Controlled type 2 diabetes mellitus with diabetic nephropathy, without long-term current use of insulin (HCC) Stable. Continue Januvia 50mg  daily and Metformin 1000mg  BID. Will check labs as below and f/u pending results. - CBC w/Diff/Platelet - Comprehensive Metabolic Panel (CMET) - TSH - Lipid Profile - HgB A1c  5. Chronic kidney disease (CKD), stage III (moderate) (H[facility]) Stable. Followed by Dr. Johnney Ou. - CBC w/Diff/Platelet - Comprehensive Metabolic Panel (CMET) - TSH - Lipid Profile - HgB A1c  6. BMI 32.0-32.9,adult Counseled patient on healthy lifestyle modifications including dieting and exercise.  - CBC w/Diff/Platelet - Comprehensive Metabolic Panel (CMET) - TSH - Lipid Profile - HgB A1c  7. Need for Streptococcus pneumoniae vaccination Pneumovax 23 Vaccine given to patient without complications. Patient sat for 15 minutes after administration and was tolerated well without adverse effects. - Pneumococcal polysaccharide vaccine 23-valent greater than or equal to 2yo subcutaneous/IM   Mar Daring, PA-C  [place]

## 2017-10-18 ENCOUNTER — Telehealth: Payer: Self-pay | Admitting: *Deleted

## 2017-10-18 LAB — CBC WITH DIFFERENTIAL/PLATELET
Basophils Absolute: 0 x10E3/uL (ref 0.0–0.2)
Basos: 0 %
EOS (ABSOLUTE): 0.2 x10E3/uL (ref 0.0–0.4)
Eos: 3 %
Hematocrit: 33 % — ABNORMAL LOW (ref 34.0–46.6)
Hemoglobin: 10.6 g/dL — ABNORMAL LOW (ref 11.1–15.9)
Immature Grans (Abs): 0.1 x10E3/uL (ref 0.0–0.1)
Immature Granulocytes: 1 %
Lymphocytes Absolute: 1.8 x10E3/uL (ref 0.7–3.1)
Lymphs: 26 %
MCH: 27.2 pg (ref 26.6–33.0)
MCHC: 32.1 g/dL (ref 31.5–35.7)
MCV: 85 fL (ref 79–97)
Monocytes Absolute: 0.6 x10E3/uL (ref 0.1–0.9)
Monocytes: 8 %
Neutrophils Absolute: 4.3 x10E3/uL (ref 1.4–7.0)
Neutrophils: 62 %
Platelets: 174 x10E3/uL (ref 150–379)
RBC: 3.89 x10E6/uL (ref 3.77–5.28)
RDW: 14.9 % (ref 12.3–15.4)
WBC: 7 x10E3/uL (ref 3.4–10.8)

## 2017-10-18 LAB — COMPREHENSIVE METABOLIC PANEL WITH GFR
ALT: 11 IU/L (ref 0–32)
AST: 16 IU/L (ref 0–40)
Albumin/Globulin Ratio: 1.8 (ref 1.2–2.2)
Albumin: 4.4 g/dL (ref 3.6–4.8)
Alkaline Phosphatase: 90 IU/L (ref 39–117)
BUN/Creatinine Ratio: 20 (ref 12–28)
BUN: 42 mg/dL — ABNORMAL HIGH (ref 8–27)
Bilirubin Total: <0.2 mg/dL (ref 0.0–1.2)
CO2: 16 mmol/L — ABNORMAL LOW (ref 20–29)
Calcium: 8.9 mg/dL (ref 8.7–10.3)
Chloride: 107 mmol/L — ABNORMAL HIGH (ref 96–106)
Creatinine, Ser: 2.13 mg/dL — ABNORMAL HIGH (ref 0.57–1.00)
GFR calc Af Amer: 27 mL/min/1.73 — ABNORMAL LOW
GFR calc non Af Amer: 24 mL/min/1.73 — ABNORMAL LOW
Globulin, Total: 2.4 g/dL (ref 1.5–4.5)
Glucose: 106 mg/dL — ABNORMAL HIGH (ref 65–99)
Potassium: 4.7 mmol/L (ref 3.5–5.2)
Sodium: 138 mmol/L (ref 134–144)
Total Protein: 6.8 g/dL (ref 6.0–8.5)

## 2017-10-18 LAB — TSH: TSH: 2.21 u[IU]/mL (ref 0.450–4.500)

## 2017-10-18 LAB — LIPID PANEL
Chol/HDL Ratio: 5.3 ratio — ABNORMAL HIGH (ref 0.0–4.4)
Cholesterol, Total: 181 mg/dL (ref 100–199)
HDL: 34 mg/dL — ABNORMAL LOW
LDL Calculated: 81 mg/dL (ref 0–99)
Triglycerides: 330 mg/dL — ABNORMAL HIGH (ref 0–149)
VLDL Cholesterol Cal: 66 mg/dL — ABNORMAL HIGH (ref 5–40)

## 2017-10-18 LAB — HEMOGLOBIN A1C
Est. average glucose Bld gHb Est-mCnc: 137 mg/dL
Hgb A1c MFr Bld: 6.4 % — ABNORMAL HIGH (ref 4.8–5.6)

## 2017-10-18 NOTE — Telephone Encounter (Signed)
-----   Message from Mar Daring, PA-C sent at 10/18/2017 10:00 AM EDT ----- A1c has improved from 6.8 to now 6.4. Cholesterol has improved as well. Thyroid normal. Kidney function stable. Liver function is normal. Hemoglobin slightly down from previous. Continue all medications as prescribed and make sure to be taking iron daily as well.

## 2017-10-18 NOTE — Telephone Encounter (Signed)
LMOVM for pt to return call 

## 2017-10-19 ENCOUNTER — Other Ambulatory Visit: Payer: Self-pay | Admitting: Physician Assistant

## 2017-10-19 DIAGNOSIS — I10 Essential (primary) hypertension: Secondary | ICD-10-CM

## 2017-10-19 DIAGNOSIS — E1121 Type 2 diabetes mellitus with diabetic nephropathy: Secondary | ICD-10-CM

## 2017-10-19 DIAGNOSIS — E7800 Pure hypercholesterolemia, unspecified: Secondary | ICD-10-CM

## 2017-10-19 MED ORDER — METFORMIN HCL 1000 MG PO TABS: 1000.0000 mg | ORAL_TABLET | Freq: Two times a day (BID) | ORAL | 1 refills | Status: DC

## 2017-10-19 MED ORDER — LISINOPRIL 20 MG PO TABS: 20.0000 mg | ORAL_TABLET | Freq: Every day | ORAL | 1 refills | Status: DC

## 2017-10-19 MED ORDER — PRAVASTATIN SODIUM 20 MG PO TABS: 20.0000 mg | ORAL_TABLET | Freq: Every day | ORAL | 1 refills | Status: DC

## 2017-10-19 MED ORDER — SITAGLIPTIN PHOSPHATE 50 MG PO TABS: 50.0000 mg | ORAL_TABLET | Freq: Every day | ORAL | 1 refills | Status: DC

## 2017-10-19 NOTE — Telephone Encounter (Signed)
[facility] faxed refill request for the following medications:  1. sitaGLIPtin (JANUVIA) 50 MG tablet 2. pravastatin (PRAVACHOL) 20 MG tablet 3. lisinopril (PRINIVIL,ZESTRIL) 20 MG tablet 4. metFORMIN (GLUCOPHAGE) 1000 MG tablet  90 day supply  I spoke with pt and confirmed that she was now using [facility] because it wasn't on pt's preferred pharmacy list. Pt confirmed that she is and added it her pharmacy list. [facility] sent request earlier this week and the Rx were sent to [facility] instead. Pt is requesting the Rx be sent in today if possible because she is about out of the medications. Please advise. Thanks TNP

## 2017-10-19 NOTE — Telephone Encounter (Signed)
Medications were just sent into [facility] on Tuesday (4/16). Please send refills into mail order pharmacy. Thanks!

## 2017-10-19 NOTE — Telephone Encounter (Signed)
pt returned call  Thanks teri

## 2017-10-24 NOTE — Telephone Encounter (Signed)
Lab results sent to patient via mychart since unable to contact by phone.

## 2017-10-24 NOTE — Telephone Encounter (Signed)
Tried calling patient. Left message to call back. 

## 2017-11-09 LAB — HM DIABETES EYE EXAM

## 2017-11-13 LAB — HM DIABETES EYE EXAM

## 2017-11-14 ENCOUNTER — Encounter: Payer: Self-pay | Admitting: Physician Assistant

## 2018-02-21 ENCOUNTER — Other Ambulatory Visit: Payer: Self-pay | Admitting: Physician Assistant

## 2018-02-21 DIAGNOSIS — E7800 Pure hypercholesterolemia, unspecified: Secondary | ICD-10-CM

## 2018-03-10 ENCOUNTER — Other Ambulatory Visit: Payer: Self-pay | Admitting: Physician Assistant

## 2018-03-10 DIAGNOSIS — E1121 Type 2 diabetes mellitus with diabetic nephropathy: Secondary | ICD-10-CM

## 2018-03-18 ENCOUNTER — Other Ambulatory Visit: Payer: Self-pay | Admitting: Physician Assistant

## 2018-03-18 DIAGNOSIS — E1121 Type 2 diabetes mellitus with diabetic nephropathy: Secondary | ICD-10-CM

## 2018-03-18 DIAGNOSIS — I10 Essential (primary) hypertension: Secondary | ICD-10-CM

## 2018-03-18 MED ORDER — METFORMIN HCL 1000 MG PO TABS: 1000.0000 mg | ORAL_TABLET | Freq: Two times a day (BID) | ORAL | 1 refills | Status: DC

## 2018-03-18 NOTE — Telephone Encounter (Signed)
[facility] pharmacy faxed a refill request for the following medication. Thanks CC  metFORMIN (GLUCOPHAGE) 1000 MG tablet

## 2018-04-04 ENCOUNTER — Other Ambulatory Visit: Payer: Self-pay | Admitting: Physician Assistant

## 2018-04-04 DIAGNOSIS — Z1231 Encounter for screening mammogram for malignant neoplasm of breast: Secondary | ICD-10-CM

## 2018-04-17 ENCOUNTER — Ambulatory Visit (INDEPENDENT_AMBULATORY_CARE_PROVIDER_SITE_OTHER): Payer: Medicare Other

## 2018-04-17 DIAGNOSIS — Z23 Encounter for immunization: Secondary | ICD-10-CM | POA: Diagnosis not present

## 2018-04-22 ENCOUNTER — Telehealth: Payer: Self-pay | Admitting: Physician Assistant

## 2018-04-22 DIAGNOSIS — E1122 Type 2 diabetes mellitus with diabetic chronic kidney disease: Secondary | ICD-10-CM

## 2018-04-22 DIAGNOSIS — N183 Chronic kidney disease, stage 3 unspecified: Secondary | ICD-10-CM

## 2018-04-22 NOTE — Telephone Encounter (Signed)
Pt needing orders to have her A1C checked.  Thanks, [employer]

## 2018-04-22 NOTE — Telephone Encounter (Signed)
Patient advised lab requisition placed up front

## 2018-04-22 NOTE — Telephone Encounter (Signed)
Lab slip printed. 

## 2018-04-22 NOTE — Telephone Encounter (Signed)
Does she need a appointment or can I just place the lab?

## 2018-04-24 LAB — CBC WITH DIFFERENTIAL/PLATELET
Basophils Absolute: 0 10*3/uL (ref 0.0–0.2)
Basos: 1 %
EOS (ABSOLUTE): 0.2 10*3/uL (ref 0.0–0.4)
Eos: 4 %
Hematocrit: 31.7 % — ABNORMAL LOW (ref 34.0–46.6)
Hemoglobin: 10.5 g/dL — ABNORMAL LOW (ref 11.1–15.9)
Immature Grans (Abs): 0.1 10*3/uL (ref 0.0–0.1)
Immature Granulocytes: 2 %
Lymphocytes Absolute: 1.5 10*3/uL (ref 0.7–3.1)
Lymphs: 25 %
MCH: 27.7 pg (ref 26.6–33.0)
MCHC: 33.1 g/dL (ref 31.5–35.7)
MCV: 84 fL (ref 79–97)
Monocytes Absolute: 0.5 10*3/uL (ref 0.1–0.9)
Monocytes: 8 %
Neutrophils Absolute: 3.5 10*3/uL (ref 1.4–7.0)
Neutrophils: 60 %
Platelets: 159 10*3/uL (ref 150–450)
RBC: 3.79 x10E6/uL (ref 3.77–5.28)
RDW: 13.3 % (ref 12.3–15.4)
WBC: 5.8 10*3/uL (ref 3.4–10.8)

## 2018-04-24 LAB — HEMOGLOBIN A1C
Est. average glucose Bld gHb Est-mCnc: 148 mg/dL
Hgb A1c MFr Bld: 6.8 % — ABNORMAL HIGH (ref 4.8–5.6)

## 2018-05-20 ENCOUNTER — Ambulatory Visit
Admission: RE | Admit: 2018-05-20 | Discharge: 2018-05-20 | Disposition: A | Discharge status: Home / Self Care | Payer: Medicare Other | Source: Ambulatory Visit | Attending: Physician Assistant | Admitting: Physician Assistant

## 2018-05-20 DIAGNOSIS — Z1231 Encounter for screening mammogram for malignant neoplasm of breast: Secondary | ICD-10-CM | POA: Insufficient documentation

## 2018-06-17 ENCOUNTER — Inpatient Hospital Stay
Admission: EM | Admit: 2018-06-17 | Discharge: 2018-06-22 | DRG: 871 | Disposition: A | Discharge status: Home Health | Payer: Medicare Other | Attending: Internal Medicine | Admitting: Internal Medicine

## 2018-06-17 ENCOUNTER — Emergency Department: Payer: Medicare Other

## 2018-06-17 ENCOUNTER — Other Ambulatory Visit: Payer: Self-pay

## 2018-06-17 DIAGNOSIS — E872 Acidosis: Secondary | ICD-10-CM | POA: Diagnosis present

## 2018-06-17 DIAGNOSIS — N179 Acute kidney failure, unspecified: Secondary | ICD-10-CM | POA: Diagnosis present

## 2018-06-17 DIAGNOSIS — K219 Gastro-esophageal reflux disease without esophagitis: Secondary | ICD-10-CM | POA: Diagnosis present

## 2018-06-17 DIAGNOSIS — N2581 Secondary hyperparathyroidism of renal origin: Secondary | ICD-10-CM | POA: Diagnosis present

## 2018-06-17 DIAGNOSIS — N390 Urinary tract infection, site not specified: Secondary | ICD-10-CM | POA: Diagnosis present

## 2018-06-17 DIAGNOSIS — A419 Sepsis, unspecified organism: Secondary | ICD-10-CM

## 2018-06-17 DIAGNOSIS — Z8619 Personal history of other infectious and parasitic diseases: Secondary | ICD-10-CM | POA: Diagnosis present

## 2018-06-17 DIAGNOSIS — Z452 Encounter for adjustment and management of vascular access device: Secondary | ICD-10-CM

## 2018-06-17 DIAGNOSIS — R6521 Severe sepsis with septic shock: Secondary | ICD-10-CM | POA: Diagnosis present

## 2018-06-17 DIAGNOSIS — I120 Hypertensive chronic kidney disease with stage 5 chronic kidney disease or end stage renal disease: Secondary | ICD-10-CM | POA: Diagnosis present

## 2018-06-17 DIAGNOSIS — E875 Hyperkalemia: Secondary | ICD-10-CM

## 2018-06-17 DIAGNOSIS — R6520 Severe sepsis without septic shock: Secondary | ICD-10-CM

## 2018-06-17 DIAGNOSIS — N186 End stage renal disease: Secondary | ICD-10-CM | POA: Diagnosis present

## 2018-06-17 DIAGNOSIS — Z79899 Other long term (current) drug therapy: Secondary | ICD-10-CM

## 2018-06-17 DIAGNOSIS — Z7982 Long term (current) use of aspirin: Secondary | ICD-10-CM

## 2018-06-17 DIAGNOSIS — E860 Dehydration: Secondary | ICD-10-CM | POA: Diagnosis present

## 2018-06-17 DIAGNOSIS — Z6834 Body mass index (BMI) 34.0-34.9, adult: Secondary | ICD-10-CM

## 2018-06-17 DIAGNOSIS — Z888 Allergy status to other drugs, medicaments and biological substances status: Secondary | ICD-10-CM

## 2018-06-17 DIAGNOSIS — E1122 Type 2 diabetes mellitus with diabetic chronic kidney disease: Secondary | ICD-10-CM | POA: Diagnosis present

## 2018-06-17 DIAGNOSIS — E7800 Pure hypercholesterolemia, unspecified: Secondary | ICD-10-CM | POA: Diagnosis present

## 2018-06-17 DIAGNOSIS — G9341 Metabolic encephalopathy: Secondary | ICD-10-CM | POA: Diagnosis present

## 2018-06-17 DIAGNOSIS — K5900 Constipation, unspecified: Secondary | ICD-10-CM | POA: Diagnosis present

## 2018-06-17 DIAGNOSIS — R319 Hematuria, unspecified: Secondary | ICD-10-CM

## 2018-06-17 DIAGNOSIS — J9601 Acute respiratory failure with hypoxia: Secondary | ICD-10-CM | POA: Diagnosis present

## 2018-06-17 DIAGNOSIS — N200 Calculus of kidney: Secondary | ICD-10-CM | POA: Diagnosis present

## 2018-06-17 DIAGNOSIS — E876 Hypokalemia: Secondary | ICD-10-CM | POA: Diagnosis present

## 2018-06-17 DIAGNOSIS — G4733 Obstructive sleep apnea (adult) (pediatric): Secondary | ICD-10-CM | POA: Diagnosis present

## 2018-06-17 DIAGNOSIS — Z881 Allergy status to other antibiotic agents status: Secondary | ICD-10-CM

## 2018-06-17 DIAGNOSIS — R4182 Altered mental status, unspecified: Secondary | ICD-10-CM

## 2018-06-17 DIAGNOSIS — J9602 Acute respiratory failure with hypercapnia: Secondary | ICD-10-CM | POA: Diagnosis present

## 2018-06-17 LAB — COMPREHENSIVE METABOLIC PANEL
ALT: 16 U/L (ref 0–44)
AST: 11 U/L — ABNORMAL LOW (ref 15–41)
Albumin: 3.8 g/dL (ref 3.5–5.0)
Alkaline Phosphatase: 96 U/L (ref 38–126)
BUN: 186 mg/dL — ABNORMAL HIGH (ref 8–23)
CO2: <7 mmol/L — ABNORMAL LOW (ref 22–32)
Calcium: 8 mg/dL — ABNORMAL LOW (ref 8.9–10.3)
Chloride: 114 mmol/L — ABNORMAL HIGH (ref 98–111)
Creatinine, Ser: 12.51 mg/dL — ABNORMAL HIGH (ref 0.44–1.00)
GFR calc Af Amer: 3 mL/min — ABNORMAL LOW (ref >60)
GFR calc non Af Amer: 3 mL/min — ABNORMAL LOW (ref >60)
Glucose, Bld: 171 mg/dL — ABNORMAL HIGH (ref 70–99)
Potassium: 7.3 mmol/L (ref 3.5–5.1)
Sodium: 134 mmol/L — ABNORMAL LOW (ref 135–145)
Total Bilirubin: 0.6 mg/dL (ref 0.3–1.2)
Total Protein: 7.2 g/dL (ref 6.5–8.1)

## 2018-06-17 LAB — INFLUENZA PANEL BY PCR (TYPE A & B)
Influenza A By PCR: NEGATIVE
Influenza B By PCR: NEGATIVE

## 2018-06-17 LAB — CBC WITH DIFFERENTIAL/PLATELET
Abs Immature Granulocytes: 1.21 10*3/uL — ABNORMAL HIGH (ref 0.00–0.07)
Basophils Absolute: 0.1 10*3/uL (ref 0.0–0.1)
Basophils Relative: 1 %
Eosinophils Absolute: 0.1 10*3/uL (ref 0.0–0.5)
Eosinophils Relative: 1 %
HCT: 34 % — ABNORMAL LOW (ref 36.0–46.0)
Hemoglobin: 10.5 g/dL — ABNORMAL LOW (ref 12.0–15.0)
Immature Granulocytes: 8 %
Lymphocytes Relative: 13 %
Lymphs Abs: 2 10*3/uL (ref 0.7–4.0)
MCH: 27.8 pg (ref 26.0–34.0)
MCHC: 30.9 g/dL (ref 30.0–36.0)
MCV: 89.9 fL (ref 80.0–100.0)
Monocytes Absolute: 1.2 10*3/uL — ABNORMAL HIGH (ref 0.1–1.0)
Monocytes Relative: 8 %
Neutro Abs: 10.8 10*3/uL — ABNORMAL HIGH (ref 1.7–7.7)
Neutrophils Relative %: 69 %
Platelets: 143 10*3/uL — ABNORMAL LOW (ref 150–400)
RBC: 3.78 MIL/uL — ABNORMAL LOW (ref 3.87–5.11)
RDW: 15.5 % (ref 11.5–15.5)
WBC: 15.3 10*3/uL — ABNORMAL HIGH (ref 4.0–10.5)
nRBC: 0.5 % — ABNORMAL HIGH (ref 0.0–0.2)

## 2018-06-17 LAB — URINALYSIS, COMPLETE (UACMP) WITH MICROSCOPIC
Bilirubin Urine: NEGATIVE
Glucose, UA: NEGATIVE mg/dL
Ketones, ur: NEGATIVE mg/dL
Nitrite: NEGATIVE
Protein, ur: 100 mg/dL — AB
Specific Gravity, Urine: 1.002 — ABNORMAL LOW (ref 1.005–1.030)
Squamous Epithelial / HPF: NONE SEEN (ref 0–5)
WBC, UA: >50 WBC/hpf — ABNORMAL HIGH (ref 0–5)
pH: 5 (ref 5.0–8.0)

## 2018-06-17 LAB — POCT I-STAT, CHEM 8
BUN: >140 mg/dL — ABNORMAL HIGH (ref 8–23)
Calcium, Ion: 1.12 mmol/L — ABNORMAL LOW (ref 1.15–1.40)
Chloride: 119 mmol/L — ABNORMAL HIGH (ref 98–111)
Creatinine, Ser: 15.6 mg/dL — ABNORMAL HIGH (ref 0.44–1.00)
Glucose, Bld: 164 mg/dL — ABNORMAL HIGH (ref 70–99)
HCT: 33 % — ABNORMAL LOW (ref 36.0–46.0)
Hemoglobin: 11.2 g/dL — ABNORMAL LOW (ref 12.0–15.0)
Potassium: 7.6 mmol/L (ref 3.5–5.1)
Sodium: 134 mmol/L — ABNORMAL LOW (ref 135–145)
TCO2: 6 mmol/L — ABNORMAL LOW (ref 22–32)

## 2018-06-17 LAB — CG4 I-STAT (LACTIC ACID): Lactic Acid, Venous: 0.57 mmol/L (ref 0.5–1.9)

## 2018-06-17 LAB — GLUCOSE, CAPILLARY: Glucose-Capillary: 204 mg/dL — ABNORMAL HIGH (ref 70–99)

## 2018-06-17 LAB — TSH: TSH: 1.188 u[IU]/mL (ref 0.350–4.500)

## 2018-06-17 LAB — BRAIN NATRIURETIC PEPTIDE: B Natriuretic Peptide: 99 pg/mL (ref 0.0–100.0)

## 2018-06-17 MED ORDER — SODIUM BICARBONATE 8.4 % IV SOLN
50.0000 meq | Freq: Once | INTRAVENOUS | Status: AC
  Administered 2018-06-17: 50 meq via INTRAVENOUS
  Filled 2018-06-17: qty 50

## 2018-06-17 MED ORDER — SODIUM CHLORIDE 0.9 % IV BOLUS
1000.0000 mL | Freq: Once | INTRAVENOUS | Status: AC
  Administered 2018-06-17: 1000 mL via INTRAVENOUS

## 2018-06-17 MED ORDER — CHLORHEXIDINE GLUCONATE CLOTH 2 % EX PADS
6.0000 | MEDICATED_PAD | Freq: Every day | CUTANEOUS | Status: DC
  Administered 2018-06-19 – 2018-06-22 (×4): 6 via TOPICAL
  Filled 2018-06-17: qty 6

## 2018-06-17 MED ORDER — SODIUM CHLORIDE 0.9 % IV SOLN: 1.0000 g | Freq: Once | INTRAVENOUS | Status: DC

## 2018-06-17 MED ORDER — INSULIN ASPART 100 UNIT/ML ~~LOC~~ SOLN
5.0000 [IU] | Freq: Once | SUBCUTANEOUS | Status: AC
  Administered 2018-06-17: 5 [IU] via INTRAVENOUS
  Filled 2018-06-17: qty 1

## 2018-06-17 MED ORDER — LORAZEPAM 2 MG/ML IJ SOLN
INTRAMUSCULAR | Status: AC
  Administered 2018-06-17: 1 mg
  Filled 2018-06-17: qty 1

## 2018-06-17 MED ORDER — DEXTROSE 50 % IV SOLN
1.0000 | Freq: Once | INTRAVENOUS | Status: AC
  Administered 2018-06-17: 50 mL via INTRAVENOUS
  Filled 2018-06-17: qty 50

## 2018-06-17 MED ORDER — SODIUM CHLORIDE 0.9 % IV SOLN
1.0000 g | Freq: Once | INTRAVENOUS | Status: AC
  Administered 2018-06-17: 1 g via INTRAVENOUS
  Filled 2018-06-17: qty 10

## 2018-06-17 MED ORDER — SODIUM CHLORIDE 0.9 % IV SOLN
1.0000 g | Freq: Once | INTRAVENOUS | Status: AC
  Administered 2018-06-17: 1 g via INTRAVENOUS
  Filled 2018-06-17: qty 1

## 2018-06-17 MED ORDER — ACETAMINOPHEN 10 MG/ML IV SOLN
1000.0000 mg | Freq: Once | INTRAVENOUS | Status: AC
  Administered 2018-06-17: 1000 mg via INTRAVENOUS
  Filled 2018-06-17: qty 100

## 2018-06-17 NOTE — Op Note (Signed)
OPERATIVE NOTE   PROCEDURE: 1. Insertion of triple-lumen central venous catheter right IJ approach.  PRE-OPERATIVE DIAGNOSIS: Sepsis with acute renal failure and hyperkalemia life-threatening  POST-OPERATIVE DIAGNOSIS: Same  SURGEON: Katha Cabal M.D.  ANESTHESIA: 1% lidocaine local infiltration  ESTIMATED BLOOD LOSS: Minimal cc  INDICATIONS:   Debra Paul is a 67 y.o. female who presents with sepsis complicated with acute renal failure and hyperkalemia which is life-threatening.  DESCRIPTION: After obtaining full informed written consent, the patient was positioned supine. The right neck was prepped and draped in a sterile fashion. Ultrasound was placed in a sterile sleeve. Ultrasound was utilized to identify the right internal jugular vein which is noted to be echolucent and compressible indicating patency. Images recorded for the permanent record. Under real-time visualization a Seldinger needle is inserted into the vein and the guidewires advanced without difficulty. Small counterincision was made at the wire insertion site. Dilators passed over the wire and the triple-lumen catheter is fed without difficulty.  All 3 lm aspirate and flush easily and are packed with heparin saline. Catheter secured to the skin of the right neck with 2-0 silk. A sterile dressing is applied with Biopatch.  COMPLICATIONS: None  CONDITION: Guarded but unchanged  Katha Cabal, M.D. [place]. Office:  [phone]   06/17/2018, 11:37 PM

## 2018-06-17 NOTE — ED Provider Notes (Signed)
[facility] Emergency Department Provider Note   ____________________________________________   First MD Initiated Contact with Patient 06/17/18 1944     (approximate)  I have reviewed the triage vital signs and the nursing notes.   HISTORY  Chief Complaint Code Sepsis  History limited by patient not being able to speak HPI Debra Paul is a 67 y.o. female who comes in via EMS.  History is obtained from EMS and from her son.  Her son reports that the patient has not been feeling well for several days she thought maybe she had the flu.  He checked on her yesterday she looked a little dry he was encouraging her to drink fluids but she was awake and talking and just looked slightly ill.  Today he went back to look check on her and she is lying in bed staring at the ceiling.  On arrival here she only say yes or sometimes no she does move her arm spontaneously she does move her feet but she cannot pick her legs up off the bed.  I-STAT potassium is 7.  Her EKG does not look like hyperkalemia however will wait for her regular labs to come back but we are giving her 2 L of fluid stat.  Possibly this is due to hemolysis is difficult to tell there is no sign of any arrhythmia   Past Medical History:  Diagnosis Date  . Diabetes mellitus without complication ([place])   . GERD (gastroesophageal reflux disease)   . Hypercholesteremia   . Kidney stones    Takes lisinopril for kidney protection  . Parathyroid disorder ([place])   . Sleep apnea    does not use CPAP  . Vertigo    in past    Patient Active Problem List   Diagnosis Date Noted  . Anemia 10/13/2015  . Hematuria 10/11/2015  . Calculus of kidney 02/10/2015  . Benign hypertension 11/23/2014  . Adult BMI 30+ 11/23/2014  . Chronic kidney disease (CKD), stage III (moderate) ([place]) 11/23/2014  . Colon polyp 11/23/2014  . Controlled type 2 diabetes mellitus with diabetic nephropathy ([place]) 11/23/2014  . Fatty  infiltration of liver 11/23/2014  . Herpes zona 11/23/2014  . Hypercholesteremia 11/23/2014  . Disordered sleep 11/23/2014  . Essential (primary) hypertension 11/23/2014    Past Surgical History:  Procedure Laterality Date  . CATARACT EXTRACTION W/PHACO Left 05/14/2017   Procedure: CATARACT EXTRACTION PHACO AND INTRAOCULAR LENS PLACEMENT ([place]) LEFT DIABETIC;  Surgeon: Leandrew Koyanagi, MD;  Location: [facility];  Service: Ophthalmology;  Laterality: Left;  diabetic  . CYSTOSCOPY WITH URETEROSCOPY AND STENT PLACEMENT  09/29/2015   Dr. Bernardo Heater  . ESOPHAGOGASTRODUODENOSCOPY ENDOSCOPY    . LITHOTRIPSY  2002  . PARATHYROIDECTOMY  1990    Prior to Admission medications   Medication Sig Start Date End Date Taking? Authorizing Provider  ferrous sulfate 325 (65 FE) MG tablet Take by mouth. am    [provider]  JANUVIA 50 MG tablet TAKE 1 TABLET BY MOUTH  DAILY 03/11/18   Mar Daring, PA-C  lisinopril (PRINIVIL,ZESTRIL) 20 MG tablet TAKE 1 TABLET BY MOUTH  DAILY 03/18/18   Mar Daring, PA-C  metFORMIN (GLUCOPHAGE) 1000 MG tablet Take 1 tablet (1,000 mg total) by mouth 2 (two) times daily. 03/18/18   Mar Daring, PA-C  potassium citrate (UROCIT-K) 10 MEQ (1080 MG) SR tablet Take daily by mouth. am 11/25/15   [provider]  pravastatin (PRAVACHOL) 20 MG tablet TAKE 1 TABLET  BY MOUTH AT  BEDTIME 02/21/18   Mar Daring, PA-C  ranitidine (ZANTAC) 150 MG tablet Take 150 mg by mouth 2 (two) times daily. 07/03/16   [provider]  triamcinolone cream (KENALOG) 0.1 % Apply 1 application topically 2 (two) times daily. Patient not taking: Reported on 10/12/2016 09/19/16   Mar Daring, PA-C    Allergies Ciprofloxacin  Family History  Problem Relation Age of Onset  . Colon cancer Father   . Heart disease Mother   . Breast cancer Neg Hx     Social History Social History   Tobacco Use  . Smoking status: Never Smoker    . Smokeless tobacco: Never Used  Substance Use Topics  . Alcohol use: No    Comment: 2-3times a year  . Drug use: No    Review of Systems  Unable to obtain ____________________________________________   PHYSICAL EXAM:  VITAL SIGNS: ED Triage Vitals  Enc Vitals Group     BP --      Pulse Rate 06/17/18 1949 81     Resp 06/17/18 1949 (!) 21     Temp --      Temp src --      SpO2 06/17/18 1949 100 %     Weight 06/17/18 1943 210 lb (95.3 kg)     Height 06/17/18 1943 5\' 7"  (1.702 m)     Head Circumference --      Peak Flow --      Pain Score --      Pain Loc --      Pain Edu? --      Excl. in [place]? --     Constitutional: Alert but she has a kind of a dazed look in her face most of the time.  She will look educationally and answer questions with yes or no. Eyes: Conjunctivae are normal. PERRL. EOMI. Head: Atraumatic. Nose: No congestion/rhinnorhea. Mouth/Throat: Mucous membranes are moist.  Oropharynx non-erythematous. Neck: No stridor. Cardiovascular: Normal rate, regular rhythm. Grossly normal heart sounds.  Good peripheral circulation. Respiratory: Normal respiratory effort.  No retractions. Lungs CTAB. Gastrointestinal: Soft and nontender. No distention. No abdominal bruits. No CVA tenderness. Musculoskeletal: No lower extremity tenderness nor edema.   Neurologic: Patient is only saying yes or no.  Cranial 2 through 12 appear to be intact with or cannot check visual acuity or visual fields easily patient is moving her arms well and spontaneously moving her feet and legs but she cannot pick her legs up off the bed. Skin:  Skin is warm, dry and intact. No rash noted.   ____________________________________________   LABS (all labs ordered are listed, but only abnormal results are displayed)  Labs Reviewed  COMPREHENSIVE METABOLIC PANEL - Abnormal; Notable for the following components:      Result Value   Sodium 134 (*)    Potassium 7.3 (*)    Chloride 114 (*)     CO2 <7 (*)    Glucose, Bld 171 (*)    BUN 186 (*)    Creatinine, Ser 12.51 (*)    Calcium 8.0 (*)    AST 11 (*)    GFR calc non Af Amer 3 (*)    GFR calc Af Amer 3 (*)    All other components within normal limits  TROPONIN I - Abnormal; Notable for the following components:   Troponin I 0.03 (*)    All other components within normal limits  CBC WITH DIFFERENTIAL/PLATELET - Abnormal; Notable for the following  components:   WBC 15.3 (*)    RBC 3.78 (*)    Hemoglobin 10.5 (*)    HCT 34.0 (*)    Platelets 143 (*)    nRBC 0.5 (*)    Neutro Abs 10.8 (*)    Monocytes Absolute 1.2 (*)    Abs Immature Granulocytes 1.21 (*)    All other components within normal limits  URINALYSIS, COMPLETE (UACMP) WITH MICROSCOPIC - Abnormal; Notable for the following components:   Color, Urine YELLOW (*)    APPearance TURBID (*)    Specific Gravity, Urine 1.002 (*)    Hgb urine dipstick MODERATE (*)    Protein, ur 100 (*)    Leukocytes, UA LARGE (*)    WBC, UA >50 (*)    Bacteria, UA MANY (*)    All other components within normal limits  BASIC METABOLIC PANEL - Abnormal; Notable for the following components:   Potassium 7.0 (*)    Chloride 119 (*)    CO2 <7 (*)    Glucose, Bld 249 (*)    BUN 172 (*)    Creatinine, Ser 11.54 (*)    Calcium 7.1 (*)    GFR calc non Af Amer 3 (*)    GFR calc Af Amer 4 (*)    All other components within normal limits  GLUCOSE, CAPILLARY - Abnormal; Notable for the following components:   Glucose-Capillary 204 (*)    All other components within normal limits  POCT I-STAT, CHEM 8 - Abnormal; Notable for the following components:   Sodium 134 (*)    Potassium 7.6 (*)    Chloride 119 (*)    BUN >140 (*)    Creatinine, Ser 15.60 (*)    Glucose, Bld 164 (*)    Calcium, Ion 1.12 (*)    TCO2 6 (*)    Hemoglobin 11.2 (*)    HCT 33.0 (*)    All other components within normal limits  URINE CULTURE  CULTURE, BLOOD (ROUTINE X 2)  CULTURE, BLOOD (ROUTINE X 2)  BRAIN  NATRIURETIC PEPTIDE  INFLUENZA PANEL BY PCR (TYPE A & B)  TSH  RENAL FUNCTION PANEL  I-STAT CG4 LACTIC ACID, ED  I-STAT CHEM 8, ED  CG4 I-STAT (LACTIC ACID)   ____________________________________________  EKG  KG read interpreted by me shows sinus rhythm rate of 76 left axis apparent left bundle branch no acute ST-T wave changes   second read and interpreted by me EKG shows sinus rhythm 82 left axis no acute ST-T wave changes no marked change from previous 1 ____________________________________________  RADIOLOGY  ED MD interpretation: Chest x-ray read by radiology reviewed by me shows possible trace left pleural effusion nothing else really.  Official radiology report(s): Ct Head Wo Contrast  Result Date: 06/17/2018 CLINICAL DATA:  Altered mental status. EXAM: CT HEAD WITHOUT CONTRAST TECHNIQUE: Contiguous axial images were obtained from the base of the skull through the vertex without intravenous contrast. COMPARISON:  CT head dated January 05, 2005. FINDINGS: Brain: No evidence of acute infarction, hemorrhage, hydrocephalus, extra-axial collection or mass lesion/mass effect. Vascular: No hyperdense vessel or unexpected calcification. Skull: Normal. Negative for fracture or focal lesion. Sinuses/Orbits: No acute finding. Other: None. IMPRESSION: 1. Normal noncontrast head CT. Electronically Signed   By: Titus Dubin M.D.   On: 06/17/2018 20:34   Dg Chest Portable 1 View  Result Date: 06/17/2018 CLINICAL DATA:  Altered mental status and weakness. EXAM: PORTABLE CHEST 1 VIEW COMPARISON:  None. FINDINGS: The heart size and  mediastinal contours are within normal limits. Normal pulmonary vascularity. Possible trace left pleural effusion. No consolidation or pneumothorax. No acute osseous abnormality. IMPRESSION: 1. Possible trace left pleural effusion. Electronically Signed   By: Titus Dubin M.D.   On: 06/17/2018 20:11     ____________________________________________   PROCEDURES  Procedure(s) performed:   Procedures  Critical Care performed: Critical care time 1-1/2 hours.  This includes repeatedly checking on the patient checking her blood pressure watching her fluid status organizing the dialysis speaking with my colleagues about this patient  ____________________________________________   INITIAL IMPRESSION / ASSESSMENT AND PLAN / ED COURSE ----------------------------------------- 9:51 PM on 06/17/2018 -----------------------------------------   Patient's mother comes in later she reports the patient had nausea vomiting and diarrhea for several days that seem to get better.  Patient was not having the symptoms today or yesterday as far as we know.  Patient is now talking slightly more than before saying I feel really bad all over.  Second EKG still does not show any signs of hyperkalemia.  We will give her calcium gluconate insulin and glucose however and get the antibiotics going for what looks like a severe UTI  ----------------------------------------- 11:59 PM on 06/17/2018 -----------------------------------------  At this point patient is had the calcium gluconate the insulin and glucose and bicarbonate.  Dr. Delana Meyer has come in to put in dialysis catheter and Dr. Candiss Norse will get dialysis going here in the emergency room.  Plan is if the patient can keep her blood pressure stable afterwards we can keep her here if not she may have to go elsewhere as we have no ICU beds.  At least if she has to go elsewhere her potassium will be stable and she will be less acidotic.  She has developed some abdominal pain at least it appears that he has abdominal pain she keeps pushing away on her hands if we attempt to reexamine her belly.  We will plan on CT in her abdomen after the dialysis is done.  This will help make sure there is nothing else going on in her belly.       ____________________________________________   FINAL CLINICAL IMPRESSION(S) / ED DIAGNOSES  Final diagnoses:  Acute kidney injury ([place])  Hyperkalemia  Urinary tract infection with hematuria, site unspecified  Altered mental status, unspecified altered mental status type  Also sepsis   ED Discharge Orders    None       Note:  This document was prepared using Dragon voice recognition software and may include unintentional dictation errors.    Nena Polio, MD 06/18/18 [phone]

## 2018-06-17 NOTE — ED Notes (Signed)
Vascular surgery at bedside to place trialysis port for emergent dialysis.

## 2018-06-17 NOTE — ED Notes (Signed)
Patient transported to CT 

## 2018-06-17 NOTE — Consult Note (Signed)
I am called to see the patient by the ER physician Dr. Rip Harbour.  Patient presented with signs and symptoms consistent with sepsis.  She has acute renal failure as well as life-threatening hyperkalemia and therefore needs a central line.  Patient is morbidly obese with a very large pannus and yeast appearing symptoms in the groins and neck skin is clean there are 1 or 2 pimples noted near the clavicular head but otherwise there is no evidence of infection cellulitis or other break in the integrity of the skin of than right neck.  Mental status is quite diminished.  She responds intermittently to verbal cues but does not follow commands and is completely uncooperative.  Assessment acute renal failure secondary to sepsis with complication of life-threatening hyperkalemia  Plan patient will undergo placement of a temporary catheter right neck with ultrasound guidance.

## 2018-06-17 NOTE — ED Notes (Signed)
Critical result of K and troponin given to MD Malinda K-7.3 Troponin-0.03

## 2018-06-17 NOTE — ED Notes (Signed)
Pt tolerated trialysis placement well, mitts applied bilaterally to prevent pt from pulling at IV's.

## 2018-06-17 NOTE — Consult Note (Signed)
Date: 06/17/2018                  Patient Name:  Debra Paul  MRN: [id number]  DOB: 24-Jan-1951  Age / Sex: 67 y.o., female         PCP: Mar Daring, PA-C                 Service Requesting Consult: ER/ Nena Polio, MD                 Reason for Consult: ARF, Hyperkalemia            History of Present Illness: Patient is a 67 y.o. female with medical problems of DM-2, CKD, Kidney stones, who was admitted to [facility] on 06/17/2018 for evaluation of Altered mental status.  Patient is brought in from home by family for decreased responsiveness, generalized weakness.  They report that she has been sick at least since Wednesday and has been getting progressively weaker and staying more in bed.  Oral intake has been poor.  She also reported diarrhea on Friday and Saturday In the ER she was found to have critical labs with potassium of 7.6, BUN greater than 140 and creatinine of 15.6 Baseline creatinine is unknown but last known creatinine from April 2019 is 2.13, GFR 24 Patient is lethargic, not able to provide any meaningful information  Medications: Outpatient medications: (Not in a hospital admission)   Current medications: Current Facility-Administered Medications  Medication Dose Route Frequency Provider Last Rate Last Dose  . LORazepam (ATIVAN) 2 MG/ML injection            Current Outpatient Medications  Medication Sig Dispense Refill  . ferrous sulfate 325 (65 FE) MG tablet Take by mouth. am    . JANUVIA 50 MG tablet TAKE 1 TABLET BY MOUTH  DAILY 90 tablet 1  . lisinopril (PRINIVIL,ZESTRIL) 20 MG tablet TAKE 1 TABLET BY MOUTH  DAILY 90 tablet 1  . metFORMIN (GLUCOPHAGE) 1000 MG tablet Take 1 tablet (1,000 mg total) by mouth 2 (two) times daily. 180 tablet 1  . potassium citrate (UROCIT-K) 10 MEQ (1080 MG) SR tablet Take daily by mouth. am    . pravastatin (PRAVACHOL) 20 MG tablet TAKE 1 TABLET BY MOUTH AT  BEDTIME 90 tablet 1  . ranitidine (ZANTAC) 150 MG  tablet Take 150 mg by mouth 2 (two) times daily.  3  . triamcinolone cream (KENALOG) 0.1 % Apply 1 application topically 2 (two) times daily. (Patient not taking: Reported on 10/12/2016) 30 g 0      Allergies: Allergies  Allergen Reactions  . Ciprofloxacin Rash      Past Medical History: Past Medical History:  Diagnosis Date  . Diabetes mellitus without complication ([place])   . GERD (gastroesophageal reflux disease)   . Hypercholesteremia   . Kidney stones    Takes lisinopril for kidney protection  . Parathyroid disorder ([place])   . Sleep apnea    does not use CPAP  . Vertigo    in past     Past Surgical History: Past Surgical History:  Procedure Laterality Date  . CATARACT EXTRACTION W/PHACO Left 05/14/2017   Procedure: CATARACT EXTRACTION PHACO AND INTRAOCULAR LENS PLACEMENT ([place]) LEFT DIABETIC;  Surgeon: Leandrew Koyanagi, MD;  Location: [facility];  Service: Ophthalmology;  Laterality: Left;  diabetic  . CYSTOSCOPY WITH URETEROSCOPY AND STENT PLACEMENT  09/29/2015   Dr. Bernardo Heater  . ESOPHAGOGASTRODUODENOSCOPY ENDOSCOPY    . LITHOTRIPSY  2002  .  PARATHYROIDECTOMY  1990     Family History: Family History  Problem Relation Age of Onset  . Colon cancer Father   . Heart disease Mother   . Breast cancer Neg Hx      Social History: Social History   Socioeconomic History  . Marital status: Single    Spouse name: Not on file  . Number of children: Not on file  . Years of education: Not on file  . Highest education level: Not on file  Occupational History  . Not on file  Social Needs  . Financial resource strain: Not on file  . Food insecurity:    Worry: Not on file    Inability: Not on file  . Transportation needs:    Medical: Not on file    Non-medical: Not on file  Tobacco Use  . Smoking status: Never Smoker  . Smokeless tobacco: Never Used  Substance and Sexual Activity  . Alcohol use: No    Comment: 2-3times a year  . Drug use: No  .  Sexual activity: Not on file  Lifestyle  . Physical activity:    Days per week: Not on file    Minutes per session: Not on file  . Stress: Not on file  Relationships  . Social connections:    Talks on phone: Not on file    Gets together: Not on file    Attends religious service: Not on file    Active member of club or organization: Not on file    Attends meetings of clubs or organizations: Not on file    Relationship status: Not on file  . Intimate partner violence:    Fear of current or ex partner: Not on file    Emotionally abused: Not on file    Physically abused: Not on file    Forced sexual activity: Not on file  Other Topics Concern  . Not on file  Social History Narrative  . Not on file     Review of Systems: Unobtainable Gen:  HEENT:  CV:  Resp:  GI: GU :  MS:  Derm:   Psych: Heme:  Neuro:  Endocrine  Vital Signs: Blood pressure 115/63, pulse 86, temperature (!) 94.4 F (34.7 C), temperature source Rectal, resp. rate (!) 22, height 5\' 7"  (1.702 m), weight 95.3 kg, SpO2 100 %.   Intake/Output Summary (Last 24 hours) at 06/17/2018 2307 Last data filed at 06/17/2018 2231 Gross per 24 hour  Intake 3200 ml  Output -  Net 3200 ml    Weight trends: [employer]   06/17/18 1943  Weight: 95.3 kg    Physical Exam: General:  Critically ill-appearing, laying in the bed, warming blanket  HEENT  dry oral mucous membranes  Neck:  Supple  Lungs:  Shallow breathing effort, [place] O2  Heart::  Regular rhythm  Abdomen:  Soft, mild diffuse tenderness  Extremities:  No peripheral edema  Neurologic:  Lethargic, able to answer few simple yes/no questions  Skin:  Decreased turgor, few small pustules over right neck    Lab results: Basic Metabolic Panel: Recent Labs  Lab 06/17/18 1950 06/17/18 1957  NA 134* 134*  K 7.3* 7.6*  CL 114* 119*  CO2 <7*  --   GLUCOSE 171* 164*  BUN 186* >140*  CREATININE 12.51* 15.60*  CALCIUM 8.0*  --     Liver Function  Tests: Recent Labs  Lab 06/17/18 1950  AST 11*  ALT 16  ALKPHOS 96  BILITOT 0.6  PROT 7.2  ALBUMIN 3.8   No results for input(s): LIPASE, AMYLASE in the last 168 hours. No results for input(s): AMMONIA in the last 168 hours.  CBC: Recent Labs  Lab 06/17/18 1950 06/17/18 1957  WBC 15.3*  --   NEUTROABS 10.8*  --   HGB 10.5* 11.2*  HCT 34.0* 33.0*  MCV 89.9  --   PLT 143*  --     Cardiac Enzymes: Recent Labs  Lab 06/17/18 1950  TROPONINI 0.03*    BNP: Invalid input(s): POCBNP  CBG: No results for input(s): GLUCAP in the last 168 hours.  Microbiology: No results found for this or any previous visit (from the past 720 hour(s)).   Coagulation Studies: No results for input(s): LABPROT, INR in the last 72 hours.  Urinalysis: Recent Labs    06/17/18 1946  COLORURINE YELLOW*  LABSPEC 1.002*  PHURINE 5.0  GLUCOSEU NEGATIVE  HGBUR MODERATE*  BILIRUBINUR NEGATIVE  KETONESUR NEGATIVE  PROTEINUR 100*  NITRITE NEGATIVE  LEUKOCYTESUR LARGE*        Imaging: Ct Head Wo Contrast  Result Date: 06/17/2018 CLINICAL DATA:  Altered mental status. EXAM: CT HEAD WITHOUT CONTRAST TECHNIQUE: Contiguous axial images were obtained from the base of the skull through the vertex without intravenous contrast. COMPARISON:  CT head dated January 05, 2005. FINDINGS: Brain: No evidence of acute infarction, hemorrhage, hydrocephalus, extra-axial collection or mass lesion/mass effect. Vascular: No hyperdense vessel or unexpected calcification. Skull: Normal. Negative for fracture or focal lesion. Sinuses/Orbits: No acute finding. Other: None. IMPRESSION: 1. Normal noncontrast head CT. Electronically Signed   By: Titus Dubin M.D.   On: 06/17/2018 20:34   Dg Chest Portable 1 View  Result Date: 06/17/2018 CLINICAL DATA:  Altered mental status and weakness. EXAM: PORTABLE CHEST 1 VIEW COMPARISON:  None. FINDINGS: The heart size and mediastinal contours are within normal limits. Normal  pulmonary vascularity. Possible trace left pleural effusion. No consolidation or pneumothorax. No acute osseous abnormality. IMPRESSION: 1. Possible trace left pleural effusion. Electronically Signed   By: Titus Dubin M.D.   On: 06/17/2018 20:11      Assessment & Plan: Pt is a 67 y.o. Caucasian  female with a diabetes, hypertension, kidney stones, chronic kidney disease stage IV, was admitted on 06/17/2018 with acute renal failure, severe acidosis, hyperkalemia, generalized weakness and decreased responsiveness.   1. Acute renal failure on chronic kidney disease stage IV 2. Severe Hyperkalemia 3. Severe Acidosis  Baseline creatinine 2.13/GFR 24 from October 17, 2017 Acute kidney injury is likely secondary to severe dehydration in the setting of taking ACE inhibitors and metformin Patient is severely acidotic with hyperkalemia Urgent hemodialysis is indicated.  Unfortunately no ICU beds are available at this time and patient not stable for transfer. An urgent dialysis catheter was placed by vascular surgery We will try to dialyze patient in the emergency room.  Discussed with ER physician to institute pressors as necessary Patient will undergo a short dialysis treatment to correct Potassium. If an ICU bed becomes available she will be transferred and CRRT initiated.  Case discussed with patient's family, ER physician, dialysis nurse on call, Vascular Surgery      LOS: 0 Rueben Kassim [facility] 12/16/201911:07 PM  [place], [place]  Note: This note was prepared with Dragon dictation. Any transcription errors are unintentional

## 2018-06-17 NOTE — ED Notes (Signed)
I-STAT chem 8 values reported to EDP Malinda: potassium 7.6, BUN >140, Creatinine 15.6, anion gap 18. Per EDP sent repeat test to lab. Samples sent.

## 2018-06-17 NOTE — ED Notes (Signed)
Pharmacy called and will send up IV tylenol asap

## 2018-06-17 NOTE — ED Notes (Signed)
Bear hugger applied to patient at this time.

## 2018-06-17 NOTE — ED Triage Notes (Signed)
Pt comes via [facility] from home with reports of possible flu like symptoms that started Friday. EMS reports pt is AMS per son and unable to get response from pt.  Pt is normally baseline A&OX4. EMS reports BP-98/57, temp-96.9, and O2 at 89% and then placed on 2L and now at 97%.  MD at bedside

## 2018-06-17 NOTE — ED Notes (Signed)
Report given to Ally RN.

## 2018-06-17 NOTE — ED Notes (Signed)
More warm blankets applied to patient at this time. MD informed of temp

## 2018-06-18 ENCOUNTER — Inpatient Hospital Stay: Payer: Medicare Other

## 2018-06-18 ENCOUNTER — Emergency Department: Payer: Medicare Other

## 2018-06-18 DIAGNOSIS — E872 Acidosis: Secondary | ICD-10-CM | POA: Diagnosis present

## 2018-06-18 DIAGNOSIS — N186 End stage renal disease: Secondary | ICD-10-CM | POA: Diagnosis present

## 2018-06-18 DIAGNOSIS — A419 Sepsis, unspecified organism: Secondary | ICD-10-CM | POA: Diagnosis present

## 2018-06-18 DIAGNOSIS — E876 Hypokalemia: Secondary | ICD-10-CM | POA: Diagnosis present

## 2018-06-18 DIAGNOSIS — K5900 Constipation, unspecified: Secondary | ICD-10-CM | POA: Diagnosis present

## 2018-06-18 DIAGNOSIS — Z881 Allergy status to other antibiotic agents status: Secondary | ICD-10-CM | POA: Diagnosis not present

## 2018-06-18 DIAGNOSIS — R6521 Severe sepsis with septic shock: Secondary | ICD-10-CM | POA: Diagnosis present

## 2018-06-18 DIAGNOSIS — Z79899 Other long term (current) drug therapy: Secondary | ICD-10-CM | POA: Diagnosis not present

## 2018-06-18 DIAGNOSIS — N390 Urinary tract infection, site not specified: Secondary | ICD-10-CM | POA: Diagnosis present

## 2018-06-18 DIAGNOSIS — J9601 Acute respiratory failure with hypoxia: Secondary | ICD-10-CM | POA: Diagnosis present

## 2018-06-18 DIAGNOSIS — Z8619 Personal history of other infectious and parasitic diseases: Secondary | ICD-10-CM | POA: Diagnosis present

## 2018-06-18 DIAGNOSIS — N179 Acute kidney failure, unspecified: Secondary | ICD-10-CM | POA: Diagnosis present

## 2018-06-18 DIAGNOSIS — Z7982 Long term (current) use of aspirin: Secondary | ICD-10-CM | POA: Diagnosis not present

## 2018-06-18 DIAGNOSIS — G4733 Obstructive sleep apnea (adult) (pediatric): Secondary | ICD-10-CM | POA: Diagnosis present

## 2018-06-18 DIAGNOSIS — Z888 Allergy status to other drugs, medicaments and biological substances status: Secondary | ICD-10-CM | POA: Diagnosis not present

## 2018-06-18 DIAGNOSIS — I120 Hypertensive chronic kidney disease with stage 5 chronic kidney disease or end stage renal disease: Secondary | ICD-10-CM | POA: Diagnosis present

## 2018-06-18 DIAGNOSIS — K219 Gastro-esophageal reflux disease without esophagitis: Secondary | ICD-10-CM | POA: Diagnosis present

## 2018-06-18 DIAGNOSIS — J9602 Acute respiratory failure with hypercapnia: Secondary | ICD-10-CM | POA: Diagnosis present

## 2018-06-18 DIAGNOSIS — E7800 Pure hypercholesterolemia, unspecified: Secondary | ICD-10-CM | POA: Diagnosis present

## 2018-06-18 DIAGNOSIS — E875 Hyperkalemia: Secondary | ICD-10-CM | POA: Diagnosis present

## 2018-06-18 DIAGNOSIS — N2581 Secondary hyperparathyroidism of renal origin: Secondary | ICD-10-CM | POA: Diagnosis present

## 2018-06-18 DIAGNOSIS — G9341 Metabolic encephalopathy: Secondary | ICD-10-CM | POA: Diagnosis present

## 2018-06-18 DIAGNOSIS — E1122 Type 2 diabetes mellitus with diabetic chronic kidney disease: Secondary | ICD-10-CM | POA: Diagnosis present

## 2018-06-18 LAB — POCT I-STAT, CHEM 8
BUN: 114 mg/dL — ABNORMAL HIGH (ref 8–23)
Calcium, Ion: 1.02 mmol/L — ABNORMAL LOW (ref 1.15–1.40)
Chloride: 114 mmol/L — ABNORMAL HIGH (ref 98–111)
Creatinine, Ser: 7.9 mg/dL — ABNORMAL HIGH (ref 0.44–1.00)
Glucose, Bld: 115 mg/dL — ABNORMAL HIGH (ref 70–99)
HCT: 27 % — ABNORMAL LOW (ref 36.0–46.0)
Hemoglobin: 9.2 g/dL — ABNORMAL LOW (ref 12.0–15.0)
Potassium: 3.6 mmol/L (ref 3.5–5.1)
Sodium: 140 mmol/L (ref 135–145)
TCO2: 15 mmol/L — ABNORMAL LOW (ref 22–32)

## 2018-06-18 LAB — RENAL FUNCTION PANEL
Albumin: 3.1 g/dL — ABNORMAL LOW (ref 3.5–5.0)
BUN: 152 mg/dL — ABNORMAL HIGH (ref 8–23)
CO2: <7 mmol/L — ABNORMAL LOW (ref 22–32)
Calcium: 7.1 mg/dL — ABNORMAL LOW (ref 8.9–10.3)
Chloride: 121 mmol/L — ABNORMAL HIGH (ref 98–111)
Creatinine, Ser: 11.57 mg/dL — ABNORMAL HIGH (ref 0.44–1.00)
GFR calc Af Amer: 4 mL/min — ABNORMAL LOW (ref >60)
GFR calc non Af Amer: 3 mL/min — ABNORMAL LOW (ref >60)
Glucose, Bld: 144 mg/dL — ABNORMAL HIGH (ref 70–99)
Phosphorus: 9.3 mg/dL — ABNORMAL HIGH (ref 2.5–4.6)
Potassium: 6.5 mmol/L (ref 3.5–5.1)
Sodium: 139 mmol/L (ref 135–145)

## 2018-06-18 LAB — BASIC METABOLIC PANEL
BUN: 172 mg/dL — ABNORMAL HIGH (ref 8–23)
CO2: <7 mmol/L — ABNORMAL LOW (ref 22–32)
Calcium: 7.1 mg/dL — ABNORMAL LOW (ref 8.9–10.3)
Chloride: 119 mmol/L — ABNORMAL HIGH (ref 98–111)
Creatinine, Ser: 11.54 mg/dL — ABNORMAL HIGH (ref 0.44–1.00)
GFR calc Af Amer: 4 mL/min — ABNORMAL LOW (ref >60)
GFR calc non Af Amer: 3 mL/min — ABNORMAL LOW (ref >60)
Glucose, Bld: 249 mg/dL — ABNORMAL HIGH (ref 70–99)
Potassium: 7 mmol/L (ref 3.5–5.1)
Sodium: 135 mmol/L (ref 135–145)

## 2018-06-18 LAB — GLUCOSE, CAPILLARY
Glucose-Capillary: 102 mg/dL — ABNORMAL HIGH (ref 70–99)
Glucose-Capillary: 104 mg/dL — ABNORMAL HIGH (ref 70–99)
Glucose-Capillary: 106 mg/dL — ABNORMAL HIGH (ref 70–99)
Glucose-Capillary: 129 mg/dL — ABNORMAL HIGH (ref 70–99)
Glucose-Capillary: 131 mg/dL — ABNORMAL HIGH (ref 70–99)

## 2018-06-18 LAB — BLOOD GAS, VENOUS
Acid-base deficit: 11.5 mmol/L — ABNORMAL HIGH (ref 0.0–2.0)
Bicarbonate: 13.3 mmol/L — ABNORMAL LOW (ref 20.0–28.0)
O2 Saturation: 53.7 %
Patient temperature: 37
pCO2, Ven: 27 mmHg — ABNORMAL LOW (ref 44.0–60.0)
pH, Ven: 7.3 (ref 7.250–7.430)
pO2, Ven: 32 mmHg (ref 32.0–45.0)

## 2018-06-18 LAB — BLOOD GAS, ARTERIAL
Acid-base deficit: 24.6 mmol/L — ABNORMAL HIGH (ref 0.0–2.0)
Bicarbonate: 3.9 mmol/L — ABNORMAL LOW (ref 20.0–28.0)
FIO2: 0.21
O2 Saturation: 97.1 %
Patient temperature: 37
pCO2 arterial: <19 mmHg — CL (ref 32.0–48.0)
pH, Arterial: 7.05 — CL (ref 7.350–7.450)
pO2, Arterial: 128 mmHg — ABNORMAL HIGH (ref 83.0–108.0)

## 2018-06-18 LAB — PHOSPHORUS: Phosphorus: 9.3 mg/dL — ABNORMAL HIGH (ref 2.5–4.6)

## 2018-06-18 LAB — TROPONIN I: Troponin I: 0.03 ng/mL (ref <0.03)

## 2018-06-18 LAB — MRSA PCR SCREENING: MRSA by PCR: NEGATIVE

## 2018-06-18 MED ORDER — ROCURONIUM BROMIDE 50 MG/5ML IV SOLN
100.0000 mg | Freq: Once | INTRAVENOUS | Status: DC
  Filled 2018-06-18: qty 10

## 2018-06-18 MED ORDER — DOCUSATE SODIUM 100 MG PO CAPS: 100.0000 mg | ORAL_CAPSULE | Freq: Two times a day (BID) | ORAL | Status: DC

## 2018-06-18 MED ORDER — FENTANYL 2500MCG IN NS 250ML (10MCG/ML) PREMIX INFUSION
100.0000 ug/h | INTRAVENOUS | Status: DC
  Administered 2018-06-18: 50 ug/h via INTRAVENOUS
  Administered 2018-06-18: 100 ug/h via INTRAVENOUS
  Filled 2018-06-18 (×2): qty 250

## 2018-06-18 MED ORDER — SODIUM CHLORIDE 0.9 % IV SOLN
Freq: Once | INTRAVENOUS | Status: AC
  Administered 2018-06-18: 01:00:00 via INTRAVENOUS

## 2018-06-18 MED ORDER — ONDANSETRON HCL 4 MG/2ML IJ SOLN: 4.0000 mg | Freq: Four times a day (QID) | INTRAMUSCULAR | Status: DC | PRN

## 2018-06-18 MED ORDER — INSULIN ASPART 100 UNIT/ML ~~LOC~~ SOLN
0.0000 [IU] | SUBCUTANEOUS | Status: DC
  Administered 2018-06-18 – 2018-06-19 (×4): 2 [IU] via SUBCUTANEOUS
  Administered 2018-06-20: 5 [IU] via SUBCUTANEOUS
  Administered 2018-06-21 (×2): 3 [IU] via SUBCUTANEOUS
  Administered 2018-06-21: 5 [IU] via SUBCUTANEOUS
  Administered 2018-06-22: 2 [IU] via SUBCUTANEOUS
  Filled 2018-06-18 (×10): qty 1

## 2018-06-18 MED ORDER — HEPARIN SODIUM (PORCINE) 5000 UNIT/ML IJ SOLN
5000.0000 [IU] | Freq: Three times a day (TID) | INTRAMUSCULAR | Status: DC
  Administered 2018-06-18 – 2018-06-21 (×10): 5000 [IU] via SUBCUTANEOUS
  Filled 2018-06-18 (×10): qty 1

## 2018-06-18 MED ORDER — ORAL CARE MOUTH RINSE
15.0000 mL | OROMUCOSAL | Status: DC
  Administered 2018-06-18 – 2018-06-19 (×10): 15 mL via OROMUCOSAL

## 2018-06-18 MED ORDER — KETAMINE HCL 10 MG/ML IJ SOLN
200.0000 mg | Freq: Once | INTRAMUSCULAR | Status: AC
  Administered 2018-06-18: 100 mg via INTRAVENOUS

## 2018-06-18 MED ORDER — PROPOFOL 1000 MG/100ML IV EMUL
5.0000 ug/kg/min | INTRAVENOUS | Status: DC
  Administered 2018-06-18: 20 ug/kg/min via INTRAVENOUS
  Administered 2018-06-18: 10 ug/kg/min via INTRAVENOUS
  Administered 2018-06-19: 25 ug/kg/min via INTRAVENOUS
  Filled 2018-06-18 (×5): qty 100

## 2018-06-18 MED ORDER — SODIUM CHLORIDE 0.9 % IV BOLUS: 1000.0000 mL | Freq: Once | INTRAVENOUS | Status: DC

## 2018-06-18 MED ORDER — FAMOTIDINE 20 MG PO TABS
20.0000 mg | ORAL_TABLET | Freq: Every day | ORAL | Status: DC
  Administered 2018-06-18: 20 mg
  Filled 2018-06-18: qty 1

## 2018-06-18 MED ORDER — NOREPINEPHRINE 4 MG/250ML-% IV SOLN
0.0000 ug/min | INTRAVENOUS | Status: DC
  Filled 2018-06-18: qty 250

## 2018-06-18 MED ORDER — ACETAMINOPHEN 325 MG PO TABS: 650.0000 mg | ORAL_TABLET | Freq: Four times a day (QID) | ORAL | Status: DC | PRN

## 2018-06-18 MED ORDER — CHLORHEXIDINE GLUCONATE 0.12% ORAL RINSE (MEDLINE KIT)
15.0000 mL | Freq: Two times a day (BID) | OROMUCOSAL | Status: DC
  Administered 2018-06-18 – 2018-06-22 (×6): 15 mL via OROMUCOSAL

## 2018-06-18 MED ORDER — ONDANSETRON HCL 4 MG PO TABS: 4.0000 mg | ORAL_TABLET | Freq: Four times a day (QID) | ORAL | Status: DC | PRN

## 2018-06-18 MED ORDER — ACETAMINOPHEN 650 MG RE SUPP: 650.0000 mg | Freq: Four times a day (QID) | RECTAL | Status: DC | PRN

## 2018-06-18 NOTE — Progress Notes (Signed)
Pt tolerated tx well, without complication. Pt did experience a change in rhythm during HD tx,MD aware. Pt began tx in NSR/ST and experienced SB, as well as A-fib during HD. Post HD tx pt returned to NSR. Net UF 08, goal met.    06/18/18 0422  Vital Signs  Temp (!) 96 F (35.6 C)  Temp Source Core  Pulse Rate 87  Pulse Rate Source Monitor  Resp 14  BP 126/81  BP Location Right Arm  BP Method Automatic  Patient Position (if appropriate) Lying  Oxygen Therapy  SpO2 100 %  O2 Device Ventilator  End Tidal CO2 (EtCO2) 17  Dialysis Weight  Weight 95.3 kg  Type of Weight Post-Dialysis  Post-Hemodialysis Assessment  Rinseback Volume (mL) 250 mL  KECN 29.4 V  Dialyzer Clearance Lightly streaked  Duration of HD Treatment -hour(s) 2 hour(s)  Hemodialysis Intake (mL) 500 mL  UF Total -Machine (mL) 508 mL  Net UF (mL) 8 mL  Tolerated HD Treatment Yes  Education / Care Plan  Dialysis Education Provided Yes  Documented Education in Care Plan Yes

## 2018-06-18 NOTE — Progress Notes (Signed)
CRITICAL VALUE ALERT  Critical Value:  Potassium 6.5 and co2 <7  Date & Time Notied:  1000 06/18/18  Provider Notified: MD Kasa  Orders Received/Actions taken: [facility]

## 2018-06-18 NOTE — ED Notes (Signed)
Printed and Made Dr. Owens Shark aware of what looked like a run of A-fib on the monitor.

## 2018-06-18 NOTE — Progress Notes (Signed)
Post HD assessment    06/18/18 0420  Neurological  Level of Consciousness Responds to Pain  Orientation Level Intubated/Tracheostomy - Unable to assess  Respiratory  Respiratory Pattern Regular;Unlabored  Chest Assessment Chest expansion symmetrical  Cardiac  Pulse Irregular  Cardiac Rhythm NSR  Vascular  R Radial Pulse +2  L Radial Pulse +2  Edema Generalized  Integumentary  Integumentary (WDL) X  Skin Color Appropriate for ethnicity  Musculoskeletal  Musculoskeletal (WDL) X  Generalized Weakness Yes  Assistive Device None  GU Assessment  Genitourinary (WDL) X  Genitourinary Symptoms  (HD)  Psychosocial  Psychosocial (WDL) X  Patient Behaviors Not interactive  Emotional support given Given to patient;Given to patient's family

## 2018-06-18 NOTE — ED Notes (Signed)
ED TO INPATIENT HANDOFF REPORT  Name/Age/Gender Debra Paul 67 y.o. female  Code Status    Code Status Orders  (From admission, onward)         Start     Ordered   06/18/18 0824  Full code  Continuous     06/18/18 0823        Code Status History    This patient has a current code status but no historical code status.      Home/SNF/Other Home  Chief Complaint Sepsis  Level of Care/Admitting Diagnosis ED Disposition    ED Disposition Condition [facility] Area: [place] [[id number]]  Level of Care: ICU [6]  Diagnosis: Septic shock [facility]) [[id number]]  Admitting Physician: Harrie Foreman [[id number]]  Attending Physician: Harrie Foreman [[id number]]  Estimated length of stay: past midnight tomorrow  Certification:: I certify this patient will need inpatient services for at least 2 midnights  PT Class (Do Not Modify): Inpatient [101]  PT Acc Code (Do Not Modify): Private [1]       Medical History Past Medical History:  Diagnosis Date  . Diabetes mellitus without complication ([place])   . GERD (gastroesophageal reflux disease)   . Hypercholesteremia   . Kidney stones    Takes lisinopril for kidney protection  . Parathyroid disorder ([place])   . Sleep apnea    does not use CPAP  . Vertigo    in past    Allergies Allergies  Allergen Reactions  . Prilosec [Omeprazole] Other (See Comments)    Reaction: unknown  . Ciprofloxacin Rash    IV Location/Drains/Wounds Patient Lines/Drains/Airways Status   Active Line/Drains/Airways    Name:   Placement date:   Placement time:   Site:   Days:   Peripheral IV 06/17/18 Left Antecubital   06/17/18    1949    Antecubital   1   Peripheral IV 06/17/18 Right Hand   06/17/18    1956    Hand   1   NG/OG Tube Orogastric 81 Fr. Center mouth Aucultation;Xray Documented cm marking at nare/ corner of mouth   06/18/18    Luis M. Cintron mouth   less than 1   Urethral Catheter Beth Tech  Straight-tip 14 Fr.   06/18/18    0152    Straight-tip   less than 1   External Urinary Catheter   06/17/18    2203    -   1   Airway 7.5 mm   06/18/18    0147     less than 1   Incision (Closed) 05/14/17 Eye Left   05/14/17    0901     400          Labs/Imaging Results for orders placed or performed during the hospital encounter of 06/17/18 (from the past 48 hour(s))  Urinalysis, Complete w Microscopic     Status: Abnormal   Collection Time: 06/17/18  7:46 PM  Result Value Ref Range   Color, Urine YELLOW (A) YELLOW   APPearance TURBID (A) CLEAR   Specific Gravity, Urine 1.002 (L) 1.005 - 1.030   pH 5.0 5.0 - 8.0   Glucose, UA NEGATIVE NEGATIVE mg/dL   Hgb urine dipstick MODERATE (A) NEGATIVE   Bilirubin Urine NEGATIVE NEGATIVE   Ketones, ur NEGATIVE NEGATIVE mg/dL   Protein, ur 100 (A) NEGATIVE mg/dL   Nitrite NEGATIVE NEGATIVE   Leukocytes, UA LARGE (A) NEGATIVE   RBC / HPF  21-50 0 - 5 RBC/hpf   WBC, UA >50 (H) 0 - 5 WBC/hpf   Bacteria, UA MANY (A) NONE SEEN   Squamous Epithelial / LPF NONE SEEN 0 - 5   WBC Clumps PRESENT     Comment: Performed at [facility], [address], [place], [place] [address]  Influenza panel by PCR (type A & B)     Status: None   Collection Time: 06/17/18  7:47 PM  Result Value Ref Range   Influenza A By PCR NEGATIVE NEGATIVE   Influenza B By PCR NEGATIVE NEGATIVE    Comment: (NOTE) The Xpert Xpress Flu assay is intended as an aid in the diagnosis of  influenza and should not be used as a sole basis for treatment.  This  assay is FDA approved for nasopharyngeal swab specimens only. Nasal  washings and aspirates are unacceptable for Xpert Xpress Flu testing. Performed at [facility], [address] [place], [place] [address]   Comprehensive metabolic panel     Status: Abnormal   Collection Time: 06/17/18  7:50 PM  Result Value Ref Range   Sodium 134 (L) 135 - 145 mmol/L   Potassium 7.3 (HH) 3.5 - 5.1 mmol/L     Comment: RESULT CONFIRMED BY MANUAL DILUTION CRITICAL RESULT CALLED TO, READ BACK BY AND VERIFIED WITH SAMANTHA HAMILTON AT 2055 ON 06/17/18 BY SNJ    Chloride 114 (H) 98 - 111 mmol/L   CO2 <7 (L) 22 - 32 mmol/L   Glucose, Bld 171 (H) 70 - 99 mg/dL   BUN 186 (H) 8 - 23 mg/dL    Comment: RESULT CONFIRMED BY MANUAL DILUTION SNJ   Creatinine, Ser 12.51 (H) 0.44 - 1.00 mg/dL   Calcium 8.0 (L) 8.9 - 10.3 mg/dL   Total Protein 7.2 6.5 - 8.1 g/dL   Albumin 3.8 3.5 - 5.0 g/dL   AST 11 (L) 15 - 41 U/L   ALT 16 0 - 44 U/L   Alkaline Phosphatase 96 38 - 126 U/L   Total Bilirubin 0.6 0.3 - 1.2 mg/dL   GFR calc non Af Amer 3 (L) >60 mL/min   GFR calc Af Amer 3 (L) >60 mL/min   Anion gap NOT CALCULATED 5 - 15    Comment: Performed at [facility], [address] [place], [place] [address]  Brain natriuretic peptide     Status: None   Collection Time: 06/17/18  7:50 PM  Result Value Ref Range   B Natriuretic Peptide 99.0 0.0 - 100.0 pg/mL    Comment: Performed at [facility], [address] [place], [place] [address]  Troponin I - Once     Status: Abnormal   Collection Time: 06/17/18  7:50 PM  Result Value Ref Range   Troponin I 0.03 (HH) <0.03 ng/mL    Comment: CRITICAL RESULT CALLED TO, READ BACK BY AND VERIFIED WITH SAMANTHA HAMILTON ON 06/17/18 BY SNJ Performed at [facility], [address] [place], [place] [address]   CBC with Differential     Status: Abnormal   Collection Time: 06/17/18  7:50 PM  Result Value Ref Range   WBC 15.3 (H) 4.0 - 10.5 K/uL   RBC 3.78 (L) 3.87 - 5.11 MIL/uL   Hemoglobin 10.5 (L) 12.0 - 15.0 g/dL   HCT 34.0 (L) 36.0 - 46.0 %   MCV 89.9 80.0 - 100.0 fL   MCH 27.8 26.0 - 34.0 pg   MCHC 30.9 30.0 - 36.0 g/dL  RDW 15.5 11.5 - 15.5 %   Platelets 143 (L) 150 - 400 K/uL   nRBC 0.5 (H) 0.0 - 0.2 %   Neutrophils Relative % 69 %   Neutro Abs 10.8 (H) 1.7 - 7.7 K/uL   Lymphocytes Relative 13 %   Lymphs Abs 2.0 0.7 - 4.0 K/uL    Monocytes Relative 8 %   Monocytes Absolute 1.2 (H) 0.1 - 1.0 K/uL   Eosinophils Relative 1 %   Eosinophils Absolute 0.1 0.0 - 0.5 K/uL   Basophils Relative 1 %   Basophils Absolute 0.1 0.0 - 0.1 K/uL   WBC Morphology MILD LEFT SHIFT (1-5% METAS, OCC MYELO, OCC BANDS)    RBC Morphology MORPHOLOGY UNREMARKABLE    Immature Granulocytes 8 %   Abs Immature Granulocytes 1.21 (H) 0.00 - 0.07 K/uL    Comment: Performed at [facility], [address] [place], [place] [address]  Culture, blood (routine x 2)     Status: None (Preliminary result)   Collection Time: 06/17/18  7:50 PM  Result Value Ref Range   Specimen Description BLOOD BLOOD RIGHT HAND    Special Requests      BOTTLES DRAWN AEROBIC AND ANAEROBIC Blood Culture results may not be optimal due to an excessive volume of blood received in culture bottles   Culture      NO GROWTH < 12 HOURS Performed at [facility], [address] [place], [place] [address]    Report Status PENDING   Culture, blood (routine x 2)     Status: None (Preliminary result)   Collection Time: 06/17/18  7:53 PM  Result Value Ref Range   Specimen Description BLOOD LEFT ANTECUBITAL    Special Requests      BOTTLES DRAWN AEROBIC AND ANAEROBIC Blood Culture adequate volume   Culture      NO GROWTH < 12 HOURS Performed at [facility], [address] [place], [place] [address]    Report Status PENDING   CG4 I-STAT (Lactic acid)     Status: None   Collection Time: 06/17/18  7:56 PM  Result Value Ref Range   Lactic Acid, Venous 0.57 0.5 - 1.9 mmol/L  I-STAT, chem 8     Status: Abnormal   Collection Time: 06/17/18  7:57 PM  Result Value Ref Range   Sodium 134 (L) 135 - 145 mmol/L   Potassium 7.6 (HH) 3.5 - 5.1 mmol/L   Chloride 119 (H) 98 - 111 mmol/L   BUN >140 (H) 8 - 23 mg/dL   Creatinine, Ser 15.60 (H) 0.44 - 1.00 mg/dL   Glucose, Bld 164 (H) 70 - 99 mg/dL   Calcium, Ion 1.12 (L) 1.15 - 1.40 mmol/L   TCO2 6 (L) 22  - 32 mmol/L   Hemoglobin 11.2 (L) 12.0 - 15.0 g/dL   HCT 33.0 (L) 36.0 - 46.0 %   Comment NOTIFIED PHYSICIAN   TSH     Status: None   Collection Time: 06/17/18 10:05 PM  Result Value Ref Range   TSH 1.188 0.350 - 4.500 uIU/mL    Comment: Performed by a 3rd Generation assay with a functional sensitivity of <=0.01 uIU/mL. Performed at [facility], [address] [place], [place] [address]   Basic metabolic panel     Status: Abnormal   Collection Time: 06/17/18 10:05 PM  Result Value Ref Range   Sodium 135 135 - 145 mmol/L   Potassium 7.0 (HH) 3.5 - 5.1 mmol/L    Comment:  CRITICAL RESULT CALLED TO, READ BACK BY AND VERIFIED WITH ALAICIA GRANGER @2250  06/17/18 AKT   Chloride 119 (H) 98 - 111 mmol/L   CO2 <7 (L) 22 - 32 mmol/L   Glucose, Bld 249 (H) 70 - 99 mg/dL   BUN 172 (H) 8 - 23 mg/dL    Comment: RESULT CONFIRMED BY MANUAL DILUTION TFK   Creatinine, Ser 11.54 (H) 0.44 - 1.00 mg/dL   Calcium 7.1 (L) 8.9 - 10.3 mg/dL   GFR calc non Af Amer 3 (L) >60 mL/min   GFR calc Af Amer 4 (L) >60 mL/min   Anion gap NOT CALCULATED 5 - 15    Comment: Performed at [facility], [address] [place], [place] [address]  Glucose, capillary     Status: Abnormal   Collection Time: 06/17/18 11:12 PM  Result Value Ref Range   Glucose-Capillary 204 (H) 70 - 99 mg/dL  Blood gas, arterial     Status: Abnormal   Collection Time: 06/18/18  1:11 AM  Result Value Ref Range   FIO2 0.21    pH, Arterial 7.05 (LL) 7.350 - 7.450    Comment: RBV BROWN, MC AT 0120 ON 06/18/2018    pCO2 arterial <19.0 (LL) 32.0 - 48.0 mmHg    Comment: RBV BROWN, MD AT 0121 ON 06/18/2018    pO2, Arterial 128 (H) 83.0 - 108.0 mmHg   Bicarbonate 3.9 (L) 20.0 - 28.0 mmol/L   Acid-base deficit 24.6 (H) 0.0 - 2.0 mmol/L   O2 Saturation 97.1 %   Patient temperature 37.0    Collection site LEFT RADIAL    Sample type ARTERIAL DRAW    Allens test (pass/fail) PASS PASS    Comment: Performed at [facility], [address] [place], [place] [address]  Phosphorus     Status: Abnormal   Collection Time: 06/18/18  2:33 AM  Result Value Ref Range   Phosphorus 9.3 (H) 2.5 - 4.6 mg/dL    Comment: Performed at [facility], [address] [place], [place] [address]  Blood gas, venous     Status: Abnormal   Collection Time: 06/18/18  4:31 AM  Result Value Ref Range   pH, Ven 7.30 7.250 - 7.430   pCO2, Ven 27 (L) 44.0 - 60.0 mmHg   pO2, Ven 32.0 32.0 - 45.0 mmHg   Bicarbonate 13.3 (L) 20.0 - 28.0 mmol/L   Acid-base deficit 11.5 (H) 0.0 - 2.0 mmol/L   O2 Saturation 53.7 %   Patient temperature 37.0    Collection site VEIN    Sample type VENOUS     Comment: Performed at [facility], [address] [place], [place] [address]  I-STAT, [place] 8     Status: Abnormal   Collection Time: 06/18/18  5:05 AM  Result Value Ref Range   Sodium 140 135 - 145 mmol/L   Potassium 3.6 3.5 - 5.1 mmol/L   Chloride 114 (H) 98 - 111 mmol/L   BUN 114 (H) 8 - 23 mg/dL   Creatinine, Ser 7.90 (H) 0.44 - 1.00 mg/dL   Glucose, Bld 115 (H) 70 - 99 mg/dL   Calcium, Ion 1.02 (L) 1.15 - 1.40 mmol/L   TCO2 15 (L) 22 - 32 mmol/L   Hemoglobin 9.2 (L) 12.0 - 15.0 g/dL   HCT 27.0 (L) 36.0 - 46.0 %   Dg Chest 1 View  Result Date: 06/18/2018 CLINICAL DATA:  Central line placement. EXAM: CHEST  1 VIEW COMPARISON:  Radiograph  earlier this day. FINDINGS: Tip of the right central line projects over the proximal SVC. No visualized pneumothorax, however the lung apices are not included in the field of view. Patient is rotated. Asymmetric density of the hemi thoraces may be secondary to rotation. Grossly unchanged heart size from prior. This suspected pleural effusion on prior exam is obscured by patient's hand. IMPRESSION: Tip of the right central line projects over the proximal SVC. No visualized pneumothorax, however both lung apices are not included in the field of view. Rotated exam with asymmetric  density of the hemi thoraces, likely secondary to rotation, however limiting lung assessment. Electronically Signed   By: Keith Rake M.D.   On: 06/18/2018 00:23   Ct Head Wo Contrast  Result Date: 06/17/2018 CLINICAL DATA:  Altered mental status. EXAM: CT HEAD WITHOUT CONTRAST TECHNIQUE: Contiguous axial images were obtained from the base of the skull through the vertex without intravenous contrast. COMPARISON:  CT head dated January 05, 2005. FINDINGS: Brain: No evidence of acute infarction, hemorrhage, hydrocephalus, extra-axial collection or mass lesion/mass effect. Vascular: No hyperdense vessel or unexpected calcification. Skull: Normal. Negative for fracture or focal lesion. Sinuses/Orbits: No acute finding. Other: None. IMPRESSION: 1. Normal noncontrast head CT. Electronically Signed   By: Titus Dubin M.D.   On: 06/17/2018 20:34   Dg Chest Port 1 View  Result Date: 06/18/2018 CLINICAL DATA:  Life support line placement. EXAM: PORTABLE CHEST 1 VIEW COMPARISON:  Chest radiograph June 17, 2018 FINDINGS: Patient rotated LEFT. Endotracheal tube tip projects 5.8 cm above the carina. Nasogastric tube past proximal stomach, distal tip out of field of view. RIGHT internal jugular sheath distal tip projects in proximal superior vena cava. Cardiac silhouette is upper limits of normal size. Mild interstitial prominence. No pleural effusion or focal consolidation. No pneumothorax. Soft tissue planes and included osseous structures are non suspicious. IMPRESSION: 1. Endotracheal tube tip projects 5.8 cm above the carina, nasogastric tube past proximal stomach. Stable appearance of RIGHT IJ sheath. 2. Mild cardiomegaly and interstitial prominence which may be chronic. Electronically Signed   By: Elon Alas M.D.   On: 06/18/2018 02:29   Dg Chest Portable 1 View  Result Date: 06/17/2018 CLINICAL DATA:  Altered mental status and weakness. EXAM: PORTABLE CHEST 1 VIEW COMPARISON:  None. FINDINGS:  The heart size and mediastinal contours are within normal limits. Normal pulmonary vascularity. Possible trace left pleural effusion. No consolidation or pneumothorax. No acute osseous abnormality. IMPRESSION: 1. Possible trace left pleural effusion. Electronically Signed   By: Titus Dubin M.D.   On: 06/17/2018 20:11    Pending Labs Unresulted Labs (From admission, onward)    Start     Ordered   06/18/18 0600  Renal function panel  Once,   STAT     06/17/18 2355   06/18/18 0158  Gastrointestinal Panel by PCR , Stool  (Gastrointestinal Panel by PCR, Stool)  ONCE - STAT,   STAT     06/18/18 0158   06/18/18 0158  C difficile quick scan w PCR reflex  (C Difficile quick screen w PCR reflex panel)  Once, for 24 hours,   STAT     06/18/18 0158   06/18/18 0048  Hepatitis B surface antigen  (New Patient Hemo Labs (Hepatitis B))  Once,   STAT     06/18/18 0047   06/18/18 0048  Hepatitis B surface antibody  (New Patient Hemo Labs (Hepatitis B))  Once,   STAT     06/18/18 0047  06/18/18 0048  Hepatitis B core antibody, total  (New Patient Hemo Labs (Hepatitis B))  Once,   STAT     06/18/18 0047   06/18/18 0048  Hepatitis C antibody  (New Patient Hemo Labs (Hepatitis B))  Once,   STAT     06/18/18 0047   06/17/18 1946  Urine culture  Once,   STAT     06/17/18 1945          Vitals/Pain Today's Vitals   06/18/18 0730 06/18/18 0745 06/18/18 0750 06/18/18 0815  BP: 136/90 118/67 139/70 138/70  Pulse: 90 89 83 84  Resp: (!) 21 19 (!) 22 16  Temp: (!) 96.8 F (36 C) (!) 97 F (36.1 C) (!) 97 F (36.1 C) (!) 97.1 F (36.2 C)  TempSrc:      SpO2: 100% 100% 100% 100%  Weight:      Height:        Isolation Precautions Enteric precautions (UV disinfection)  Medications Medications  Chlorhexidine Gluconate Cloth 2 % PADS 6 each (has no administration in time range)  propofol (DIPRIVAN) 1000 MG/100ML infusion (10 mcg/kg/min  95.3 kg Intravenous New Bag/Given 06/18/18 0141)  fentaNYL  2568mcg in NS 219mL (99mcg/ml) infusion-PREMIX (150 mcg/hr Intravenous Rate/Dose Change 06/18/18 0300)  rocuronium (ZEMURON) injection 100 mg (100 mg Intravenous Not Given 06/18/18 0138)  norepinephrine (LEVOPHED) 4mg  in D5W 243mL premix infusion (0 mcg/min Intravenous Hold 06/18/18 0232)  heparin injection 5,000 Units (has no administration in time range)  acetaminophen (TYLENOL) tablet 650 mg (has no administration in time range)    Or  acetaminophen (TYLENOL) suppository 650 mg (has no administration in time range)  docusate sodium (COLACE) capsule 100 mg (has no administration in time range)  ondansetron (ZOFRAN) tablet 4 mg (has no administration in time range)    Or  ondansetron (ZOFRAN) injection 4 mg (has no administration in time range)  sodium chloride 0.9 % bolus 1,000 mL (0 mLs Intravenous Stopped 06/17/18 2109)  sodium chloride 0.9 % bolus 1,000 mL (0 mLs Intravenous Stopped 06/17/18 2128)  insulin aspart (novoLOG) injection 5 Units (5 Units Intravenous Given 06/17/18 2116)  dextrose 50 % solution 50 mL (50 mLs Intravenous Given 06/17/18 2116)  calcium gluconate 1 g in sodium chloride 0.9 % 100 mL IVPB (0 g Intravenous Stopped 06/17/18 2231)  sodium chloride 0.9 % bolus 1,000 mL (0 mLs Intravenous Stopped 06/17/18 2146)  ceFEPIme (MAXIPIME) 1 g in sodium chloride 0.9 % 100 mL IVPB (0 g Intravenous Stopped 06/17/18 2216)  acetaminophen (OFIRMEV) IV 1,000 mg ( Intravenous Stopped 06/17/18 2258)  sodium chloride 0.9 % bolus 1,000 mL (0 mLs Intravenous Stopped 06/18/18 0000)  sodium bicarbonate injection 50 mEq (50 mEq Intravenous Given 06/17/18 2240)  LORazepam (ATIVAN) 2 MG/ML injection (1 mg  Given 06/17/18 2313)  0.9 %  sodium chloride infusion ( Intravenous New Bag/Given 06/18/18 0046)  ketamine (KETALAR) injection 200 mg (100 mg Intravenous Given 06/18/18 0132)    Mobility non-ambulatory  Report called to [facility] RN on ICU. Pt to transport to RM 8.

## 2018-06-18 NOTE — Consult Note (Signed)
Name: Debra Paul MRN: [id number] DOB: 01-30-1951    ADMISSION DATE:  06/17/2018 CONSULTATION DATE: 06/18/2018  REFERRING MD : Dr. Marcille Blanco   CHIEF COMPLAINT: Weakness and Diarrhea    SIGNIFICANT EVENTS  12/17-Pt admitted to ICU mechanically intubated   STUDIES:  CT Head 12/16>>normal noncontrast head CT  HISTORY OF PRESENT ILLNESS:   This is a 67 yo female with a PMH of Vertigo, OSA (does not use CPAP), Parathyroid Disorder, Kidney Stones, Hypercholesteremia, GERD, CKD (baseline creatinine 2.13/GFR 23 Feb 2018), and Type II Diabetes Mellitus.  She presented to [facility] ER on 12/16 via EMS from home with altered mental status, diarrhea, and questionable flu like symptoms per son onset of symptoms several days prior to arrival.  In the ER mentation remained altered.  Lab results revealed Na+ 134, K+ 7.3, chloride 117, CO2 <7, BUN 186, creatinine 12.51, troponin 0.03, BNP 99, wbc 15.3, hgb 10.5, and platelets 143.  UA positive for UTI, influenza pcr negative, CXR showed mild cardiomegaly and possible trace pleural effusion, and CT head negative.  Nephrology consulted recommended emergent hemodialysis and vascular surgery consulted placed right IJ temporary trialysis catheter.  Pt underwent hemodialysis while in the ER with repeat K+ 3.6.  While in the ER pts altered mental status worsened ABG revealed pH 7.05/pCO2 <19.0/pO2128/acid-base deficit 24.6/bicarb 3.9.  Therefore, pt mechanically intubated.  She was subsequently admitted to ICU by hospitalist team for additional workup and treatment.  PAST MEDICAL HISTORY :   has a past medical history of Diabetes mellitus without complication ([place]), GERD (gastroesophageal reflux disease), Hypercholesteremia, Kidney stones, Parathyroid disorder ([place]), Sleep apnea, and Vertigo.  has a past surgical history that includes Lithotripsy (2002); Parathyroidectomy (1990); Cystoscopy with ureteroscopy and stent placement (09/29/2015);  Esophagogastroduodenoscopy endoscopy; and Cataract extraction w/PHACO (Left, 05/14/2017). Prior to Admission medications   Medication Sig Start Date End Date Taking? Authorizing Provider  amLODipine (NORVASC) 2.5 MG tablet Take 2.5 mg by mouth daily.   Yes [provider]  ferrous sulfate 325 (65 FE) MG tablet Take 325 mg by mouth daily.    Yes [provider]  JANUVIA 50 MG tablet TAKE 1 TABLET BY MOUTH  DAILY 03/11/18  Yes Fenton Malling M, PA-C  lisinopril (PRINIVIL,ZESTRIL) 20 MG tablet TAKE 1 TABLET BY MOUTH  DAILY 03/18/18  Yes Fenton Malling M, PA-C  potassium citrate (UROCIT-K) 10 MEQ (1080 MG) SR tablet Take 10 mEq by mouth daily.  11/25/15  Yes [provider]  pravastatin (PRAVACHOL) 20 MG tablet TAKE 1 TABLET BY MOUTH AT  BEDTIME 02/21/18  Yes Fenton Malling M, PA-C  metFORMIN (GLUCOPHAGE) 1000 MG tablet Take 1 tablet (1,000 mg total) by mouth 2 (two) times daily. 03/18/18   Mar Daring, PA-C  triamcinolone cream (KENALOG) 0.1 % Apply 1 application topically 2 (two) times daily. Patient not taking: Reported on 10/12/2016 09/19/16   Mar Daring, PA-C   Allergies  Allergen Reactions  . Prilosec [Omeprazole] Other (See Comments)    Reaction: unknown  . Ciprofloxacin Rash    FAMILY HISTORY:  family history includes Colon cancer in her father; Heart disease in her mother. SOCIAL HISTORY:  reports that she has never smoked. She has never used smokeless tobacco. She reports that she does not drink alcohol or use drugs.  REVIEW OF SYSTEMS:   Unable to assess pt intubated  SUBJECTIVE:  Unable to assess pt intubated   VITAL SIGNS: Temp:  [93.3 F (34.1 C)-98.1 F (36.7 C)] 98.1 F (36.7 C) (  12/17 0900) Pulse Rate:  [53-101] 91 (12/17 0900) Resp:  [12-32] 16 (12/17 0900) BP: (93-158)/(39-90) 103/59 (12/17 0900) SpO2:  [100 %] 100 % (12/17 0900) FiO2 (%):  [30 %] 30 % (12/17 0149) Weight:  [95.3 kg-104.6 kg] 104.6 kg (12/17  0900)  PHYSICAL EXAMINATION:  GENERAL:critically ill appearing, +resp distress HEAD: Normocephalic, atraumatic.  EYES: Pupils equal, round, reactive to light.  No scleral icterus.  MOUTH: Moist mucosal membrane. NECK: Supple. No thyromegaly. No nodules. No JVD.  PULMONARY: +rhonchi, +wheezing CARDIOVASCULAR: S1 and S2. Regular rate and rhythm. No murmurs, rubs, or gallops.  GASTROINTESTINAL: Soft, nontender, -distended. No masses. Positive bowel sounds. No hepatosplenomegaly.  MUSCULOSKELETAL: No swelling, clubbing, or edema.  NEUROLOGIC: obtunded SKIN:intact,warm,dry    Recent Labs  Lab 06/17/18 1950  06/17/18 2205 06/18/18 0233 06/18/18 0505  NA 134*   < > 135 139 140  K 7.3*   < > 7.0* 6.5* 3.6  CL 114*   < > 119* 121* 114*  CO2 <7*  --  <7* <7*  --   BUN 186*   < > 172* 152* 114*  CREATININE 12.51*   < > 11.54* 11.57* 7.90*  GLUCOSE 171*   < > 249* 144* 115*   < > = values in this interval not displayed.   Recent Labs  Lab 06/17/18 1950 06/17/18 1957 06/18/18 0505  HGB 10.5* 11.2* 9.2*  HCT 34.0* 33.0* 27.0*  WBC 15.3*  --   --   PLT 143*  --   --    Dg Chest 1 View  Result Date: 06/18/2018 CLINICAL DATA:  Central line placement. EXAM: CHEST  1 VIEW COMPARISON:  Radiograph earlier this day. FINDINGS: Tip of the right central line projects over the proximal SVC. No visualized pneumothorax, however the lung apices are not included in the field of view. Patient is rotated. Asymmetric density of the hemi thoraces may be secondary to rotation. Grossly unchanged heart size from prior. This suspected pleural effusion on prior exam is obscured by patient's hand. IMPRESSION: Tip of the right central line projects over the proximal SVC. No visualized pneumothorax, however both lung apices are not included in the field of view. Rotated exam with asymmetric density of the hemi thoraces, likely secondary to rotation, however limiting lung assessment. Electronically Signed   By:  Keith Rake M.D.   On: 06/18/2018 00:23   Ct Head Wo Contrast  Result Date: 06/17/2018 CLINICAL DATA:  Altered mental status. EXAM: CT HEAD WITHOUT CONTRAST TECHNIQUE: Contiguous axial images were obtained from the base of the skull through the vertex without intravenous contrast. COMPARISON:  CT head dated January 05, 2005. FINDINGS: Brain: No evidence of acute infarction, hemorrhage, hydrocephalus, extra-axial collection or mass lesion/mass effect. Vascular: No hyperdense vessel or unexpected calcification. Skull: Normal. Negative for fracture or focal lesion. Sinuses/Orbits: No acute finding. Other: None. IMPRESSION: 1. Normal noncontrast head CT. Electronically Signed   By: Titus Dubin M.D.   On: 06/17/2018 20:34   Dg Chest Port 1 View  Result Date: 06/18/2018 CLINICAL DATA:  Life support line placement. EXAM: PORTABLE CHEST 1 VIEW COMPARISON:  Chest radiograph June 17, 2018 FINDINGS: Patient rotated LEFT. Endotracheal tube tip projects 5.8 cm above the carina. Nasogastric tube past proximal stomach, distal tip out of field of view. RIGHT internal jugular sheath distal tip projects in proximal superior vena cava. Cardiac silhouette is upper limits of normal size. Mild interstitial prominence. No pleural effusion or focal consolidation. No pneumothorax. Soft tissue planes and  included osseous structures are non suspicious. IMPRESSION: 1. Endotracheal tube tip projects 5.8 cm above the carina, nasogastric tube past proximal stomach. Stable appearance of RIGHT IJ sheath. 2. Mild cardiomegaly and interstitial prominence which may be chronic. Electronically Signed   By: Elon Alas M.D.   On: 06/18/2018 02:29   Dg Chest Portable 1 View  Result Date: 06/17/2018 CLINICAL DATA:  Altered mental status and weakness. EXAM: PORTABLE CHEST 1 VIEW COMPARISON:  None. FINDINGS: The heart size and mediastinal contours are within normal limits. Normal pulmonary vascularity. Possible trace left  pleural effusion. No consolidation or pneumothorax. No acute osseous abnormality. IMPRESSION: 1. Possible trace left pleural effusion. Electronically Signed   By: Titus Dubin M.D.   On: 06/17/2018 20:11    ASSESSMENT / PLAN: 67 yo female admitted with sepsis secondary to UTI, acute on chronic renal failure with severe hyperkalemia, and severe metabolic acidosis resulting in acute severe respiratory failure requiring mechanical intubation and emergent hemodialysis   Acute respiratory failure secondary to severe metabolic acidosis Mechanical intubation  Full vent support-vent settings reviewed and established  SBT once all parameters met VAP bundle implemented  Prn bronchodilator therapy    Acute on chronic renal failure with hyperkalemia secondary to dehydration in setting of taking ace inhibitors and metformin  Severe metabolic acidosis  Trend BMP Replace electrolytes as indicated  Monitor UOP Avoid nephrotoxic medications  Nephrology consulted appreciate input-dialysis per recommendations  Continuous telemetry monitoring    Urinary Tract Infection  Diarrhea possibly secondary to gastroenteritis  Trend WBC and monitor fever curve Trend PCT and lactic acid Follow cultures  Will start zosyn   Type II Diabetes Mellitus  CBG's q4hrs SSI  Acute encephalopathy likely secondary to acidosis  Mechanical intubation discomfort/pain  Maintain RASS goal 0 to -1 Propofol gtt and prn fentanyl to maintain RASS goal  WUA daily  If mentation does not improve in the next 48hrs will need repeat CT Head    Critical Care Time devoted to patient care services described in this note is 35 minutes.   Overall, patient is critically ill, prognosis is guarded.  Patient with Multiorgan failure and at high risk for cardiac arrest and death.    Corrin Parker, M.D.  [facility]  Medical Director [place] Director [facility] Cardio-Pulmonary  Department

## 2018-06-18 NOTE — Progress Notes (Signed)
Initial Nutrition Assessment  DOCUMENTATION CODES:   Obesity unspecified  INTERVENTION:  If patient remains intubated, recommend initiating Vital High Protein at 40 mL/hr (960 mL goal daily volume) + Pro-Stat 60 mL BID per OGT. Provides 1360 kcal, 144 grams of protein, 806 mL H2O daily.  If tube feeds are initiated provide liquid MVI daily per tube and a minimum free water flush of 30 mL Q4hrs to maintain tube patency.  NUTRITION DIAGNOSIS:   Inadequate oral intake related to inability to eat as evidenced by NPO status.  GOAL:   Provide needs based on ASPEN/SCCM guidelines  MONITOR:   Vent status, Labs, Weight trends, TF tolerance, I & O's  REASON FOR ASSESSMENT:   Ventilator    ASSESSMENT:   67 year old female with PMHx of DM, hypercholesteremia, GERD, sleep apnea, hx nephrolithiasis admitted with sepsis secondary to UTI, acute on chronic renal failure with severe hyperkalemia, severe metabolic acidosis resulting in acute severe respiratory failure requiring intubation on 12/17.   Patient intubated and sedated. On PRVC mode with FiO2 30% and PEEP 5 cmH2O. Abdomen soft. Patient underwent emergent dialysis overnight. Limited recent weight history in chart. Patient was 100.7-107.3 kg in 2017-2018. Currently 104.6 kg (230.6 lbs).  Enteral Access: OGT placed 12/17; terminates in stomach per chest x-ray 12/17; 75 cm at corner of mouth  MAP: 72-91 mmHg  Patient is currently intubated on ventilator support Ve: 10 L/min Temp (24hrs), Avg:96.7 F (35.9 C), Min:93.3 F (34.1 C), Max:98.4 F (36.9 C)  Propofol: N/A  Medications reviewed and include: famotidine, Novolog 0-15 units Q4hrs, fentanyl gtt. Propofol gtt off since 1100 per chart.  Labs reviewed: CBG 129-131, Chloride 114, BUN 114, Creatinine 7.9, ionized calcium 1.02, Phosphorus 9.3 early this AM.  Patient does not meet criteria for malnutrition.  Discussed with RN.  NUTRITION - FOCUSED PHYSICAL EXAM:    Most  Recent Value  Orbital Region  No depletion  Upper Arm Region  No depletion  Thoracic and Lumbar Region  No depletion  Buccal Region  Unable to assess  Temple Region  No depletion  Clavicle Bone Region  No depletion  Clavicle and Acromion Bone Region  No depletion  Scapular Bone Region  Unable to assess  Dorsal Hand  No depletion  Patellar Region  No depletion  Anterior Thigh Region  No depletion  Posterior Calf Region  No depletion  Edema (RD Assessment)  Mild  Hair  Reviewed  Eyes  Unable to assess  Mouth  Unable to assess  Skin  Reviewed  Nails  Reviewed     Diet Order:   Diet Order            Diet NPO time specified Except for: Sips with Meds  Diet effective now             EDUCATION NEEDS:   No education needs have been identified at this time  Skin:  Skin Assessment: Reviewed RN Assessment  Last BM:  06/18/2018 per chart (no BM characteristics documented)  Height:   Ht Readings from Last 1 Encounters:  06/18/18 5\' 9"  (1.753 m)   Weight:   Wt Readings from Last 1 Encounters:  06/18/18 104.6 kg   Ideal Body Weight:  65.9 kg  BMI:  Body mass index is 34.05 kg/m.  Estimated Nutritional Needs:   Kcal:  3716-9678 (11-14 kcal/kg)  Protein:  132 grams (2 grams/kg IBW)  Fluid:  1.6 L/day (25 mL/kg IBW)  Willey Blade, MS, RD, LDN Office: [phone] Pager:  [phone] After Hours/Weekend Pager: [phone]

## 2018-06-18 NOTE — Progress Notes (Signed)
Debra Paul, [place] 06/18/18  Subjective:   Patient remains critically ill.  She underwent emergent hemodialysis yesterday for severe acidosis as well as hyperkalemia.  Potassium and bicarbonate level have improved.  Patient was intubated yesterday and is currently on ventilator.  FiO2 is 30%.  She is able to open her eyes and family reports that she recognize them Urine output remains poor BUN/creatinine have improved but remain critically elevated  Objective:  Vital signs in last 24 hours:  Temp:  [93.3 F (34.1 C)-98.1 F (36.7 C)] 98.1 F (36.7 C) (12/17 0900) Pulse Rate:  [53-101] 91 (12/17 0900) Resp:  [12-32] 16 (12/17 0900) BP: (93-158)/(39-90) 103/59 (12/17 0900) SpO2:  [100 %] 100 % (12/17 0900) FiO2 (%):  [30 %] 30 % (12/17 0149) Weight:  [95.3 kg-104.6 kg] 104.6 kg (12/17 0900)  Weight change:  Filed Weights   06/18/18 0125 06/18/18 0422 06/18/18 0900  Weight: 95.3 kg 95.3 kg 104.6 kg    Intake/Output:    Intake/Output Summary (Last 24 hours) at 06/18/2018 1138 Last data filed at 06/18/2018 0422 Gross per 24 hour  Intake 3290.81 ml  Output 8 ml  Net 3282.81 ml     Physical Exam: General:  Critically ill-appearing, lying in the bed  HEENT  ET tube in place  Neck  no masses  Pulm/lungs  ventilator assisted, coarse breath sounds  CVS/Heart  regular  Abdomen:   Soft, mild diffuse tenderness  Extremities:  Trace edema  Neurologic:  Sedated but able to open eyes  Skin:  No acute rashes  Access:  Right IJ temporary catheter       Basic Metabolic Panel:  Recent Labs  Lab 06/17/18 1950 06/17/18 1957 06/17/18 2205 06/18/18 0233 06/18/18 0505  NA 134* 134* 135 139 140  K 7.3* 7.6* 7.0* 6.5* 3.6  CL 114* 119* 119* 121* 114*  CO2 <7*  --  <7* <7*  --   GLUCOSE 171* 164* 249* 144* 115*  BUN 186* >140* 172* 152* 114*  CREATININE 12.51* 15.60* 11.54* 11.57* 7.90*  CALCIUM 8.0*  --  7.1* 7.1*  --   PHOS  --   --   --  9.3*   9.3*  --      CBC: Recent Labs  Lab 06/17/18 1950 06/17/18 1957 06/18/18 0505  WBC 15.3*  --   --   NEUTROABS 10.8*  --   --   HGB 10.5* 11.2* 9.2*  HCT 34.0* 33.0* 27.0*  MCV 89.9  --   --   PLT 143*  --   --      No results found for: HEPBSAG, HEPBSAB, HEPBIGM    Microbiology:  Recent Results (from the past 240 hour(s))  Culture, blood (routine x 2)     Status: None (Preliminary result)   Collection Time: 06/17/18  7:50 PM  Result Value Ref Range Status   Specimen Description BLOOD BLOOD RIGHT HAND  Final   Special Requests   Final    BOTTLES DRAWN AEROBIC AND ANAEROBIC Blood Culture results may not be optimal due to an excessive volume of blood received in culture bottles   Culture   Final    NO GROWTH < 12 HOURS Performed at [facility], [address] [place], [place] [address]    Report Status PENDING  Incomplete  Culture, blood (routine x 2)     Status: None (Preliminary result)   Collection Time: 06/17/18  7:53 PM  Result Value Ref Range Status  Specimen Description BLOOD LEFT ANTECUBITAL  Final   Special Requests   Final    BOTTLES DRAWN AEROBIC AND ANAEROBIC Blood Culture adequate volume   Culture   Final    NO GROWTH < 12 HOURS Performed at [facility], [address] [place], [place] [address]    Report Status PENDING  Incomplete    Coagulation Studies: No results for input(s): LABPROT, INR in the last 72 hours.  Urinalysis: Recent Labs    06/17/18 1946  COLORURINE YELLOW*  LABSPEC 1.002*  PHURINE 5.0  GLUCOSEU NEGATIVE  HGBUR MODERATE*  BILIRUBINUR NEGATIVE  KETONESUR NEGATIVE  PROTEINUR 100*  NITRITE NEGATIVE  LEUKOCYTESUR LARGE*      Imaging: Dg Chest 1 View  Result Date: 06/18/2018 CLINICAL DATA:  Central line placement. EXAM: CHEST  1 VIEW COMPARISON:  Radiograph earlier this day. FINDINGS: Tip of the right central line projects over the proximal SVC. No visualized pneumothorax, however the lung  apices are not included in the field of view. Patient is rotated. Asymmetric density of the hemi thoraces may be secondary to rotation. Grossly unchanged heart size from prior. This suspected pleural effusion on prior exam is obscured by patient's hand. IMPRESSION: Tip of the right central line projects over the proximal SVC. No visualized pneumothorax, however both lung apices are not included in the field of view. Rotated exam with asymmetric density of the hemi thoraces, likely secondary to rotation, however limiting lung assessment. Electronically Signed   By: Keith Rake M.D.   On: 06/18/2018 00:23   Ct Head Wo Contrast  Result Date: 06/17/2018 CLINICAL DATA:  Altered mental status. EXAM: CT HEAD WITHOUT CONTRAST TECHNIQUE: Contiguous axial images were obtained from the base of the skull through the vertex without intravenous contrast. COMPARISON:  CT head dated January 05, 2005. FINDINGS: Brain: No evidence of acute infarction, hemorrhage, hydrocephalus, extra-axial collection or mass lesion/mass effect. Vascular: No hyperdense vessel or unexpected calcification. Skull: Normal. Negative for fracture or focal lesion. Sinuses/Orbits: No acute finding. Other: None. IMPRESSION: 1. Normal noncontrast head CT. Electronically Signed   By: Titus Dubin M.D.   On: 06/17/2018 20:34   Dg Chest Port 1 View  Result Date: 06/18/2018 CLINICAL DATA:  Life support line placement. EXAM: PORTABLE CHEST 1 VIEW COMPARISON:  Chest radiograph June 17, 2018 FINDINGS: Patient rotated LEFT. Endotracheal tube tip projects 5.8 cm above the carina. Nasogastric tube past proximal stomach, distal tip out of field of view. RIGHT internal jugular sheath distal tip projects in proximal superior vena cava. Cardiac silhouette is upper limits of normal size. Mild interstitial prominence. No pleural effusion or focal consolidation. No pneumothorax. Soft tissue planes and included osseous structures are non suspicious. IMPRESSION:  1. Endotracheal tube tip projects 5.8 cm above the carina, nasogastric tube past proximal stomach. Stable appearance of RIGHT IJ sheath. 2. Mild cardiomegaly and interstitial prominence which may be chronic. Electronically Signed   By: Elon Alas M.D.   On: 06/18/2018 02:29   Dg Chest Portable 1 View  Result Date: 06/17/2018 CLINICAL DATA:  Altered mental status and weakness. EXAM: PORTABLE CHEST 1 VIEW COMPARISON:  None. FINDINGS: The heart size and mediastinal contours are within normal limits. Normal pulmonary vascularity. Possible trace left pleural effusion. No consolidation or pneumothorax. No acute osseous abnormality. IMPRESSION: 1. Possible trace left pleural effusion. Electronically Signed   By: Titus Dubin M.D.   On: 06/17/2018 20:11     Medications:   . fentaNYL infusion INTRAVENOUS 150 mcg/hr (06/18/18 0300)  .  norepinephrine (LEVOPHED) Adult infusion Stopped (06/18/18 0232)  . propofol (DIPRIVAN) infusion 10 mcg/kg/min (06/18/18 0141)   . chlorhexidine gluconate (MEDLINE KIT)  15 mL Mouth Rinse BID  . Chlorhexidine Gluconate Cloth  6 each Topical Q0600  . famotidine  20 mg Per Tube Daily  . heparin  5,000 Units Subcutaneous Q8H  . insulin aspart  0-15 Units Subcutaneous Q4H  . mouth rinse  15 mL Mouth Rinse 10 times per day  . rocuronium  100 mg Intravenous Once   acetaminophen **OR** acetaminophen, ondansetron **OR** ondansetron (ZOFRAN) IV  Assessment/ Plan:  67 y.o.caucasian female with a diabetes, hypertension, kidney stones, chronic kidney disease stage IV, was admitted on 06/17/2018 with acute renal failure, severe acidosis, hyperkalemia, generalized weakness and decreased responsiveness.   1. Acute renal failure on chronic kidney disease stage IV 2. Severe Hyperkalemia 3. Severe Acidosis 4.  Urinary tract infection 5.  Acute respiratory failure 6.  DM-2 with CKD  Baseline creatinine 2.13/GFR 24 from October 17, 2017 Hyperkalemia and acidosis  improved with hemodialysis.  However, patient probably still uremic.  We will do another hemodialysis treatment today.  Follow urine output and electrolytes closely.  Assess daily for need of further dialysis   LOS: 0 Tavita Eastham Candiss Norse 12/17/201911:38 AM  [place], [place]  Note: This note was prepared with Dragon dictation. Any transcription errors are unintentional

## 2018-06-18 NOTE — Progress Notes (Signed)
HD tx start    06/18/18 0210  Vital Signs  Temp (!) 94.3 F (34.6 C)  Temp Source Core  Pulse Rate 100  Pulse Rate Source Monitor  Resp 17  BP 135/83  BP Location Right Arm  BP Method Automatic  Patient Position (if appropriate) Lying  Oxygen Therapy  SpO2 100 %  O2 Device Ventilator  End Tidal CO2 (EtCO2) 14  During Hemodialysis Assessment  Blood Flow Rate (mL/min) 250 mL/min  Arterial Pressure (mmHg) -80 mmHg  Venous Pressure (mmHg) 90 mmHg  Transmembrane Pressure (mmHg) 30 mmHg  Ultrafiltration Rate (mL/min) 250 mL/min  Dialysate Flow Rate (mL/min) 300 ml/min  Conductivity: Machine  13.5  HD Safety Checks Performed Yes  Dialysis Fluid Bolus Normal Saline  Bolus Amount (mL) 250 mL  Intra-Hemodialysis Comments Tx initiated

## 2018-06-18 NOTE — Progress Notes (Signed)
Chart, H&P, labs and current medications reviewed and agree with the current plan of care, discussed with patient's family

## 2018-06-18 NOTE — Progress Notes (Signed)
HD initiated via R IJ HD catheter without issue. No heparin treatment. 2.5 hour treatment on 3k bath. All labs and orders reviewed. Dr. Candiss Norse at bedside.

## 2018-06-18 NOTE — Progress Notes (Signed)
RT called to patient bedside for ABG. Results revealed pH 7.05, Co2 less than 19, Bicarb 3.9. Per MD order patient was intubated by ED MD with assistance by RT. Patient intubated without incident and placed on settings: 500, 20 RR, 30%, 5 PEEP with a 7.5 tube. Patient SAT at 100% Will continue to monitor.

## 2018-06-18 NOTE — Progress Notes (Signed)
Pre hd 

## 2018-06-18 NOTE — Progress Notes (Signed)
HD tx end    06/18/18 0415  Vital Signs  Temp (!) 96 F (35.6 C)  Temp Source Core  Pulse Rate 89  Pulse Rate Source Monitor  Resp 15  BP 118/81  BP Location Right Arm  BP Method Automatic  Patient Position (if appropriate) Lying  Oxygen Therapy  SpO2 100 %  O2 Device Ventilator  End Tidal CO2 (EtCO2) 18  During Hemodialysis Assessment  Dialysis Fluid Bolus Normal Saline  Bolus Amount (mL) 250 mL  Intra-Hemodialysis Comments Tx completed

## 2018-06-18 NOTE — Progress Notes (Signed)
Treatment completed without issue. No UF removed as ordered. Patient tolerated well. Report given to primary RN. No changes from previous assessment.

## 2018-06-18 NOTE — Progress Notes (Signed)
Pre HD assessment    06/18/18 0115  Neurological  Level of Consciousness Alert  Orientation Level Oriented to person  Respiratory  Chest Assessment Chest expansion symmetrical  Cardiac  ECG Monitor Yes  Cardiac Rhythm NSR;ST  Vascular  R Radial Pulse +2  L Radial Pulse +2  Edema Generalized  Integumentary  Integumentary (WDL) X  Skin Color Appropriate for ethnicity  Musculoskeletal  Musculoskeletal (WDL) X  Generalized Weakness Yes  Assistive Device None  GU Assessment  Genitourinary (WDL) X  Genitourinary Symptoms  (HD acute )  Psychosocial  Psychosocial (WDL) X  Patient Behaviors Not interactive  Needs Expressed Physical;Educational  Psychosocial Additional Assessments Family behavior  Family Behavior Supportive;Cooperative;Appropriate for situation  Emotional support given Given to patient;Given to patient's family

## 2018-06-18 NOTE — Progress Notes (Signed)
Patient wake up assessment completed, patient was alert but would not follow commands. Patient had purposeful movement, with trying to reach for ETT. Patient resedated after 11min of trying to redirect patient and get patient to follow commands. Patient pending Dialysis.

## 2018-06-18 NOTE — ED Provider Notes (Addendum)
I assumed care of the patient from Dr. Cinda Quest at 11:30 PM.  VBG revealed pH of 7.05 CO2 less than 19.  On evaluation patient with non- effective respiration with markedly decreased mentation.  Spoke with the patient's family at length regarding necessity of endotracheal intubation to which they agreed. Procedure Name: Intubation Date/Time: 06/18/2018 5:13 AM Performed by: Gregor Hams, MD Pre-anesthesia Checklist: Patient identified, Emergency Drugs available, Suction available and Patient being monitored Oxygen Delivery Method: Non-rebreather mask Preoxygenation: Pre-oxygenation with 100% oxygen Induction Type: IV induction and Rapid sequence Laryngoscope Size: Mac and 4 Tube size: 7.0 mm Placement Confirmation: ETT inserted through vocal cords under direct vision,  CO2 detector and Breath sounds checked- equal and bilateral Secured at: 23 cm Dental Injury: Teeth and Oropharynx as per pre-operative assessment  Difficulty Due To: Difficulty was anticipated     .Critical Care Performed by: Gregor Hams, MD Authorized by: Gregor Hams, MD   Critical care provider statement:    Critical care time (minutes):  45   Critical care time was exclusive of:  Separately billable procedures and treating other patients and teaching time   Critical care was necessary to treat or prevent imminent or life-threatening deterioration of the following conditions:  Sepsis and respiratory failure   Critical care was time spent personally by me on the following activities:  Development of treatment plan with patient or surrogate, discussions with consultants, evaluation of patient's response to treatment, examination of patient, obtaining history from patient or surrogate, ordering and performing treatments and interventions, ordering and review of laboratory studies, ordering and review of radiographic studies, pulse oximetry, re-evaluation of patient's condition and review of old charts   I  assumed direction of critical care for this patient from another provider in my specialty: yes      .procdoc  Patient underwent hemodialysis in the emergency department repeat potassium at this time 3.6.  Patient's family kept informed of all clinical findings while the patient was in the emergency department.   Gregor Hams, MD 06/18/18 9935    Gregor Hams, MD 06/18/18 [phone]

## 2018-06-18 NOTE — H&P (Signed)
Debra Paul is an 67 y.o. female.   Chief Complaint: Altered mental status HPI: Patient with past medical history of diabetes, chronic kidney disease and sleep apnea presents to the emergency department following a weeklong of general malaise and diarrhea.  The patient was reactive and alert yesterday but was found to be listless and barely communicative by her son this evening.  Laboratory evaluation upon presentation significant for potassium greater than 7 and creatinine greater than 15.  Vascular surgery was consulted and placed a dialysis catheter.  Hemodialysis was initiated in the emergency department.  Her vitals are stable ABG was obtained due to her mental status which showed pH 7.05, PCO2 of less than 19 with base deficit of 24.  She was intubated and placed on mechanical ventilation prior to emergency department staff called the hospitalist service for admission.  Past Medical History:  Diagnosis Date  . Diabetes mellitus without complication ([place])   . GERD (gastroesophageal reflux disease)   . Hypercholesteremia   . Kidney stones    Takes lisinopril for kidney protection  . Parathyroid disorder ([place])   . Sleep apnea    does not use CPAP  . Vertigo    in past    Past Surgical History:  Procedure Laterality Date  . CATARACT EXTRACTION W/PHACO Left 05/14/2017   Procedure: CATARACT EXTRACTION PHACO AND INTRAOCULAR LENS PLACEMENT ([place]) LEFT DIABETIC;  Surgeon: Leandrew Koyanagi, MD;  Location: [facility];  Service: Ophthalmology;  Laterality: Left;  diabetic  . CYSTOSCOPY WITH URETEROSCOPY AND STENT PLACEMENT  09/29/2015   Dr. Bernardo Heater  . ESOPHAGOGASTRODUODENOSCOPY ENDOSCOPY    . LITHOTRIPSY  2002  . PARATHYROIDECTOMY  1990    Family History  Problem Relation Age of Onset  . Colon cancer Father   . Heart disease Mother   . Breast cancer Neg Hx    Social History:  reports that she has never smoked. She has never used smokeless tobacco. She reports that  she does not drink alcohol or use drugs.  Allergies:  Allergies  Allergen Reactions  . Prilosec [Omeprazole] Other (See Comments)    Reaction: unknown  . Ciprofloxacin Rash    (Not in a hospital admission)   Results for orders placed or performed during the hospital encounter of 06/17/18 (from the past 48 hour(s))  Urinalysis, Complete w Microscopic     Status: Abnormal   Collection Time: 06/17/18  7:46 PM  Result Value Ref Range   Color, Urine YELLOW (A) YELLOW   APPearance TURBID (A) CLEAR   Specific Gravity, Urine 1.002 (L) 1.005 - 1.030   pH 5.0 5.0 - 8.0   Glucose, UA NEGATIVE NEGATIVE mg/dL   Hgb urine dipstick MODERATE (A) NEGATIVE   Bilirubin Urine NEGATIVE NEGATIVE   Ketones, ur NEGATIVE NEGATIVE mg/dL   Protein, ur 100 (A) NEGATIVE mg/dL   Nitrite NEGATIVE NEGATIVE   Leukocytes, UA LARGE (A) NEGATIVE   RBC / HPF 21-50 0 - 5 RBC/hpf   WBC, UA >50 (H) 0 - 5 WBC/hpf   Bacteria, UA MANY (A) NONE SEEN   Squamous Epithelial / LPF NONE SEEN 0 - 5   WBC Clumps PRESENT     Comment: Performed at [facility], [address] [place],  [address]  Influenza panel by PCR (type A & B)     Status: None   Collection Time: 06/17/18  7:47 PM  Result Value Ref Range   Influenza A By PCR NEGATIVE NEGATIVE   Influenza B By PCR  NEGATIVE NEGATIVE    Comment: (NOTE) The Xpert Xpress Flu assay is intended as an aid in the diagnosis of  influenza and should not be used as a sole basis for treatment.  This  assay is FDA approved for nasopharyngeal swab specimens only. Nasal  washings and aspirates are unacceptable for Xpert Xpress Flu testing. Performed at [facility], [address] [place], [place] [address]   Comprehensive metabolic panel     Status: Abnormal   Collection Time: 06/17/18  7:50 PM  Result Value Ref Range   Sodium 134 (L) 135 - 145 mmol/L   Potassium 7.3 (HH) 3.5 - 5.1 mmol/L    Comment: RESULT CONFIRMED BY MANUAL DILUTION CRITICAL  RESULT CALLED TO, READ BACK BY AND VERIFIED WITH SAMANTHA HAMILTON AT 2055 ON 06/17/18 BY SNJ    Chloride 114 (H) 98 - 111 mmol/L   CO2 <7 (L) 22 - 32 mmol/L   Glucose, Bld 171 (H) 70 - 99 mg/dL   BUN 186 (H) 8 - 23 mg/dL    Comment: RESULT CONFIRMED BY MANUAL DILUTION SNJ   Creatinine, Ser 12.51 (H) 0.44 - 1.00 mg/dL   Calcium 8.0 (L) 8.9 - 10.3 mg/dL   Total Protein 7.2 6.5 - 8.1 g/dL   Albumin 3.8 3.5 - 5.0 g/dL   AST 11 (L) 15 - 41 U/L   ALT 16 0 - 44 U/L   Alkaline Phosphatase 96 38 - 126 U/L   Total Bilirubin 0.6 0.3 - 1.2 mg/dL   GFR calc non Af Amer 3 (L) >60 mL/min   GFR calc Af Amer 3 (L) >60 mL/min   Anion gap NOT CALCULATED 5 - 15    Comment: Performed at [facility], [address] [place], [place] [address]  Brain natriuretic peptide     Status: None   Collection Time: 06/17/18  7:50 PM  Result Value Ref Range   B Natriuretic Peptide 99.0 0.0 - 100.0 pg/mL    Comment: Performed at [facility], [address] [place], [place] [address]  Troponin I - Once     Status: Abnormal   Collection Time: 06/17/18  7:50 PM  Result Value Ref Range   Troponin I 0.03 (HH) <0.03 ng/mL    Comment: CRITICAL RESULT CALLED TO, READ BACK BY AND VERIFIED WITH SAMANTHA HAMILTON ON 06/17/18 BY SNJ Performed at [facility], [address] [place], [place] [address]   CBC with Differential     Status: Abnormal   Collection Time: 06/17/18  7:50 PM  Result Value Ref Range   WBC 15.3 (H) 4.0 - 10.5 K/uL   RBC 3.78 (L) 3.87 - 5.11 MIL/uL   Hemoglobin 10.5 (L) 12.0 - 15.0 g/dL   HCT 34.0 (L) 36.0 - 46.0 %   MCV 89.9 80.0 - 100.0 fL   MCH 27.8 26.0 - 34.0 pg   MCHC 30.9 30.0 - 36.0 g/dL   RDW 15.5 11.5 - 15.5 %   Platelets 143 (L) 150 - 400 K/uL   nRBC 0.5 (H) 0.0 - 0.2 %   Neutrophils Relative % 69 %   Neutro Abs 10.8 (H) 1.7 - 7.7 K/uL   Lymphocytes Relative 13 %   Lymphs Abs 2.0 0.7 - 4.0 K/uL   Monocytes Relative 8 %   Monocytes Absolute 1.2 (H)  0.1 - 1.0 K/uL   Eosinophils Relative 1 %   Eosinophils Absolute 0.1 0.0 - 0.5 K/uL   Basophils Relative 1 %  Basophils Absolute 0.1 0.0 - 0.1 K/uL   WBC Morphology MILD LEFT SHIFT (1-5% METAS, OCC MYELO, OCC BANDS)    RBC Morphology MORPHOLOGY UNREMARKABLE    Immature Granulocytes 8 %   Abs Immature Granulocytes 1.21 (H) 0.00 - 0.07 K/uL    Comment: Performed at [facility], [address] [place], [place] [address]  Culture, blood (routine x 2)     Status: None (Preliminary result)   Collection Time: 06/17/18  7:50 PM  Result Value Ref Range   Specimen Description BLOOD BLOOD RIGHT HAND    Special Requests      BOTTLES DRAWN AEROBIC AND ANAEROBIC Blood Culture results may not be optimal due to an excessive volume of blood received in culture bottles   Culture      NO GROWTH < 12 HOURS Performed at [facility], [address], [place], [place] [address]    Report Status PENDING   Culture, blood (routine x 2)     Status: None (Preliminary result)   Collection Time: 06/17/18  7:53 PM  Result Value Ref Range   Specimen Description BLOOD LEFT ANTECUBITAL    Special Requests      BOTTLES DRAWN AEROBIC AND ANAEROBIC Blood Culture adequate volume   Culture      NO GROWTH < 12 HOURS Performed at [facility], [address] [place], [place] [address]    Report Status PENDING   CG4 I-STAT (Lactic acid)     Status: None   Collection Time: 06/17/18  7:56 PM  Result Value Ref Range   Lactic Acid, Venous 0.57 0.5 - 1.9 mmol/L  I-STAT, chem 8     Status: Abnormal   Collection Time: 06/17/18  7:57 PM  Result Value Ref Range   Sodium 134 (L) 135 - 145 mmol/L   Potassium 7.6 (HH) 3.5 - 5.1 mmol/L   Chloride 119 (H) 98 - 111 mmol/L   BUN >140 (H) 8 - 23 mg/dL   Creatinine, Ser 15.60 (H) 0.44 - 1.00 mg/dL   Glucose, Bld 164 (H) 70 - 99 mg/dL   Calcium, Ion 1.12 (L) 1.15 - 1.40 mmol/L   TCO2 6 (L) 22 - 32 mmol/L   Hemoglobin 11.2 (L) 12.0 - 15.0 g/dL    HCT 33.0 (L) 36.0 - 46.0 %   Comment NOTIFIED PHYSICIAN   TSH     Status: None   Collection Time: 06/17/18 10:05 PM  Result Value Ref Range   TSH 1.188 0.350 - 4.500 uIU/mL    Comment: Performed by a 3rd Generation assay with a functional sensitivity of <=0.01 uIU/mL. Performed at [facility], [address] [place], [place] [address]   Basic metabolic panel     Status: Abnormal   Collection Time: 06/17/18 10:05 PM  Result Value Ref Range   Sodium 135 135 - 145 mmol/L   Potassium 7.0 (HH) 3.5 - 5.1 mmol/L    Comment: CRITICAL RESULT CALLED TO, READ BACK BY AND VERIFIED WITH ALAICIA GRANGER @2250  06/17/18 AKT   Chloride 119 (H) 98 - 111 mmol/L   CO2 <7 (L) 22 - 32 mmol/L   Glucose, Bld 249 (H) 70 - 99 mg/dL   BUN 172 (H) 8 - 23 mg/dL    Comment: RESULT CONFIRMED BY MANUAL DILUTION TFK   Creatinine, Ser 11.54 (H) 0.44 - 1.00 mg/dL   Calcium 7.1 (L) 8.9 - 10.3 mg/dL   GFR calc non Af Amer 3 (L) >60 mL/min   GFR calc  Af Amer 4 (L) >60 mL/min   Anion gap NOT CALCULATED 5 - 15    Comment: Performed at [facility], [address] [place], [place] [address]  Glucose, capillary     Status: Abnormal   Collection Time: 06/17/18 11:12 PM  Result Value Ref Range   Glucose-Capillary 204 (H) 70 - 99 mg/dL  Blood gas, arterial     Status: Abnormal   Collection Time: 06/18/18  1:11 AM  Result Value Ref Range   FIO2 0.21    pH, Arterial 7.05 (LL) 7.350 - 7.450    Comment: RBV BROWN, MC AT 0120 ON 06/18/2018    pCO2 arterial <19.0 (LL) 32.0 - 48.0 mmHg    Comment: RBV BROWN, MD AT 0121 ON 06/18/2018    pO2, Arterial 128 (H) 83.0 - 108.0 mmHg   Bicarbonate 3.9 (L) 20.0 - 28.0 mmol/L   Acid-base deficit 24.6 (H) 0.0 - 2.0 mmol/L   O2 Saturation 97.1 %   Patient temperature 37.0    Collection site LEFT RADIAL    Sample type ARTERIAL DRAW    Allens test (pass/fail) PASS PASS    Comment: Performed at [facility], [address] [place], [place] [address]   Phosphorus     Status: Abnormal   Collection Time: 06/18/18  2:33 AM  Result Value Ref Range   Phosphorus 9.3 (H) 2.5 - 4.6 mg/dL    Comment: Performed at [facility], [address] [place], [place] [address]  Blood gas, venous     Status: Abnormal   Collection Time: 06/18/18  4:31 AM  Result Value Ref Range   pH, Ven 7.30 7.250 - 7.430   pCO2, Ven 27 (L) 44.0 - 60.0 mmHg   pO2, Ven 32.0 32.0 - 45.0 mmHg   Bicarbonate 13.3 (L) 20.0 - 28.0 mmol/L   Acid-base deficit 11.5 (H) 0.0 - 2.0 mmol/L   O2 Saturation 53.7 %   Patient temperature 37.0    Collection site VEIN    Sample type VENOUS     Comment: Performed at [facility], [address] [place], [place] [address]  I-STAT, [place] 8     Status: Abnormal   Collection Time: 06/18/18  5:05 AM  Result Value Ref Range   Sodium 140 135 - 145 mmol/L   Potassium 3.6 3.5 - 5.1 mmol/L   Chloride 114 (H) 98 - 111 mmol/L   BUN 114 (H) 8 - 23 mg/dL   Creatinine, Ser 7.90 (H) 0.44 - 1.00 mg/dL   Glucose, Bld 115 (H) 70 - 99 mg/dL   Calcium, Ion 1.02 (L) 1.15 - 1.40 mmol/L   TCO2 15 (L) 22 - 32 mmol/L   Hemoglobin 9.2 (L) 12.0 - 15.0 g/dL   HCT 27.0 (L) 36.0 - 46.0 %   Dg Chest 1 View  Result Date: 06/18/2018 CLINICAL DATA:  Central line placement. EXAM: CHEST  1 VIEW COMPARISON:  Radiograph earlier this day. FINDINGS: Tip of the right central line projects over the proximal SVC. No visualized pneumothorax, however the lung apices are not included in the field of view. Patient is rotated. Asymmetric density of the hemi thoraces may be secondary to rotation. Grossly unchanged heart size from prior. This suspected pleural effusion on prior exam is obscured by patient's hand. IMPRESSION: Tip of the right central line projects over the proximal SVC. No visualized pneumothorax, however both lung apices are not included in the field of view. Rotated exam with asymmetric density of the  hemi thoraces, likely secondary to rotation,  however limiting lung assessment. Electronically Signed   By: Keith Rake M.D.   On: 06/18/2018 00:23   Ct Head Wo Contrast  Result Date: 06/17/2018 CLINICAL DATA:  Altered mental status. EXAM: CT HEAD WITHOUT CONTRAST TECHNIQUE: Contiguous axial images were obtained from the base of the skull through the vertex without intravenous contrast. COMPARISON:  CT head dated January 05, 2005. FINDINGS: Brain: No evidence of acute infarction, hemorrhage, hydrocephalus, extra-axial collection or mass lesion/mass effect. Vascular: No hyperdense vessel or unexpected calcification. Skull: Normal. Negative for fracture or focal lesion. Sinuses/Orbits: No acute finding. Other: None. IMPRESSION: 1. Normal noncontrast head CT. Electronically Signed   By: Titus Dubin M.D.   On: 06/17/2018 20:34   Dg Chest Port 1 View  Result Date: 06/18/2018 CLINICAL DATA:  Life support line placement. EXAM: PORTABLE CHEST 1 VIEW COMPARISON:  Chest radiograph June 17, 2018 FINDINGS: Patient rotated LEFT. Endotracheal tube tip projects 5.8 cm above the carina. Nasogastric tube past proximal stomach, distal tip out of field of view. RIGHT internal jugular sheath distal tip projects in proximal superior vena cava. Cardiac silhouette is upper limits of normal size. Mild interstitial prominence. No pleural effusion or focal consolidation. No pneumothorax. Soft tissue planes and included osseous structures are non suspicious. IMPRESSION: 1. Endotracheal tube tip projects 5.8 cm above the carina, nasogastric tube past proximal stomach. Stable appearance of RIGHT IJ sheath. 2. Mild cardiomegaly and interstitial prominence which may be chronic. Electronically Signed   By: Elon Alas M.D.   On: 06/18/2018 02:29   Dg Chest Portable 1 View  Result Date: 06/17/2018 CLINICAL DATA:  Altered mental status and weakness. EXAM: PORTABLE CHEST 1 VIEW COMPARISON:  None. FINDINGS: The heart size and mediastinal contours are within normal  limits. Normal pulmonary vascularity. Possible trace left pleural effusion. No consolidation or pneumothorax. No acute osseous abnormality. IMPRESSION: 1. Possible trace left pleural effusion. Electronically Signed   By: Titus Dubin M.D.   On: 06/17/2018 20:11    Review of Systems  Unable to perform ROS: Intubated    Blood pressure 126/74, pulse 83, temperature (!) 95.9 F (35.5 C), resp. rate 13, height 5\' 7"  (1.702 m), weight 95.3 kg, SpO2 100 %. Physical Exam  Vitals reviewed. Constitutional: She appears well-developed and well-nourished. She appears listless. No distress. She is intubated.  HENT:  Head: Normocephalic and atraumatic.  Mouth/Throat: Oropharynx is clear and moist.  Eyes: Pupils are equal, round, and reactive to light. Conjunctivae and EOM are normal. No scleral icterus.  Neck: Normal range of motion. Neck supple. No JVD present. No tracheal deviation present. No thyromegaly present.  Cardiovascular: Normal rate, regular rhythm and normal heart sounds. Exam reveals no gallop and no friction rub.  No murmur heard. Respiratory: Effort normal and breath sounds normal. She is intubated.  GI: Soft. Bowel sounds are normal. She exhibits no distension. There is no abdominal tenderness.  Genitourinary:    Genitourinary Comments: Deferred   Musculoskeletal:        General: Edema present.     Comments: Pharmacologically paralyzed as well as sedated and intubated  Lymphadenopathy:    She has no cervical adenopathy.  Neurological: She appears listless. No cranial nerve deficit. She exhibits normal muscle tone.  Skin: Skin is warm and dry. No rash noted. No erythema.  Psychiatric:  Sedated and intubated     Assessment/Plan This is a 67 year old female admitted for septic shock. 1.  Septic shock: Urinary tract infection  present on admission.  Possibly secondary to diarrheal illness.  Unclear if viral or bacterial origin.  Patient is not consistently hemodynamically stable.   Levophed at bedside for intermittent use.  Follow blood and urine cultures for growth and sensitivities.  Initial metabolic acidosis resolved though some are low/medium that Sherilyn Cooter is noted. 2.  ESRD: Dialysis initiated emergently due to hyperkalemia.  Also shifted with insulin prior to dialysis. Nephrology to follow 3.  Hyperkalemia: No arrhythmias.  Corrected dialysis. 4.  Uremia: Contribute to encephalopathy on admission.   5.  DVT prophylaxis: Heparin 6.  GI prophylaxis: PPI 24 hours intubation The patient is a full code.  I have personally spent 45 minutes in critical care time this patient  Harrie Foreman, MD 06/18/2018, 6:08 AM

## 2018-06-18 NOTE — Progress Notes (Signed)
Pre HD assessment    06/18/18 0125  Vital Signs  Temp (!) 93.3 F (34.1 C)  Temp Source Rectal  Pulse Rate 82  Pulse Rate Source Monitor  Resp 14  BP (!) 122/53  BP Location Right Arm  BP Method Automatic  Patient Position (if appropriate) Lying  Oxygen Therapy  SpO2 100 %  O2 Device Room Air  PAINAD (Pain Assessment in Advanced Dementia)  Breathing 2  Negative Vocalization 1  Facial Expression 0  Body Language 1  Consolability 1  PAINAD Score 5  Dialysis Weight  Weight 95.3 kg  Type of Weight Pre-Dialysis  Time-Out for Hemodialysis  What Procedure? HD  Pt Identifiers(min of two) First/Last Name;MRN/Account#  Correct Site? Yes  Correct Side? Yes  Correct Procedure? Yes  Consents Verified? Yes  Rad Studies Available? N/A  Safety Precautions Reviewed? Yes  Engineer, civil (consulting) Number  (3A)  Station Number  (bedside ED 17, emergent HD )  UF/Alarm Test Passed  Conductivity: Meter 13.6  Conductivity: Machine  13.5  pH 7.6  Reverse Osmosis WRO #3  Normal Saline Lot Number [id number]  Dialyzer Lot Number [id number]  Disposable Set Lot Number [id number]  Machine Temperature 98.6 F (37 C)  Musician and Audible Yes  Blood Lines Intact and Secured Yes  Pre Treatment Patient Checks  Vascular access used during treatment Catheter  Hepatitis B Surface Antigen Results  (unk)  Isolation Initiated Yes  Hepatitis B Surface Antibody  (unk)  Date Hepatitis B Surface Antibody Drawn 06/18/18  Hemodialysis Consent Verified Yes  Hemodialysis Standing Orders Initiated Yes  ECG (Telemetry) Monitor On Yes  Prime Ordered Normal Saline  Length of  DialysisTreatment -hour(s) 2 Hour(s)  Dialyzer [place] 17H NR  Dialysate 1K  Dialysis Anticoagulant None  Dialysate Flow Ordered 300  Blood Flow Rate Ordered 250 mL/min  Ultrafiltration Goal 0 Liters  Pre Treatment Labs Renal panel;Hepatitis B Surface Antigen;Phosphorus (HBSAB, HBcore, HCSAB)  Dialysis Blood Pressure Support  Ordered Normal Saline  Education / Care Plan  Dialysis Education Provided Yes  Documented Education in Care Plan Yes

## 2018-06-19 LAB — GLUCOSE, CAPILLARY
Glucose-Capillary: 107 mg/dL — ABNORMAL HIGH (ref 70–99)
Glucose-Capillary: 126 mg/dL — ABNORMAL HIGH (ref 70–99)
Glucose-Capillary: 136 mg/dL — ABNORMAL HIGH (ref 70–99)
Glucose-Capillary: 106 mg/dL — ABNORMAL HIGH (ref 70–99)
Glucose-Capillary: 113 mg/dL — ABNORMAL HIGH (ref 70–99)

## 2018-06-19 LAB — MAGNESIUM: Magnesium: 1.6 mg/dL — ABNORMAL LOW (ref 1.7–2.4)

## 2018-06-19 LAB — HEPATITIS C ANTIBODY: HCV Ab: <0.1 s/co ratio (ref 0.0–0.9)

## 2018-06-19 LAB — BASIC METABOLIC PANEL
Anion gap: 10 (ref 5–15)
BUN: 74 mg/dL — ABNORMAL HIGH (ref 8–23)
CO2: 21 mmol/L — ABNORMAL LOW (ref 22–32)
Calcium: 6.8 mg/dL — ABNORMAL LOW (ref 8.9–10.3)
Chloride: 108 mmol/L (ref 98–111)
Creatinine, Ser: 5.72 mg/dL — ABNORMAL HIGH (ref 0.44–1.00)
GFR calc Af Amer: 8 mL/min — ABNORMAL LOW (ref >60)
GFR calc non Af Amer: 7 mL/min — ABNORMAL LOW (ref >60)
Glucose, Bld: 130 mg/dL — ABNORMAL HIGH (ref 70–99)
Potassium: 3.3 mmol/L — ABNORMAL LOW (ref 3.5–5.1)
Sodium: 139 mmol/L (ref 135–145)

## 2018-06-19 LAB — HEPATITIS B SURFACE ANTIBODY,QUALITATIVE: Hep B S Ab: NONREACTIVE

## 2018-06-19 LAB — PHOSPHORUS: Phosphorus: 6.1 mg/dL — ABNORMAL HIGH (ref 2.5–4.6)

## 2018-06-19 LAB — URINE CULTURE

## 2018-06-19 LAB — HEPATITIS B CORE ANTIBODY, TOTAL: Hep B Core Total Ab: NEGATIVE

## 2018-06-19 LAB — HEPATITIS B SURFACE ANTIGEN: Hepatitis B Surface Ag: NEGATIVE

## 2018-06-19 MED ORDER — SODIUM CHLORIDE 0.9 % IV SOLN
1.0000 g | Freq: Every day | INTRAVENOUS | Status: DC
  Administered 2018-06-20 – 2018-06-21 (×2): 1 g via INTRAVENOUS
  Filled 2018-06-19: qty 1
  Filled 2018-06-19 (×3): qty 10

## 2018-06-19 MED ORDER — FAMOTIDINE 20 MG PO TABS: 20.0000 mg | ORAL_TABLET | Freq: Every day | ORAL | Status: DC

## 2018-06-19 MED ORDER — SODIUM CHLORIDE 0.9 % IV SOLN
1.0000 g | Freq: Every day | INTRAVENOUS | Status: DC
  Administered 2018-06-19: 1 g via INTRAVENOUS
  Filled 2018-06-19: qty 10
  Filled 2018-06-19: qty 1

## 2018-06-19 NOTE — Progress Notes (Signed)
Post HD assessment. PT tolerated tx well without c/o or complication. Net UF 33m, goal met.    06/19/18 1806  Vital Signs  Temp 98.5 F (36.9 C)  Temp Source Oral  Pulse Rate 66  Pulse Rate Source Monitor  Resp 12  BP 133/72  BP Location Right Arm  BP Method Automatic  Patient Position (if appropriate) Lying  Oxygen Therapy  SpO2 99 %  O2 Device Nasal Cannula  O2 Flow Rate (L/min) 1 L/min  Dialysis Weight  Weight 107.2 kg  Type of Weight Post-Dialysis  Post-Hemodialysis Assessment  Rinseback Volume (mL) 250 mL  KECN 52.9 V  Dialyzer Clearance Lightly streaked  Duration of HD Treatment -hour(s) 3 hour(s)  Hemodialysis Intake (mL) 500 mL  UF Total -Machine (mL) 511 mL  Net UF (mL) 11 mL  Tolerated HD Treatment Yes  Education / Care Plan  Dialysis Education Provided Yes  Documented Education in Care Plan Yes  Hemodialysis Catheter Right Internal jugular  Placement Date/Time: 06/18/18 0000   Placed prior to admission: No  Ultrasound Used?: Yes  Person Inserting Catheter: Schnier MD  Orientation: Right  Access Location: Internal jugular  Site Condition No complications  Blue Lumen Status Heparin locked  Red Lumen Status Heparin locked  Purple Lumen Status N/A  Catheter fill solution Heparin 1000 units/ml  Catheter fill volume (Arterial) 1.4 cc  Catheter fill volume (Venous) 1.4  Dressing Status Clean;Dry;Intact  Drainage Description None  Post treatment catheter status Capped and Clamped

## 2018-06-19 NOTE — Progress Notes (Signed)
Report given to [place], RN for patient to be transferred to room 226, patients mother at beside, dialysis nurse at bedside, and patient calm and cooperative.

## 2018-06-19 NOTE — Progress Notes (Signed)
Post HD assessment    06/19/18 1802  Neurological  Level of Consciousness Responds to Voice  Orientation Level Oriented to person;Oriented to place;Disoriented to time;Disoriented to situation  Respiratory  Respiratory Pattern Regular;Unlabored  Chest Assessment Chest expansion symmetrical  Cardiac  Pulse Regular  Cardiac Rhythm NSR  Vascular  R Radial Pulse +2  L Radial Pulse +2  Edema Generalized  Integumentary  Integumentary (WDL) X  Skin Color Appropriate for ethnicity  Musculoskeletal  Musculoskeletal (WDL) X  Generalized Weakness Yes  Assistive Device None  GU Assessment  Genitourinary (WDL) X  Genitourinary Symptoms  (HD )  Psychosocial  Psychosocial (WDL) WDL  Patient Behaviors Appropriate for situation;Cooperative;Calm  Emotional support given Given to patient;Given to patient's family

## 2018-06-19 NOTE — Progress Notes (Signed)
Pre HD assessment    06/19/18 1428  Neurological  Level of Consciousness Alert  Orientation Level Oriented to person;Oriented to place;Disoriented to time;Disoriented to situation  Respiratory  Respiratory Pattern Regular;Unlabored  Chest Assessment Chest expansion symmetrical  Cardiac  Pulse Regular  Cardiac Rhythm NSR  Vascular  R Radial Pulse +2  L Radial Pulse +2  Edema Generalized  Integumentary  Integumentary (WDL) X  Skin Color Appropriate for ethnicity  Musculoskeletal  Musculoskeletal (WDL) X  Generalized Weakness Yes  Assistive Device None  GU Assessment  Genitourinary (WDL) X  Genitourinary Symptoms  (HD)  Psychosocial  Psychosocial (WDL) WDL  Patient Behaviors Cooperative;Calm;Appropriate for situation  Emotional support given Given to patient

## 2018-06-19 NOTE — Progress Notes (Signed)
HD tx start    06/19/18 1448  Vital Signs  Pulse Rate 76  Pulse Rate Source Monitor  Resp 12  BP 107/71  BP Location Right Arm  BP Method Automatic  Patient Position (if appropriate) Lying  Oxygen Therapy  SpO2 96 %  O2 Device Room Air  During Hemodialysis Assessment  Blood Flow Rate (mL/min) 300 mL/min  Arterial Pressure (mmHg) -60 mmHg  Venous Pressure (mmHg) 50 mmHg  Transmembrane Pressure (mmHg) 60 mmHg  Ultrafiltration Rate (mL/min) 170 mL/min  Dialysate Flow Rate (mL/min) 600 ml/min  Conductivity: Machine  14.1  HD Safety Checks Performed Yes  Dialysis Fluid Bolus Normal Saline  Bolus Amount (mL) 250 mL  Intra-Hemodialysis Comments Tx initiated  Hemodialysis Catheter Right Internal jugular  Placement Date/Time: 06/18/18 0000   Placed prior to admission: No  Ultrasound Used?: Yes  Person Inserting Catheter: Schnier MD  Orientation: Right  Access Location: Internal jugular  Blue Lumen Status Infusing  Red Lumen Status Infusing

## 2018-06-19 NOTE — Progress Notes (Signed)
[facility] NAME: Melodye Swor    MR#:  [id number]  DATE OF BIRTH:  February 23, 1951  SUBJECTIVE:  CHIEF COMPLAINT: Patient is extubated and following verbal commands getting bedside hemodialysis, low urine output  REVIEW OF SYSTEMS:  CONSTITUTIONAL: No fever, reporting weakness.  EYES: No blurred or double vision.  EARS, NOSE, AND THROAT: No tinnitus or ear pain.  RESPIRATORY: No cough, shortness of breath CARDIOVASCULAR: No chest pain, orthopnea, edema.  GASTROINTESTINAL: No nausea, vomiting, diarrhea or abdominal pain.  SKIN: No rash or lesion. MUSCULOSKELETAL: No joint pain or arthritis.   NEUROLOGIC: No tingling, numbness, weakness.  PSYCHIATRY: No anxiety or depression.   DRUG ALLERGIES:   Allergies  Allergen Reactions  . Prilosec [Omeprazole] Other (See Comments)    Reaction: unknown  . Ciprofloxacin Rash    VITALS:  Blood pressure 126/74, pulse 69, temperature 98.1 F (36.7 C), temperature source Oral, resp. rate 13, height 5\' 9"  (1.753 m), weight 107.5 kg, SpO2 96 %.  PHYSICAL EXAMINATION:  GENERAL:  67 y.o.-year-old patient lying in the bed with no acute distress.  EYES: Pupils equal, round, reactive to light and accommodation. No scleral icterus. Extraocular muscles intact.  HEENT: Head atraumatic, normocephalic. Oropharynx and nasopharynx clear.  NECK:  Supple, no jugular venous distention. No thyroid enlargement, no tenderness.  LUNGS: Normal breath sounds bilaterally, no wheezing, rales,rhonchi or crepitation. No use of accessory muscles of respiration.  CARDIOVASCULAR: S1, S2 normal. No murmurs, rubs, or gallops.  ABDOMEN: Soft, nontender, nondistended. Bowel sounds present.   EXTREMITIES: No pedal edema, cyanosis, or clubbing.  NEUROLOGIC: Awake, alert and oriented x3 sensation intact. Gait not checked.  PSYCHIATRIC: The patient is alert and oriented x 3.  SKIN: No obvious rash, lesion, or ulcer.     LABORATORY PANEL:   CBC Recent Labs  Lab 06/17/18 1950  06/18/18 0505  WBC 15.3*  --   --   HGB 10.5*   < > 9.2*  HCT 34.0*   < > 27.0*  PLT 143*  --   --    < > = values in this interval not displayed.   ------------------------------------------------------------------------------------------------------------------  Chemistries  Recent Labs  Lab 06/17/18 1950  06/19/18 0718  NA 134*   < > 139  K 7.3*   < > 3.3*  CL 114*   < > 108  CO2 <7*   < > 21*  GLUCOSE 171*   < > 130*  BUN 186*   < > 74*  CREATININE 12.51*   < > 5.72*  CALCIUM 8.0*   < > 6.8*  MG  --   --  1.6*  AST 11*  --   --   ALT 16  --   --   ALKPHOS 96  --   --   BILITOT 0.6  --   --    < > = values in this interval not displayed.   ------------------------------------------------------------------------------------------------------------------  Cardiac Enzymes Recent Labs  Lab 06/17/18 1950  TROPONINI 0.03*   ------------------------------------------------------------------------------------------------------------------  RADIOLOGY:  Dg Chest 1 View  Result Date: 06/18/2018 CLINICAL DATA:  Central line placement. EXAM: CHEST  1 VIEW COMPARISON:  Radiograph earlier this day. FINDINGS: Tip of the right central line projects over the proximal SVC. No visualized pneumothorax, however the lung apices are not included in the field of view. Patient is rotated. Asymmetric density of the hemi thoraces may be secondary to rotation. Grossly unchanged heart size from prior. This suspected  pleural effusion on prior exam is obscured by patient's hand. IMPRESSION: Tip of the right central line projects over the proximal SVC. No visualized pneumothorax, however both lung apices are not included in the field of view. Rotated exam with asymmetric density of the hemi thoraces, likely secondary to rotation, however limiting lung assessment. Electronically Signed   By: Keith Rake M.D.   On: 06/18/2018 00:23    Ct Head Wo Contrast  Result Date: 06/17/2018 CLINICAL DATA:  Altered mental status. EXAM: CT HEAD WITHOUT CONTRAST TECHNIQUE: Contiguous axial images were obtained from the base of the skull through the vertex without intravenous contrast. COMPARISON:  CT head dated January 05, 2005. FINDINGS: Brain: No evidence of acute infarction, hemorrhage, hydrocephalus, extra-axial collection or mass lesion/mass effect. Vascular: No hyperdense vessel or unexpected calcification. Skull: Normal. Negative for fracture or focal lesion. Sinuses/Orbits: No acute finding. Other: None. IMPRESSION: 1. Normal noncontrast head CT. Electronically Signed   By: Titus Dubin M.D.   On: 06/17/2018 20:34   US Renal  Result Date: 06/18/2018 CLINICAL DATA:  67 year old presenting with acute renal failure. EXAM: RENAL / URINARY TRACT ULTRASOUND COMPLETE COMPARISON:  Complete abdominal ultrasound 08/25/2011. FINDINGS: Right Kidney: Renal measurements: Approximately 10.0 x 6.0 x 5.4 cm = volume: 169 mL. No hydronephrosis. Diffuse cortical thinning. Renal volume loss since the prior examination in 2013 where the kidney measured approximately 13.9 cm in length. Echogenic parenchyma. 8 mm calculus in a LOWER pole calyx. No parenchymal masses. Left Kidney: Renal measurements: Approximately 10.5 x 6.2 x 6.5 cm = volume: 222 mL. No hydronephrosis. Cortical thinning involving the UPPER and LOWER poles. Renal volume loss since the prior examination in 2013 where the kidney measured approximately 11.8 cm in length. Echogenic parenchyma. 10 mm calculus in a mid calyx. No parenchymal masses. Bladder: Decompressed by Foley catheter. IMPRESSION: 1. No evidence of hydronephrosis involving either kidney to indicate urinary tract obstruction as the cause of acute renal failure. 2. Echogenic parenchyma in both kidneys indicating medical renal disease. 3. Diffuse cortical thinning involving the RIGHT kidney and cortical thinning involving the UPPER and  LOWER pole of the LEFT kidney. Significant volume loss in both kidneys when compared to a prior ultrasound in 2013. 4. BILATERAL nephrolithiasis. Electronically Signed   By: Evangeline Dakin M.D.   On: 06/18/2018 13:34   Dg Chest Port 1 View  Result Date: 06/18/2018 CLINICAL DATA:  Life support line placement. EXAM: PORTABLE CHEST 1 VIEW COMPARISON:  Chest radiograph June 17, 2018 FINDINGS: Patient rotated LEFT. Endotracheal tube tip projects 5.8 cm above the carina. Nasogastric tube past proximal stomach, distal tip out of field of view. RIGHT internal jugular sheath distal tip projects in proximal superior vena cava. Cardiac silhouette is upper limits of normal size. Mild interstitial prominence. No pleural effusion or focal consolidation. No pneumothorax. Soft tissue planes and included osseous structures are non suspicious. IMPRESSION: 1. Endotracheal tube tip projects 5.8 cm above the carina, nasogastric tube past proximal stomach. Stable appearance of RIGHT IJ sheath. 2. Mild cardiomegaly and interstitial prominence which may be chronic. Electronically Signed   By: Elon Alas M.D.   On: 06/18/2018 02:29   Dg Chest Portable 1 View  Result Date: 06/17/2018 CLINICAL DATA:  Altered mental status and weakness. EXAM: PORTABLE CHEST 1 VIEW COMPARISON:  None. FINDINGS: The heart size and mediastinal contours are within normal limits. Normal pulmonary vascularity. Possible trace left pleural effusion. No consolidation or pneumothorax. No acute osseous abnormality. IMPRESSION: 1. Possible trace left pleural  effusion. Electronically Signed   By: Titus Dubin M.D.   On: 06/17/2018 20:11    EKG:   Orders placed or performed during the hospital encounter of 06/17/18  . ED EKG  . EKG 12-Lead  . EKG 12-Lead  . ED EKG  . EKG 12-Lead  . EKG 12-Lead    ASSESSMENT AND PLAN:     This is a 67 year old female admitted for septic shock.  #Acute hypoxic respiratory failure Got intubated  06/18/2018 and extubated 06/19/2018 Intensivist is following  #.  Septic shock:  Clinically improving not in shock Urine culture with multiple species.  Get repeat urine culture IV Rocephin  # ESRD: Dialysis initiated emergently due to hyperkalemia.   Patient had dialysis yesterday and getting dialyzed today  #.  Hyperkalemia: Resolved with dialysis  #Hypomagnesemia replete and recheck . # Uremia: Contribute to encephalopathy on admission.      DVT prophylaxis: Heparin   GI prophylaxis: PPI   All the records are reviewed and case discussed with Care Management/Social Workerr. Management plans discussed with the patient, family and they are in agreement.  CODE STATUS: fc   TOTAL TIME TAKING CARE OF THIS PATIENT:  36  minutes.   POSSIBLE D/C IN  3  DAYS, DEPENDING ON CLINICAL CONDITION.  Note: This dictation was prepared with Dragon dictation along with smaller phrase technology. Any transcriptional errors that result from this process are unintentional.   Nicholes Mango M.D on 06/19/2018 at 3:28 PM  Between 7am to 6pm - Pager - [phone] After 6pm go to [email or web address] - password [email or web address] [facility]  Office  [phone]  CC: Primary care physician; Mar Daring, PA-C

## 2018-06-19 NOTE — Progress Notes (Signed)
HD tx end    06/19/18 1758  Vital Signs  Pulse Rate 71  Pulse Rate Source Monitor  Resp 13  BP 132/76  BP Location Right Arm  BP Method Automatic  Patient Position (if appropriate) Lying  Oxygen Therapy  SpO2 100 %  O2 Device Nasal Cannula  O2 Flow Rate (L/min) 1 L/min  During Hemodialysis Assessment  Dialysis Fluid Bolus Normal Saline  Bolus Amount (mL) 250 mL  Intra-Hemodialysis Comments Tx completed

## 2018-06-19 NOTE — Progress Notes (Signed)
Pre HD assessment   06/19/18 1427  Vital Signs  Temp 98.1 F (36.7 C)  Temp Source Oral  Pulse Rate 72  Pulse Rate Source Monitor  Resp 11  BP 117/62  BP Location Right Arm  BP Method Automatic  Patient Position (if appropriate) Lying  Oxygen Therapy  SpO2 96 %  O2 Device Room Air  Pain Assessment  Pain Scale 0-10  Pain Score 0  Dialysis Weight  Weight 107.5 kg  Type of Weight Pre-Dialysis  Time-Out for Hemodialysis  What Procedure? HD  Pt Identifiers(min of two) First/Last Name;MRN/Account#  Correct Site? Yes  Correct Side? Yes  Correct Procedure? Yes  Consents Verified? Yes  Rad Studies Available? N/A  Safety Precautions Reviewed? Yes  Engineer, civil (consulting) Number  (4A)  Station Number  (ICU 08 Bedside )  UF/Alarm Test Passed  Conductivity: Meter 13.6  Conductivity: Machine  13.7  pH 7.6  Reverse Osmosis WRO #3  Normal Saline Lot Number [id number]  Dialyzer Lot Number [id number]  Machine Temperature 98.6 F (37 C)  Musician and Audible Yes  Blood Lines Intact and Secured Yes  Pre Treatment Patient Checks  Vascular access used during treatment Catheter  Hepatitis B Surface Antigen Results Negative  Date Hepatitis B Surface Antigen Drawn 06/18/18  Hepatitis B Surface Antibody  (<10)  Date Hepatitis B Surface Antibody Drawn 06/18/18  Hemodialysis Consent Verified Yes  Hemodialysis Standing Orders Initiated Yes  ECG (Telemetry) Monitor On Yes  Prime Ordered Normal Saline  Length of  DialysisTreatment -hour(s) 3 Hour(s)  Dialyzer Elisio 17H NR  Dialysate 4K  Dialysis Anticoagulant None  Dialysate Flow Ordered 600  Blood Flow Rate Ordered 300 mL/min  Ultrafiltration Goal 0 Liters  Dialysis Blood Pressure Support Ordered Normal Saline  Education / Care Plan  Dialysis Education Provided Yes  Documented Education in Care Plan Yes  Hemodialysis Catheter Right Internal jugular  Placement Date/Time: 06/18/18 0000   Placed prior to admission: No   Ultrasound Used?: Yes  Person Inserting Catheter: Schnier MD  Orientation: Right  Access Location: Internal jugular  Site Condition No complications  Blue Lumen Status Heparin locked  Red Lumen Status Heparin locked  Purple Lumen Status N/A  Dressing Status Clean;Dry;Intact  Drainage Description None

## 2018-06-19 NOTE — Progress Notes (Signed)
Patient Extubated to 2L [place], per MD order, with no complications. Oxygen saturations are 100% at this time with no distress noted. Will continue to monitor.

## 2018-06-19 NOTE — Progress Notes (Signed)
Nutrition Follow Up Note   DOCUMENTATION CODES:   Obesity unspecified  INTERVENTION:   Nepro Shake po BID, each supplement provides 425 kcal and 19 grams protein  Rena-vite daily  Recommend liberal diet; will restrict phosphorus through health touch   NUTRITION DIAGNOSIS:   Increased nutrient needs related to acute/chronic illness(pt requiring new HD) as evidenced by estimated needs.  GOAL:   Patient will meet greater than or equal to 90% of their needs  MONITOR:   PO intake, Supplement acceptance, Labs, Weight trends, Skin, I & O's  ASSESSMENT:   67 y.o.caucasian female with diabetes, hypertension, kidney stones, chronic kidney disease stage IV, was admitted on 06/17/2018 with acute renal failure, severe acidosis, hyperkalemia, generalized weakness and decreased responsiveness.    Pt extubated today; tolerating well. Pt continues on dialysis; pt may have progressed to ESRD per nephrology. Pt to have bedside swallow exam today and initiate on diet if able. RD will add supplements and vitamins to help pt meet her estimated needs. Per chart, pt with ~25lb weight gain since admit; pt has been able to produce 858ml of urine and is +2.9L on her I & Os. Pt with elevated phosphorus today; renal diet does not restrict phosphorus so will restrict this through health touch.    Medications reviewed and include: heparin, insulin, ceftriaxone   Labs reviewed: K 3.3(L), BUN 74(H), creat 5.72(H), Ca 6.8(L), P 6.1(H), Mg 1.6(L) Hgb 9.2(L), Hct 27(L)  Diet Order:   Diet Order    None     EDUCATION NEEDS:   No education needs have been identified at this time  Skin:  Skin Assessment: Reviewed RN Assessment  Last BM:  12/16  Height:   Ht Readings from Last 1 Encounters:  06/18/18 5\' 9"  (1.753 m)   Weight:   Wt Readings from Last 1 Encounters:  06/19/18 106.6 kg   Ideal Body Weight:  65.9 kg  BMI:  Body mass index is 34.71 kg/m.  Estimated Nutritional Needs:   Kcal:   1700-2000kcal/day   Protein:  95-105g/day   Fluid:  1.6L/day   Koleen Distance MS, RD, LDN Pager #- [phone] Office#- [phone] After Hours Pager: [phone]

## 2018-06-19 NOTE — Progress Notes (Signed)
Debra Paul, [place] 06/19/18  Subjective:   Patient was extubated this morning.  She is alert and able to follow commands.  Currently on 1 L nasal cannula oxygen supplementation.  Urine output is relatively low.  805 cc last 24 hours.  Serum creatinine and BUN are lower.  Phosphorus remains high.  Potassium is low at 3.3.  Objective:  Vital signs in last 24 hours:  Temp:  [97.9 F (36.6 C)-99.1 F (37.3 C)] 98.2 F (36.8 C) (12/18 1030) Pulse Rate:  [66-88] 75 (12/18 1030) Resp:  [10-25] 12 (12/18 1030) BP: (90-155)/(59-81) 134/70 (12/18 1030) SpO2:  [97 %-100 %] 100 % (12/18 1030) FiO2 (%):  [30 %] 30 % (12/18 0815) Weight:  [104.6 kg-106.6 kg] 106.6 kg (12/18 0406)  Weight change: 9.345 kg Filed Weights   06/18/18 1313 06/18/18 1551 06/19/18 0406  Weight: 104.6 kg 104.6 kg 106.6 kg    Intake/Output:    Intake/Output Summary (Last 24 hours) at 06/19/2018 1055 Last data filed at 06/19/2018 1045 Gross per 24 hour  Intake 446.24 ml  Output 555 ml  Net -108.76 ml     Physical Exam: General:   ill-appearing, lying in the bed  HEENT  moist oral mucous membranes  Neck  no masses  Pulm/lungs  clear to auscultation bilaterally, 1 L [place] O2  CVS/Heart  regular  Abdomen:   Soft,   Extremities:  Trace edema  Neurologic:  Alert, able to follow commands  Skin:  No acute rashes  Access:  Right IJ temporary catheter       Basic Metabolic Panel:  Recent Labs  Lab 06/17/18 1950 06/17/18 1957 06/17/18 2205 06/18/18 0233 06/18/18 0505 06/19/18 0718  NA 134* 134* 135 139 140 139  K 7.3* 7.6* 7.0* 6.5* 3.6 3.3*  CL 114* 119* 119* 121* 114* 108  CO2 <7*  --  <7* <7*  --  21*  GLUCOSE 171* 164* 249* 144* 115* 130*  BUN 186* >140* 172* 152* 114* 74*  CREATININE 12.51* 15.60* 11.54* 11.57* 7.90* 5.72*  CALCIUM 8.0*  --  7.1* 7.1*  --  6.8*  MG  --   --   --   --   --  1.6*  PHOS  --   --   --  9.3*  9.3*  --  6.1*     CBC: Recent Labs   Lab 06/17/18 1950 06/17/18 1957 06/18/18 0505  WBC 15.3*  --   --   NEUTROABS 10.8*  --   --   HGB 10.5* 11.2* 9.2*  HCT 34.0* 33.0* 27.0*  MCV 89.9  --   --   PLT 143*  --   --       Lab Results  Component Value Date   HEPBSAG Negative 06/18/2018   HEPBSAB Non Reactive 06/18/2018      Microbiology:  Recent Results (from the past 240 hour(s))  Urine culture     Status: Abnormal   Collection Time: 06/17/18  7:46 PM  Result Value Ref Range Status   Specimen Description   Final    URINE, RANDOM Performed at [facility], [address] [place], [place] [address]    Special Requests   Final    NONE Performed at [facility], [address] [place], [place] [address]    Culture MULTIPLE SPECIES PRESENT, SUGGEST RECOLLECTION (A)  Final   Report Status 06/19/2018 FINAL  Final  Culture, blood (routine x 2)     Status:  None (Preliminary result)   Collection Time: 06/17/18  7:50 PM  Result Value Ref Range Status   Specimen Description BLOOD BLOOD RIGHT HAND  Final   Special Requests   Final    BOTTLES DRAWN AEROBIC AND ANAEROBIC Blood Culture results may not be optimal due to an excessive volume of blood received in culture bottles   Culture   Final    NO GROWTH 2 DAYS Performed at [facility], [address], [place], [place] [address]    Report Status PENDING  Incomplete  Culture, blood (routine x 2)     Status: None (Preliminary result)   Collection Time: 06/17/18  7:53 PM  Result Value Ref Range Status   Specimen Description BLOOD LEFT ANTECUBITAL  Final   Special Requests   Final    BOTTLES DRAWN AEROBIC AND ANAEROBIC Blood Culture adequate volume   Culture   Final    NO GROWTH 2 DAYS Performed at [facility], [address] [place], [place] [address]    Report Status PENDING  Incomplete  MRSA PCR Screening     Status: None   Collection Time: 06/18/18  9:03 AM  Result Value Ref Range Status   MRSA by PCR NEGATIVE  NEGATIVE Final    Comment:        The GeneXpert MRSA Assay (FDA approved for NASAL specimens only), is one component of a comprehensive MRSA colonization surveillance program. It is not intended to diagnose MRSA infection nor to guide or monitor treatment for MRSA infections. Performed at [facility], [address] [place], [place] [address]     Coagulation Studies: No results for input(s): LABPROT, INR in the last 72 hours.  Urinalysis: Recent Labs    06/17/18 1946  COLORURINE YELLOW*  LABSPEC 1.002*  PHURINE 5.0  GLUCOSEU NEGATIVE  HGBUR MODERATE*  BILIRUBINUR NEGATIVE  KETONESUR NEGATIVE  PROTEINUR 100*  NITRITE NEGATIVE  LEUKOCYTESUR LARGE*      Imaging: Dg Chest 1 View  Result Date: 06/18/2018 CLINICAL DATA:  Central line placement. EXAM: CHEST  1 VIEW COMPARISON:  Radiograph earlier this day. FINDINGS: Tip of the right central line projects over the proximal SVC. No visualized pneumothorax, however the lung apices are not included in the field of view. Patient is rotated. Asymmetric density of the hemi thoraces may be secondary to rotation. Grossly unchanged heart size from prior. This suspected pleural effusion on prior exam is obscured by patient's hand. IMPRESSION: Tip of the right central line projects over the proximal SVC. No visualized pneumothorax, however both lung apices are not included in the field of view. Rotated exam with asymmetric density of the hemi thoraces, likely secondary to rotation, however limiting lung assessment. Electronically Signed   By: Keith Rake M.D.   On: 06/18/2018 00:23   Ct Head Wo Contrast  Result Date: 06/17/2018 CLINICAL DATA:  Altered mental status. EXAM: CT HEAD WITHOUT CONTRAST TECHNIQUE: Contiguous axial images were obtained from the base of the skull through the vertex without intravenous contrast. COMPARISON:  CT head dated January 05, 2005. FINDINGS: Brain: No evidence of acute infarction, hemorrhage,  hydrocephalus, extra-axial collection or mass lesion/mass effect. Vascular: No hyperdense vessel or unexpected calcification. Skull: Normal. Negative for fracture or focal lesion. Sinuses/Orbits: No acute finding. Other: None. IMPRESSION: 1. Normal noncontrast head CT. Electronically Signed   By: Titus Dubin M.D.   On: 06/17/2018 20:34   US Renal  Result Date: 06/18/2018 CLINICAL DATA:  67 year old presenting with acute renal failure.  EXAM: RENAL / URINARY TRACT ULTRASOUND COMPLETE COMPARISON:  Complete abdominal ultrasound 08/25/2011. FINDINGS: Right Kidney: Renal measurements: Approximately 10.0 x 6.0 x 5.4 cm = volume: 169 mL. No hydronephrosis. Diffuse cortical thinning. Renal volume loss since the prior examination in 2013 where the kidney measured approximately 13.9 cm in length. Echogenic parenchyma. 8 mm calculus in a LOWER pole calyx. No parenchymal masses. Left Kidney: Renal measurements: Approximately 10.5 x 6.2 x 6.5 cm = volume: 222 mL. No hydronephrosis. Cortical thinning involving the UPPER and LOWER poles. Renal volume loss since the prior examination in 2013 where the kidney measured approximately 11.8 cm in length. Echogenic parenchyma. 10 mm calculus in a mid calyx. No parenchymal masses. Bladder: Decompressed by Foley catheter. IMPRESSION: 1. No evidence of hydronephrosis involving either kidney to indicate urinary tract obstruction as the cause of acute renal failure. 2. Echogenic parenchyma in both kidneys indicating medical renal disease. 3. Diffuse cortical thinning involving the RIGHT kidney and cortical thinning involving the UPPER and LOWER pole of the LEFT kidney. Significant volume loss in both kidneys when compared to a prior ultrasound in 2013. 4. BILATERAL nephrolithiasis. Electronically Signed   By: Evangeline Dakin M.D.   On: 06/18/2018 13:34   Dg Chest Port 1 View  Result Date: 06/18/2018 CLINICAL DATA:  Life support line placement. EXAM: PORTABLE CHEST 1 VIEW  COMPARISON:  Chest radiograph June 17, 2018 FINDINGS: Patient rotated LEFT. Endotracheal tube tip projects 5.8 cm above the carina. Nasogastric tube past proximal stomach, distal tip out of field of view. RIGHT internal jugular sheath distal tip projects in proximal superior vena cava. Cardiac silhouette is upper limits of normal size. Mild interstitial prominence. No pleural effusion or focal consolidation. No pneumothorax. Soft tissue planes and included osseous structures are non suspicious. IMPRESSION: 1. Endotracheal tube tip projects 5.8 cm above the carina, nasogastric tube past proximal stomach. Stable appearance of RIGHT IJ sheath. 2. Mild cardiomegaly and interstitial prominence which may be chronic. Electronically Signed   By: Elon Alas M.D.   On: 06/18/2018 02:29   Dg Chest Portable 1 View  Result Date: 06/17/2018 CLINICAL DATA:  Altered mental status and weakness. EXAM: PORTABLE CHEST 1 VIEW COMPARISON:  None. FINDINGS: The heart size and mediastinal contours are within normal limits. Normal pulmonary vascularity. Possible trace left pleural effusion. No consolidation or pneumothorax. No acute osseous abnormality. IMPRESSION: 1. Possible trace left pleural effusion. Electronically Signed   By: Titus Dubin M.D.   On: 06/17/2018 20:11     Medications:   . cefTRIAXone (ROCEPHIN)  IV     . chlorhexidine gluconate (MEDLINE KIT)  15 mL Mouth Rinse BID  . Chlorhexidine Gluconate Cloth  6 each Topical Q0600  . heparin  5,000 Units Subcutaneous Q8H  . insulin aspart  0-15 Units Subcutaneous Q4H   acetaminophen **OR** acetaminophen, ondansetron **OR** ondansetron (ZOFRAN) IV  Assessment/ Plan:  67 y.o.caucasian female with diabetes, hypertension, kidney stones, chronic kidney disease stage IV, was admitted on 06/17/2018 with acute renal failure, severe acidosis, hyperkalemia, generalized weakness and decreased responsiveness.   1. Acute renal failure on chronic kidney  disease stage IV 2. Severe Hyperkalemia, now hypokalemia 3. Severe Acidosis 4.  Urinary tract infection 5.  Acute respiratory failure 6.  DM-2 with CKD. Last HbA1c 6.8% (04/2018)  Baseline creatinine 2.13/GFR 24 from October 17, 2017 Ultrasound shows bilateral cortical thinning as well as nephrolithiasis.  Patient may have progressed to ESRD.  We will continue to monitor closely and decide about dialysis on  a daily basis.  Urine output remains relatively low, we will do another dialysis treatment today.  Follow electrolytes and urine output closely.  Assess daily.  Currently getting antibiotics for urinary tract infection.    LOS: [place] 12/18/201910:55 [place], [place]  Note: This note was prepared with Dragon dictation. Any transcription errors are unintentional

## 2018-06-19 NOTE — Consult Note (Signed)
Name: Debra Paul MRN: [id number] DOB: March 08, 1951    ADMISSION DATE:  06/17/2018 CONSULTATION DATE: 06/18/2018  REFERRING MD : Dr. Marcille Blanco   CHIEF COMPLAINT: Weakness and Diarrhea    SIGNIFICANT EVENTS  12/17-Pt admitted to ICU mechanically intubated   STUDIES:  CT Head 12/16>>normal noncontrast head CT  HISTORY OF PRESENT ILLNESS:   Remains on  Vent More alert and awake Plan for trial of extubation today  REVIEW OF SYSTEMS:   Unable to assess pt intubated   VITAL SIGNS: Temp:  [97.3 F (36.3 C)-99.1 F (37.3 C)] 98.4 F (36.9 C) (12/18 0815) Pulse Rate:  [66-91] 83 (12/18 0815) Resp:  [10-25] 25 (12/18 0815) BP: (90-134)/(59-81) 134/62 (12/18 0815) SpO2:  [100 %] 100 % (12/18 0815) FiO2 (%):  [30 %] 30 % (12/18 0305) Weight:  [104.6 kg-106.6 kg] 106.6 kg (12/18 0406)  PHYSICAL EXAMINATION:  GENERAL:critically ill appearing, +resp distress HEAD: Normocephalic, atraumatic.  EYES: Pupils equal, round, reactive to light.  No scleral icterus.  MOUTH: Moist mucosal membrane. NECK: Supple. No thyromegaly. No nodules. No JVD.  PULMONARY: +rhonchi,  CARDIOVASCULAR: S1 and S2. Regular rate and rhythm. No murmurs, rubs, or gallops.  GASTROINTESTINAL: Soft, nontender, -distended. No masses. Positive bowel sounds. No hepatosplenomegaly.  MUSCULOSKELETAL: No swelling, clubbing, or edema.  NEUROLOGIC: alert and awake SKIN:intact,warm,dry      Recent Labs  Lab 06/17/18 2205 06/18/18 0233 06/18/18 0505 06/19/18 0718  NA 135 139 140 139  K 7.0* 6.5* 3.6 3.3*  CL 119* 121* 114* 108  CO2 <7* <7*  --  21*  BUN 172* 152* 114* 74*  CREATININE 11.54* 11.57* 7.90* 5.72*  GLUCOSE 249* 144* 115* 130*   Recent Labs  Lab 06/17/18 1950 06/17/18 1957 06/18/18 0505  HGB 10.5* 11.2* 9.2*  HCT 34.0* 33.0* 27.0*  WBC 15.3*  --   --   PLT 143*  --   --    Dg Chest 1 View  Result Date: 06/18/2018 CLINICAL DATA:  Central line placement. EXAM: CHEST  1 VIEW  COMPARISON:  Radiograph earlier this day. FINDINGS: Tip of the right central line projects over the proximal SVC. No visualized pneumothorax, however the lung apices are not included in the field of view. Patient is rotated. Asymmetric density of the hemi thoraces may be secondary to rotation. Grossly unchanged heart size from prior. This suspected pleural effusion on prior exam is obscured by patient's hand. IMPRESSION: Tip of the right central line projects over the proximal SVC. No visualized pneumothorax, however both lung apices are not included in the field of view. Rotated exam with asymmetric density of the hemi thoraces, likely secondary to rotation, however limiting lung assessment. Electronically Signed   By: Keith Rake M.D.   On: 06/18/2018 00:23   Ct Head Wo Contrast  Result Date: 06/17/2018 CLINICAL DATA:  Altered mental status. EXAM: CT HEAD WITHOUT CONTRAST TECHNIQUE: Contiguous axial images were obtained from the base of the skull through the vertex without intravenous contrast. COMPARISON:  CT head dated January 05, 2005. FINDINGS: Brain: No evidence of acute infarction, hemorrhage, hydrocephalus, extra-axial collection or mass lesion/mass effect. Vascular: No hyperdense vessel or unexpected calcification. Skull: Normal. Negative for fracture or focal lesion. Sinuses/Orbits: No acute finding. Other: None. IMPRESSION: 1. Normal noncontrast head CT. Electronically Signed   By: Titus Dubin M.D.   On: 06/17/2018 20:34   US Renal  Result Date: 06/18/2018 CLINICAL DATA:  67 year old presenting with acute renal failure. EXAM: RENAL / URINARY TRACT ULTRASOUND  COMPLETE COMPARISON:  Complete abdominal ultrasound 08/25/2011. FINDINGS: Right Kidney: Renal measurements: Approximately 10.0 x 6.0 x 5.4 cm = volume: 169 mL. No hydronephrosis. Diffuse cortical thinning. Renal volume loss since the prior examination in 2013 where the kidney measured approximately 13.9 cm in length. Echogenic  parenchyma. 8 mm calculus in a LOWER pole calyx. No parenchymal masses. Left Kidney: Renal measurements: Approximately 10.5 x 6.2 x 6.5 cm = volume: 222 mL. No hydronephrosis. Cortical thinning involving the UPPER and LOWER poles. Renal volume loss since the prior examination in 2013 where the kidney measured approximately 11.8 cm in length. Echogenic parenchyma. 10 mm calculus in a mid calyx. No parenchymal masses. Bladder: Decompressed by Foley catheter. IMPRESSION: 1. No evidence of hydronephrosis involving either kidney to indicate urinary tract obstruction as the cause of acute renal failure. 2. Echogenic parenchyma in both kidneys indicating medical renal disease. 3. Diffuse cortical thinning involving the RIGHT kidney and cortical thinning involving the UPPER and LOWER pole of the LEFT kidney. Significant volume loss in both kidneys when compared to a prior ultrasound in 2013. 4. BILATERAL nephrolithiasis. Electronically Signed   By: Evangeline Dakin M.D.   On: 06/18/2018 13:34   Dg Chest Port 1 View  Result Date: 06/18/2018 CLINICAL DATA:  Life support line placement. EXAM: PORTABLE CHEST 1 VIEW COMPARISON:  Chest radiograph June 17, 2018 FINDINGS: Patient rotated LEFT. Endotracheal tube tip projects 5.8 cm above the carina. Nasogastric tube past proximal stomach, distal tip out of field of view. RIGHT internal jugular sheath distal tip projects in proximal superior vena cava. Cardiac silhouette is upper limits of normal size. Mild interstitial prominence. No pleural effusion or focal consolidation. No pneumothorax. Soft tissue planes and included osseous structures are non suspicious. IMPRESSION: 1. Endotracheal tube tip projects 5.8 cm above the carina, nasogastric tube past proximal stomach. Stable appearance of RIGHT IJ sheath. 2. Mild cardiomegaly and interstitial prominence which may be chronic. Electronically Signed   By: Elon Alas M.D.   On: 06/18/2018 02:29   Dg Chest Portable 1  View  Result Date: 06/17/2018 CLINICAL DATA:  Altered mental status and weakness. EXAM: PORTABLE CHEST 1 VIEW COMPARISON:  None. FINDINGS: The heart size and mediastinal contours are within normal limits. Normal pulmonary vascularity. Possible trace left pleural effusion. No consolidation or pneumothorax. No acute osseous abnormality. IMPRESSION: 1. Possible trace left pleural effusion. Electronically Signed   By: Titus Dubin M.D.   On: 06/17/2018 20:11    ASSESSMENT / PLAN: 67 yo female admitted with sepsis secondary to UTI, acute on chronic renal failure with severe hyperkalemia, and severe metabolic acidosis resulting in acute severe respiratory failure requiring mechanical intubation and emergent hemodialysis    Severe Hypoxic and Hypercapnic Respiratory Failure -continue Full MV support -continue Bronchodilator Therapy -Wean Fio2 and PEEP as tolerated -will perform SAT/SBTTwhen respiratory parameters are met Plan for trial of extubation today  Renal Failure- -follow chem 7 -follow UO -continue Foley Catheter-assess need IHD per nephrology   INFECTIOUS DISEASE -continue antibiotics as prescribed -follow up cultures UTI Gastroenteritis    ENDO - ICU hypoglycemic\Hyperglycemia protocol -check FSBS per protocol     Patient/Family are satisfied with Plan of action and management. All questions answered  Corrin Parker, M.D.  [facility]  Medical Director [place] Director [facility] Cardio-Pulmonary Department

## 2018-06-20 LAB — GLUCOSE, CAPILLARY
Glucose-Capillary: 102 mg/dL — ABNORMAL HIGH (ref 70–99)
Glucose-Capillary: 103 mg/dL — ABNORMAL HIGH (ref 70–99)
Glucose-Capillary: 106 mg/dL — ABNORMAL HIGH (ref 70–99)
Glucose-Capillary: 112 mg/dL — ABNORMAL HIGH (ref 70–99)
Glucose-Capillary: 235 mg/dL — ABNORMAL HIGH (ref 70–99)

## 2018-06-20 MED ORDER — AMLODIPINE BESYLATE 5 MG PO TABS
2.5000 mg | ORAL_TABLET | Freq: Every day | ORAL | Status: DC
  Administered 2018-06-20 – 2018-06-22 (×3): 2.5 mg via ORAL
  Filled 2018-06-20 (×3): qty 1

## 2018-06-20 MED ORDER — PRAVASTATIN SODIUM 20 MG PO TABS
20.0000 mg | ORAL_TABLET | Freq: Every day | ORAL | Status: DC
  Administered 2018-06-20 – 2018-06-22 (×3): 20 mg via ORAL
  Filled 2018-06-20 (×3): qty 1

## 2018-06-20 MED ORDER — MAGNESIUM SULFATE 2 GM/50ML IV SOLN
2.0000 g | Freq: Once | INTRAVENOUS | Status: AC
  Administered 2018-06-20: 2 g via INTRAVENOUS
  Filled 2018-06-20: qty 50

## 2018-06-20 NOTE — Progress Notes (Signed)
[facility], [place] 06/20/18  Subjective:   Overall doing better. More alert and is able to follow commands.  Currently on 1 L nasal cannula oxygen supplementation.  Urine output is relatively low 300 cc.   no nausea or vomiting  Objective:  Vital signs in last 24 hours:  Temp:  [97.7 F (36.5 C)-98.9 F (37.2 C)] 98.7 F (37.1 C) (12/19 1600) Pulse Rate:  [60-78] 76 (12/19 1615) Resp:  [10-20] 13 (12/19 1615) BP: (109-154)/(66-94) 154/79 (12/19 1615) SpO2:  [96 %-100 %] 97 % (12/19 1615) Weight:  [104.6 kg-107.2 kg] 104.6 kg (12/19 0500)  Weight change: 2.9 kg Filed Weights   06/19/18 1427 06/19/18 1806 06/20/18 0500  Weight: 107.5 kg 107.2 kg 104.6 kg    Intake/Output:    Intake/Output Summary (Last 24 hours) at 06/20/2018 1630 Last data filed at 06/20/2018 1517 Gross per 24 hour  Intake 0 ml  Output 711 ml  Net -711 ml     Physical Exam: General:   ill-appearing, lying in the bed  HEENT  moist oral mucous membranes  Neck  no masses  Pulm/lungs  clear to auscultation bilaterally, 1 L [place] O2  CVS/Heart  regular  Abdomen:   Soft,   Extremities:  Trace edema  Neurologic:  Alert, able to follow commands  Skin:  No acute rashes  Access:  Right IJ temporary catheter       Basic Metabolic Panel:  Recent Labs  Lab 06/17/18 1950 06/17/18 1957 06/17/18 2205 06/18/18 0233 06/18/18 0505 06/19/18 0718  NA 134* 134* 135 139 140 139  K 7.3* 7.6* 7.0* 6.5* 3.6 3.3*  CL 114* 119* 119* 121* 114* 108  CO2 <7*  --  <7* <7*  --  21*  GLUCOSE 171* 164* 249* 144* 115* 130*  BUN 186* >140* 172* 152* 114* 74*  CREATININE 12.51* 15.60* 11.54* 11.57* 7.90* 5.72*  CALCIUM 8.0*  --  7.1* 7.1*  --  6.8*  MG  --   --   --   --   --  1.6*  PHOS  --   --   --  9.3*  9.3*  --  6.1*     CBC: Recent Labs  Lab 06/17/18 1950 06/17/18 1957 06/18/18 0505  WBC 15.3*  --   --   NEUTROABS 10.8*  --   --   HGB 10.5* 11.2* 9.2*  HCT 34.0* 33.0*  27.0*  MCV 89.9  --   --   PLT 143*  --   --       Lab Results  Component Value Date   HEPBSAG Negative 06/18/2018   HEPBSAB Non Reactive 06/18/2018      Microbiology:  Recent Results (from the past 240 hour(s))  Urine culture     Status: Abnormal   Collection Time: 06/17/18  7:46 PM  Result Value Ref Range Status   Specimen Description   Final    URINE, RANDOM Performed at [facility], [address] [place], [place] [address]    Special Requests   Final    NONE Performed at [facility], [address] [place], [place] [address]    Culture MULTIPLE SPECIES PRESENT, SUGGEST RECOLLECTION (A)  Final   Report Status 06/19/2018 FINAL  Final  Culture, blood (routine x 2)     Status: None (Preliminary result)   Collection Time: 06/17/18  7:50 PM  Result Value Ref Range Status   Specimen Description BLOOD BLOOD RIGHT HAND  Final  Special Requests   Final    BOTTLES DRAWN AEROBIC AND ANAEROBIC Blood Culture results may not be optimal due to an excessive volume of blood received in culture bottles   Culture   Final    NO GROWTH 3 DAYS Performed at [facility], [address], [place], [place] [address]    Report Status PENDING  Incomplete  Culture, blood (routine x 2)     Status: None (Preliminary result)   Collection Time: 06/17/18  7:53 PM  Result Value Ref Range Status   Specimen Description BLOOD LEFT ANTECUBITAL  Final   Special Requests   Final    BOTTLES DRAWN AEROBIC AND ANAEROBIC Blood Culture adequate volume   Culture   Final    NO GROWTH 3 DAYS Performed at [facility], [address], [place], [place] [address]    Report Status PENDING  Incomplete  MRSA PCR Screening     Status: None   Collection Time: 06/18/18  9:03 AM  Result Value Ref Range Status   MRSA by PCR NEGATIVE NEGATIVE Final    Comment:        The GeneXpert MRSA Assay (FDA approved for NASAL specimens only), is one component of a comprehensive  MRSA colonization surveillance program. It is not intended to diagnose MRSA infection nor to guide or monitor treatment for MRSA infections. Performed at [facility], [address] [place], [place] [address]     Coagulation Studies: No results for input(s): LABPROT, INR in the last 72 hours.  Urinalysis: Recent Labs    06/17/18 1946  COLORURINE YELLOW*  LABSPEC 1.002*  PHURINE 5.0  GLUCOSEU NEGATIVE  HGBUR MODERATE*  BILIRUBINUR NEGATIVE  KETONESUR NEGATIVE  PROTEINUR 100*  NITRITE NEGATIVE  LEUKOCYTESUR LARGE*      Imaging: No results found.   Medications:   . cefTRIAXone (ROCEPHIN)  IV 1 g (06/20/18 1000)  . magnesium sulfate 1 - 4 g bolus IVPB     . amLODipine  2.5 mg Oral Daily  . chlorhexidine gluconate (MEDLINE KIT)  15 mL Mouth Rinse BID  . Chlorhexidine Gluconate Cloth  6 each Topical Q0600  . heparin  5,000 Units Subcutaneous Q8H  . insulin aspart  0-15 Units Subcutaneous Q4H  . pravastatin  20 mg Oral q1800   acetaminophen **OR** acetaminophen, ondansetron **OR** ondansetron (ZOFRAN) IV  Assessment/ Plan:  67 y.o.caucasian female with diabetes, hypertension, kidney stones, chronic kidney disease stage IV, was admitted on 06/17/2018 with acute renal failure, severe acidosis, hyperkalemia, generalized weakness and decreased responsiveness.   1. Acute renal failure on chronic kidney disease stage IV 2. Severe Hyperkalemia, now hypokalemia 3. Severe Acidosis 4. Urinary tract infection 5. Acute respiratory failure 6. DM-2 with CKD. Last HbA1c 6.8% (04/2018)  Baseline creatinine 2.13/GFR 24 from October 17, 2017 Ultrasound shows bilateral cortical thinning as well as nephrolithiasis.  Patient may have progressed to ESRD.  We will continue to monitor closely and decide about dialysis on a daily basis.  Urine output remains relatively low, we will do another dialysis treatment today.  Follow electrolytes and urine output closely.   Currently  getting antibiotics for urinary tract infection. Permcath planned for tomorrow Outpatient planning for AKI dialysis    LOS: [place] 12/19/20194:30 PM  [place], [place]  Note: This note was prepared with Dragon dictation. Any transcription errors are unintentional

## 2018-06-20 NOTE — Progress Notes (Signed)
Pre HD Treatment    06/20/18 1600  Vital Signs  Temp 98.7 F (37.1 C)  Temp Source Oral  Pulse Rate 74  Pulse Rate Source Monitor  Resp 11  BP (!) 146/79  BP Location Right Arm  BP Method Automatic  Patient Position (if appropriate) Sitting  Oxygen Therapy  SpO2 97 %  O2 Device Room Air  Pain Assessment  Pain Scale 0-10  Pain Score 0  Dialysis Weight  Weight  (unable to obtain)  Type of Weight Pre-Dialysis  Time-Out for Hemodialysis  What Procedure? HD  Pt Identifiers(min of two) First/Last Name;MRN/Account#;Pt's DOB(use if MRN/Acct# not available  Correct Site? Yes  Correct Side? Yes  Correct Procedure? Yes  Consents Verified? Yes  Rad Studies Available? N/A  Safety Precautions Reviewed? Yes  Engineer, civil (consulting) Number 3  Station Number 2  UF/Alarm Test Passed  Conductivity: Meter 13.8  Conductivity: Machine  13.7  pH 7.4  Reverse Osmosis Main  Normal Saline Lot Number [id number]  Dialyzer Lot Number [id number]  Disposable Set Lot Number [id number]  Machine Temperature 98.6 F (37 C)  Musician and Audible Yes  Blood Lines Intact and Secured Yes  Pre Treatment Patient Checks  Vascular access used during treatment Catheter  Patient is receiving dialysis in a chair Yes  Hepatitis B Surface Antigen Results Negative  Date Hepatitis B Surface Antigen Drawn 06/18/18  Hepatitis B Surface Antibody  (<10)  Date Hepatitis B Surface Antibody Drawn 06/18/18  Hemodialysis Consent Verified Yes  Hemodialysis Standing Orders Initiated Yes  ECG (Telemetry) Monitor On Yes  Prime Ordered Normal Saline  Length of  DialysisTreatment -hour(s) 3 Hour(s)  Dialysis Treatment Comments Na 140  Dialyzer Elisio 17H NR  Dialysate 4K  Dialysis Anticoagulant None  Dialysate Flow Ordered 600  Blood Flow Rate Ordered 300 mL/min  Ultrafiltration Goal 0 Liters  Dialysis Blood Pressure Support Ordered Normal Saline  Education / Care Plan  Dialysis Education Provided Yes   Documented Education in Care Plan Yes  Hemodialysis Catheter Right Internal jugular  Placement Date/Time: 06/18/18 0000   Placed prior to admission: No  Ultrasound Used?: Yes  Person Inserting Catheter: Schnier MD  Orientation: Right  Access Location: Internal jugular  Site Condition No complications  Blue Lumen Status Capped (Central line)  Red Lumen Status Capped (Central line)  Purple Lumen Status Saline locked  Dressing Status Clean;Dry;Dressing reinforced  Interventions Dressing reinforced  Drainage Description None

## 2018-06-20 NOTE — Progress Notes (Signed)
Post HD Treatment  Pt tolerated treatment well. Her net UF was 14 mL and her BVP was 51.4. All blood was returned to patient upon end of treatment. Reviewed education with patient on diet and process of dialysis. Report called to primary Debra Paul. All questions answered.     06/20/18 1930  Hand-Off documentation  Report given to (Full Name) Debra Numbers, Debra Paul  Report received from (Full Name) Stephannie Peters, Debra Paul  Vital Signs  Temp 98.4 F (36.9 C)  Temp Source Oral  Pulse Rate 74  Pulse Rate Source Monitor  Resp 15  BP (!) 168/84  BP Location Left Arm  BP Method Automatic  Patient Position (if appropriate) Sitting  Oxygen Therapy  SpO2 96 %  O2 Device Room Air  Pain Assessment  Pain Scale 0-10  Pain Score 0  Dialysis Weight  Weight  (Unable to weigh at this time)  Type of Weight Post-Dialysis  Post-Hemodialysis Assessment  Rinseback Volume (mL) 250 mL  Dialyzer Clearance Lightly streaked  Duration of HD Treatment -hour(s) 3 hour(s)  Hemodialysis Intake (mL) 500 mL  UF Total -Machine (mL) 514 mL  Net UF (mL) 14 mL  Tolerated HD Treatment Yes  Post-Hemodialysis Comments Tolerated well  Hemodialysis Catheter Right Internal jugular  Placement Date/Time: 06/18/18 0000   Placed prior to admission: No  Ultrasound Used?: Yes  Person Inserting Catheter: Schnier MD  Orientation: Right  Access Location: Internal jugular  Site Condition No complications  Blue Lumen Status Capped (Central line);Heparin locked  Red Lumen Status Capped (Central line);Heparin locked  Purple Lumen Status Saline locked  Catheter fill solution Heparin 1000 units/ml  Catheter fill volume (Arterial) 1.4 cc  Catheter fill volume (Venous) 1.4  Dressing Type Occlusive  Dressing Status Clean;Dry;Intact  Interventions Dressing reinforced  Drainage Description None  Post treatment catheter status Capped and Clamped

## 2018-06-20 NOTE — Progress Notes (Signed)
Post HD Assessment    06/20/18 1935  Neurological  Level of Consciousness Alert  Orientation Level Oriented X4  Respiratory  Respiratory Pattern Regular;Unlabored  Chest Assessment Chest expansion symmetrical  Bilateral Breath Sounds Diminished  Cardiac  Pulse Regular  Heart Sounds S1, S2  Jugular Venous Distention (JVD) No  ECG Monitor Yes  Cardiac Rhythm NSR  Vascular  R Radial Pulse +2  L Radial Pulse +2  R Dorsalis Pedis Pulse +2  L Dorsalis Pedis Pulse +2  Edema Generalized;Facial  Generalized Edema +1  Facial +1  Integumentary  Integumentary (WDL) X  Skin Color Appropriate for ethnicity  Skin Condition Dry  Skin Integrity Intact  Musculoskeletal  Musculoskeletal (WDL) X  Generalized Weakness Yes  GU Assessment  Genitourinary (WDL) X  Genitourinary Symptoms Urinary Catheter  Psychosocial  Psychosocial (WDL) WDL

## 2018-06-20 NOTE — Progress Notes (Signed)
[facility] [place] NAME: Debra Paul    MR#:  [id number]  DATE OF BIRTH:  [birth year]  SUBJECTIVE:  CHIEF COMPLAINT: Patient was extubated 06/19/2018 and had hemodialysis yesterday and scheduled for today  REVIEW OF SYSTEMS:  CONSTITUTIONAL: No fever, reporting weakness.  EYES: No blurred or double vision.  EARS, NOSE, AND THROAT: No tinnitus or ear pain.  RESPIRATORY: No cough, shortness of breath CARDIOVASCULAR: No chest pain, orthopnea, edema.  GASTROINTESTINAL: No nausea, vomiting, diarrhea or abdominal pain.  SKIN: No rash or lesion. MUSCULOSKELETAL: No joint pain or arthritis.   NEUROLOGIC: No tingling, numbness, weakness.  PSYCHIATRY: No anxiety or depression.   DRUG ALLERGIES:   Allergies  Allergen Reactions  . Prilosec [Omeprazole] Other (See Comments)    Reaction: unknown  . Ciprofloxacin Rash    VITALS:  Blood pressure 135/77, pulse 60, temperature 97.7 F (36.5 C), temperature source Oral, resp. rate 18, height 5\' 9"  (1.753 m), weight 104.6 kg, SpO2 98 %.  PHYSICAL EXAMINATION:  GENERAL:  67 y.o.-year-old patient lying in the bed with no acute distress.  EYES: Pupils equal, round, reactive to light and accommodation. No scleral icterus. Extraocular muscles intact.  HEENT: Head atraumatic, normocephalic. Oropharynx and nasopharynx clear.  NECK:  Supple, no jugular venous distention. No thyroid enlargement, no tenderness.  LUNGS: Normal breath sounds bilaterally, no wheezing, rales,rhonchi or crepitation. No use of accessory muscles of respiration.  CARDIOVASCULAR: S1, S2 normal. No murmurs, rubs, or gallops.  ABDOMEN: Soft, nontender, nondistended. Bowel sounds present.   EXTREMITIES: No pedal edema, cyanosis, or clubbing.  NEUROLOGIC: Awake, alert and oriented x3 sensation intact. Gait not checked.  PSYCHIATRIC: The patient is alert and oriented x 3.  SKIN: No obvious rash, lesion, or ulcer.     LABORATORY PANEL:   CBC Recent Labs  Lab 06/17/18 1950  06/18/18 0505  WBC 15.3*  --   --   HGB 10.5*   < > 9.2*  HCT 34.0*   < > 27.0*  PLT 143*  --   --    < > = values in this interval not displayed.   ------------------------------------------------------------------------------------------------------------------  Chemistries  Recent Labs  Lab 06/17/18 1950  06/19/18 0718  NA 134*   < > 139  K 7.3*   < > 3.3*  CL 114*   < > 108  CO2 <7*   < > 21*  GLUCOSE 171*   < > 130*  BUN 186*   < > 74*  CREATININE 12.51*   < > 5.72*  CALCIUM 8.0*   < > 6.8*  MG  --   --  1.6*  AST 11*  --   --   ALT 16  --   --   ALKPHOS 96  --   --   BILITOT 0.6  --   --    < > = values in this interval not displayed.   ------------------------------------------------------------------------------------------------------------------  Cardiac Enzymes Recent Labs  Lab 06/17/18 1950  TROPONINI 0.03*   ------------------------------------------------------------------------------------------------------------------  RADIOLOGY:  US Renal  Result Date: 06/18/2018 CLINICAL DATA:  67 year old presenting with acute renal failure. EXAM: RENAL / URINARY TRACT ULTRASOUND COMPLETE COMPARISON:  Complete abdominal ultrasound 08/25/2011. FINDINGS: Right Kidney: Renal measurements: Approximately 10.0 x 6.0 x 5.4 cm = volume: 169 mL. No hydronephrosis. Diffuse cortical thinning. Renal volume loss since the prior examination in 2013 where the kidney measured approximately 13.9 cm in length. Echogenic parenchyma. 8 mm calculus in a LOWER  pole calyx. No parenchymal masses. Left Kidney: Renal measurements: Approximately 10.5 x 6.2 x 6.5 cm = volume: 222 mL. No hydronephrosis. Cortical thinning involving the UPPER and LOWER poles. Renal volume loss since the prior examination in 2013 where the kidney measured approximately 11.8 cm in length. Echogenic parenchyma. 10 mm calculus in a mid calyx. No  parenchymal masses. Bladder: Decompressed by Foley catheter. IMPRESSION: 1. No evidence of hydronephrosis involving either kidney to indicate urinary tract obstruction as the cause of acute renal failure. 2. Echogenic parenchyma in both kidneys indicating medical renal disease. 3. Diffuse cortical thinning involving the RIGHT kidney and cortical thinning involving the UPPER and LOWER pole of the LEFT kidney. Significant volume loss in both kidneys when compared to a prior ultrasound in 2013. 4. BILATERAL nephrolithiasis. Electronically Signed   By: Evangeline Dakin M.D.   On: 06/18/2018 13:34    EKG:   Orders placed or performed during the hospital encounter of 06/17/18  . ED EKG  . EKG 12-Lead  . EKG 12-Lead  . ED EKG  . EKG 12-Lead  . EKG 12-Lead    ASSESSMENT AND PLAN:     This is a 67 year old female admitted for septic shock.  #Acute hypoxic respiratory failure Got intubated 06/18/2018 and extubated 06/19/2018 Transfer to floor 06/19/2018  #.  Septic shock:  Clinically improving, not in shock Urine culture with multiple species.  repeat urine culture from 06/19/2018 is pending IV Rocephin  # ESRD: Dialysis initiated emergently due to hyperkalemia. Hyperkalemia resolved now  Patient had dialysis yesterday and getting dialyzed today Consult vascular surgery for permacath placement.  N.p.o. after midnight Discussed with nephrology and appreciate the recommendations  #.  Hyperkalemia: Resolved with dialysis  #Hypomagnesemia replete and recheck . # Uremia: Contribute to encephalopathy on admission.  #Generalized weakness PT consult placed ambulate with assistance each shift   Disposition-Case management consulted regarding discharge planning as patient needs outpatient hemodialysis after discharge    DVT prophylaxis: Heparin   GI prophylaxis: PPI   All the records are reviewed and case discussed with Care Management/Social Workerr. Management plans discussed with  the patient, mom at bedside and they are in agreement.  CODE STATUS: fc   TOTAL TIME TAKING CARE OF THIS PATIENT:  36  minutes.   POSSIBLE D/C IN  3  DAYS, DEPENDING ON CLINICAL CONDITION.  Note: This dictation was prepared with Dragon dictation along with smaller phrase technology. Any transcriptional errors that result from this process are unintentional.   Nicholes Mango M.D on 06/20/2018 at 10:26 AM  Between 7am to 6pm - Pager - [phone] After 6pm go to [email or web address] - password [email or web address] [facility]  Office  [phone]  CC: Primary care physician; Mar Daring, PA-C

## 2018-06-20 NOTE — Progress Notes (Signed)
HD Treatment Complete    06/20/18 1915  Vital Signs  Pulse Rate 66  Pulse Rate Source Monitor  Resp 16  BP (!) 180/82  BP Location Left Arm  BP Method Automatic  Patient Position (if appropriate) Sitting  Oxygen Therapy  SpO2 98 %  O2 Device Room Air  During Hemodialysis Assessment  Blood Flow Rate (mL/min) 300 mL/min  Arterial Pressure (mmHg) -70 mmHg  Venous Pressure (mmHg) 50 mmHg  Transmembrane Pressure (mmHg) 60 mmHg  Ultrafiltration Rate (mL/min) 170 mL/min  Dialysate Flow Rate (mL/min) 600 ml/min  Conductivity: Machine  13.7  HD Safety Checks Performed Yes  Intra-Hemodialysis Comments Tolerated well;Tx completed

## 2018-06-20 NOTE — Progress Notes (Signed)
Pre HD Assessment    06/20/18 1600  Neurological  Level of Consciousness Alert  Orientation Level Oriented X4  Respiratory  Respiratory Pattern Regular;Unlabored;Symmetrical  Chest Assessment Chest expansion symmetrical  Bilateral Breath Sounds Diminished  Cardiac  Pulse Regular  Heart Sounds S1, S2  Jugular Venous Distention (JVD) No  ECG Monitor Yes  Cardiac Rhythm NSR  Vascular  R Radial Pulse +2  L Radial Pulse +2  R Dorsalis Pedis Pulse +2  L Dorsalis Pedis Pulse +2  Edema Generalized;Facial  Generalized Edema +1  Facial +1  Integumentary  Integumentary (WDL) X  Skin Color Appropriate for ethnicity  Skin Condition Dry  Skin Integrity Intact  Musculoskeletal  Musculoskeletal (WDL) X  Generalized Weakness Yes  GU Assessment  Genitourinary (WDL) X  Genitourinary Symptoms Urinary Catheter  Psychosocial  Psychosocial (WDL) WDL

## 2018-06-20 NOTE — Evaluation (Signed)
Physical Therapy Evaluation Patient Details Name: Debra Paul MRN: [id number] DOB: 08-24-1950 Today's Date: 06/20/2018   History of Present Illness  Pt is a 67 y/o F who presented with malaise and diarrhea.  Potassium greater than 7 and dialysis catheter was placed.  Pt was intubated and has since been extubated.  Pt no longer in septic shock.  Pt's PMH includes vertigo, parathyroid disorder.     Clinical Impression  Pt admitted with above diagnosis. Pt currently with functional limitations due to the deficits listed below (see PT Problem List). Pt is independent at baseline with ADLs and all aspects of mobility without use of AD.  She demonstrates a guarded posture with OOB mobility this session which she attributes to "just feeling nervous" as this is her first time OOB.  Supervision>min guard provided with mobility.  SpO2 remains in the high 90s throughout session.  Pt will benefit from skilled PT to increase their independence and safety with mobility to allow discharge to the venue listed below.      Follow Up Recommendations Home health PT    Equipment Recommendations  None recommended by PT    Recommendations for Other Services       Precautions / Restrictions Precautions Precautions: Fall;Other (comment) Precaution Comments: RN messaged Dr. Candiss Norse at [facility] on 12/19 who confirmed that pt is permitted to participate in OOB mobility with PT with no restrictions, despite temporary IJ catheter.  Restrictions Weight Bearing Restrictions: No      Mobility  Bed Mobility Overal bed mobility: Needs Assistance Bed Mobility: Supine to Sit;Sit to Supine     Supine to sit: Supervision;HOB elevated Sit to supine: Supervision   General bed mobility comments: Cues to keep head facing forward to avoid cervical rotation with supine<>sit.  Increased effort required.   Transfers Overall transfer level: Needs assistance Equipment used: None Transfers: Sit to/from Stand Sit to  Stand: Min guard         General transfer comment: Pt demonstrates mild instability but no LOB.  Very guarded posture.    Ambulation/Gait Ambulation/Gait assistance: Min guard Gait Distance (Feet): 35 Feet Assistive device: None Gait Pattern/deviations: Decreased step length - right;Decreased step length - left Gait velocity: decreased   General Gait Details: Very guarded posture with pt holding her hands out from her body due to mild unsteadiness.  No LOB.    Stairs            Wheelchair Mobility    Modified Rankin (Stroke Patients Only)       Balance Overall balance assessment: Needs assistance Sitting-balance support: No upper extremity supported;Feet supported Sitting balance-Leahy Scale: Good     Standing balance support: No upper extremity supported;During functional activity Standing balance-Leahy Scale: Fair Standing balance comment: Pt able to stand statically and ambulate without UE support but would likely lose her balance with perturbation.                              Pertinent Vitals/Pain Pain Assessment: No/denies pain    Home Living Family/patient expects to be discharged to:: Private residence Living Arrangements: Alone Available Help at Discharge: Family;Available PRN/intermittently Type of Home: House Home Access: Stairs to enter Entrance Stairs-Rails: None Entrance Stairs-Number of Steps: 2 Home Layout: One level Home Equipment: Grab bars - tub/shower;Shower seat      Prior Function Level of Independence: Independent         Comments: Ambulating without AD.  Ind  with ADLs.  Pt still driving.  Pt not on O2 at baseline. No falls in the past 3 months.      Hand Dominance        Extremity/Trunk Assessment   Upper Extremity Assessment Upper Extremity Assessment: Overall WFL for tasks assessed    Lower Extremity Assessment Lower Extremity Assessment: (BLE strength grossly 4/5)    Cervical / Trunk  Assessment Cervical / Trunk Assessment: Other exceptions Cervical / Trunk Exceptions: temporary R IJ catheter  Communication   Communication: No difficulties  Cognition Arousal/Alertness: Awake/alert Behavior During Therapy: WFL for tasks assessed/performed Overall Cognitive Status: Within Functional Limits for tasks assessed                                        General Comments General comments (skin integrity, edema, etc.): SpO2 remains in the high 90s throughout session    Exercises Other Exercises Other Exercises: Encouraged pt to ambulate with nursing staff at least 3x/day   Assessment/Plan    PT Assessment Patient needs continued PT services  PT Problem List Decreased strength;Decreased balance;Decreased safety awareness;Decreased knowledge of use of DME       PT Treatment Interventions DME instruction;Gait training;Stair training;Functional mobility training;Therapeutic activities;Therapeutic exercise;Balance training;Neuromuscular re-education;Patient/family education    PT Goals (Current goals can be found in the Care Plan section)  Acute Rehab PT Goals Patient Stated Goal: to return to [facility] and get back to playing golf PT Goal Formulation: With patient Time For Goal Achievement: 07/04/18 Potential to Achieve Goals: Good    Frequency Min 2X/week   Barriers to discharge        Co-evaluation               AM-PAC PT "6 Clicks" Mobility  Outcome Measure Help needed turning from your back to your side while in a flat bed without using bedrails?: None Help needed moving from lying on your back to sitting on the side of a flat bed without using bedrails?: None Help needed moving to and from a bed to a chair (including a wheelchair)?: A Little Help needed standing up from a chair using your arms (e.g., wheelchair or bedside chair)?: A Little Help needed to walk in hospital room?: A Little Help needed climbing 3-5 steps with a railing? : A  Little 6 Click Score: 20    End of Session Equipment Utilized During Treatment: Gait belt Activity Tolerance: Patient tolerated treatment well Patient left: in bed;with call bell/phone within reach;with bed alarm set;with family/visitor present Nurse Communication: Mobility status;Other (comment)(SpO2) PT Visit Diagnosis: Unsteadiness on feet (R26.81);Other abnormalities of gait and mobility (R26.89)    Time: 1127-1200 PT Time Calculation (min) (ACUTE ONLY): 33 min   Charges:   PT Evaluation $PT Eval Low Complexity: 1 Low PT Treatments $Gait Training: 8-22 mins       Collie Siad PT, DPT 06/20/2018, 2:09 PM

## 2018-06-20 NOTE — Care Management (Signed)
[name] HD liaison aware of need out outpatient referral.  She states that she has started a referral to [facility].  Patient currently off the floor for HD.  I have requested that patient go to HD in the chair.  PT has assessed patient and recommends home health PT.  RNCM to follow up

## 2018-06-20 NOTE — Progress Notes (Signed)
HD Treatment Initiated    06/20/18 1608  Vital Signs  Pulse Rate 76  Resp 17  Oxygen Therapy  SpO2 97 %  During Hemodialysis Assessment  Blood Flow Rate (mL/min) 300 mL/min  Arterial Pressure (mmHg) -50 mmHg  Venous Pressure (mmHg) 40 mmHg  Transmembrane Pressure (mmHg) 70 mmHg  Ultrafiltration Rate (mL/min) 170 mL/min  Dialysate Flow Rate (mL/min) 600 ml/min  Conductivity: Machine  13.8  HD Safety Checks Performed Yes  Dialysis Fluid Bolus Normal Saline  Bolus Amount (mL) 250 mL  Intra-Hemodialysis Comments Tx initiated  Hemodialysis Catheter Right Internal jugular  Placement Date/Time: 06/18/18 0000   Placed prior to admission: No  Ultrasound Used?: Yes  Person Inserting Catheter: Schnier MD  Orientation: Right  Access Location: Internal jugular  Blue Lumen Status Infusing  Red Lumen Status Infusing  Purple Lumen Status Saline locked

## 2018-06-21 ENCOUNTER — Inpatient Hospital Stay
Admission: EM | Disposition: A | Discharge status: Home Health | Payer: Self-pay | Source: Home / Self Care | Attending: Internal Medicine

## 2018-06-21 ENCOUNTER — Encounter: Payer: Self-pay | Admitting: Emergency Medicine

## 2018-06-21 DIAGNOSIS — N179 Acute kidney failure, unspecified: Secondary | ICD-10-CM

## 2018-06-21 HISTORY — PX: DIALYSIS/PERMA CATHETER INSERTION: CATH118288

## 2018-06-21 LAB — URINALYSIS, ROUTINE W REFLEX MICROSCOPIC
Bilirubin Urine: NEGATIVE
Glucose, UA: NEGATIVE mg/dL
Ketones, ur: NEGATIVE mg/dL
Nitrite: NEGATIVE
Protein, ur: 100 mg/dL — AB
Specific Gravity, Urine: 1.011 (ref 1.005–1.030)
WBC, UA: >50 WBC/hpf — ABNORMAL HIGH (ref 0–5)
pH: 5 (ref 5.0–8.0)

## 2018-06-21 LAB — BASIC METABOLIC PANEL
Anion gap: 10 (ref 5–15)
BUN: 34 mg/dL — ABNORMAL HIGH (ref 8–23)
CO2: 26 mmol/L (ref 22–32)
Calcium: 7.1 mg/dL — ABNORMAL LOW (ref 8.9–10.3)
Chloride: 101 mmol/L (ref 98–111)
Creatinine, Ser: 2.81 mg/dL — ABNORMAL HIGH (ref 0.44–1.00)
GFR calc Af Amer: 19 mL/min — ABNORMAL LOW (ref >60)
GFR calc non Af Amer: 17 mL/min — ABNORMAL LOW (ref >60)
Glucose, Bld: 137 mg/dL — ABNORMAL HIGH (ref 70–99)
Potassium: 3.6 mmol/L (ref 3.5–5.1)
Sodium: 137 mmol/L (ref 135–145)

## 2018-06-21 LAB — GLUCOSE, CAPILLARY
Glucose-Capillary: 118 mg/dL — ABNORMAL HIGH (ref 70–99)
Glucose-Capillary: 124 mg/dL — ABNORMAL HIGH (ref 70–99)
Glucose-Capillary: 126 mg/dL — ABNORMAL HIGH (ref 70–99)
Glucose-Capillary: 126 mg/dL — ABNORMAL HIGH (ref 70–99)
Glucose-Capillary: 128 mg/dL — ABNORMAL HIGH (ref 70–99)
Glucose-Capillary: 180 mg/dL — ABNORMAL HIGH (ref 70–99)
Glucose-Capillary: 224 mg/dL — ABNORMAL HIGH (ref 70–99)

## 2018-06-21 LAB — MAGNESIUM: Magnesium: 2.4 mg/dL (ref 1.7–2.4)

## 2018-06-21 LAB — URINE CULTURE

## 2018-06-21 LAB — CBC
HCT: 25.3 % — ABNORMAL LOW (ref 36.0–46.0)
Hemoglobin: 7.9 g/dL — ABNORMAL LOW (ref 12.0–15.0)
MCH: 27.6 pg (ref 26.0–34.0)
MCHC: 31.2 g/dL (ref 30.0–36.0)
MCV: 88.5 fL (ref 80.0–100.0)
Platelets: 73 10*3/uL — ABNORMAL LOW (ref 150–400)
RBC: 2.86 MIL/uL — ABNORMAL LOW (ref 3.87–5.11)
RDW: 14.2 % (ref 11.5–15.5)
WBC: 5.1 10*3/uL (ref 4.0–10.5)
nRBC: 0 % (ref 0.0–0.2)

## 2018-06-21 SURGERY — DIALYSIS/PERMA CATHETER INSERTION
Anesthesia: Moderate Sedation | Laterality: Left

## 2018-06-21 MED ORDER — HEPARIN SODIUM (PORCINE) 10000 UNIT/ML IJ SOLN
INTRAMUSCULAR | Status: AC
  Filled 2018-06-21: qty 1

## 2018-06-21 MED ORDER — OXYCODONE HCL 5 MG PO TABS
5.0000 mg | ORAL_TABLET | Freq: Four times a day (QID) | ORAL | Status: DC | PRN
  Administered 2018-06-21: 5 mg via ORAL
  Filled 2018-06-21: qty 1

## 2018-06-21 MED ORDER — MIDAZOLAM HCL 5 MG/5ML IJ SOLN
INTRAMUSCULAR | Status: AC
  Filled 2018-06-21: qty 5

## 2018-06-21 MED ORDER — SODIUM CHLORIDE FLUSH 0.9 % IV SOLN
INTRAVENOUS | Status: AC
  Filled 2018-06-21: qty 30

## 2018-06-21 MED ORDER — MIDAZOLAM HCL 2 MG/2ML IJ SOLN
INTRAMUSCULAR | Status: DC | PRN
  Administered 2018-06-21: 2 mg via INTRAVENOUS
  Administered 2018-06-21: 1 mg via INTRAVENOUS

## 2018-06-21 MED ORDER — LIDOCAINE-EPINEPHRINE (PF) 1 %-1:200000 IJ SOLN
INTRAMUSCULAR | Status: AC
  Filled 2018-06-21: qty 20

## 2018-06-21 MED ORDER — HEPARIN (PORCINE) IN NACL 1000-0.9 UT/500ML-% IV SOLN
INTRAVENOUS | Status: AC
  Filled 2018-06-21: qty 500

## 2018-06-21 MED ORDER — NEPRO/CARBSTEADY PO LIQD
237.0000 mL | Freq: Two times a day (BID) | ORAL | Status: DC
  Administered 2018-06-21 – 2018-06-22 (×2): 237 mL via ORAL

## 2018-06-21 MED ORDER — CEFAZOLIN SODIUM-DEXTROSE 2-4 GM/100ML-% IV SOLN
2.0000 g | INTRAVENOUS | Status: AC
  Administered 2018-06-21: 2 g via INTRAVENOUS
  Filled 2018-06-21: qty 100

## 2018-06-21 MED ORDER — FENTANYL CITRATE (PF) 100 MCG/2ML IJ SOLN
INTRAMUSCULAR | Status: AC
  Filled 2018-06-21: qty 2

## 2018-06-21 MED ORDER — RENA-VITE PO TABS
1.0000 | ORAL_TABLET | Freq: Every day | ORAL | Status: DC
  Administered 2018-06-21: 1 via ORAL
  Filled 2018-06-21 (×2): qty 1

## 2018-06-21 MED ORDER — FENTANYL CITRATE (PF) 100 MCG/2ML IJ SOLN
INTRAMUSCULAR | Status: DC | PRN
  Administered 2018-06-21 (×2): 50 ug via INTRAVENOUS

## 2018-06-21 SURGICAL SUPPLY — 8 items
CATH PALINDROME RT-P 15FX23CM (CATHETERS) ×2 IMPLANT
DERMABOND ADVANCED (GAUZE/BANDAGES/DRESSINGS) ×1
DERMABOND ADVANCED .7 DNX12 (GAUZE/BANDAGES/DRESSINGS) ×1 IMPLANT
NEEDLE ENTRY 21GA 7CM ECHOTIP (NEEDLE) ×2 IMPLANT
PACK ANGIOGRAPHY (CUSTOM PROCEDURE TRAY) ×2 IMPLANT
SET INTRO CAPELLA COAXIAL (SET/KITS/TRAYS/PACK) ×2 IMPLANT
SUT MNCRL AB 4-0 PS2 18 (SUTURE) ×2 IMPLANT
SUT SILK 0 FSL (SUTURE) ×2 IMPLANT

## 2018-06-21 NOTE — Care Management (Signed)
Per [name] HD liaison patient can start outpatient HD at [facility] on Monday at [place].  After the first session she will then transition to her regular TTS schedule   PT has no upgraded patient to outpatient PT.  RNCM to follow up with patient over the weekend

## 2018-06-21 NOTE — Progress Notes (Signed)
[facility], [place] 06/21/18  Subjective:   Patient appears to be stronger and doing better.  She underwent left IJ PermCath placement today.  Able to eat without nausea or vomiting.    Objective:  Vital signs in last 24 hours:  Temp:  [97.9 F (36.6 C)-98.4 F (36.9 C)] 98.3 F (36.8 C) (12/20 1234) Pulse Rate:  [61-74] 67 (12/20 1234) Resp:  [12-20] 16 (12/20 1234) BP: (127-180)/(74-107) 144/79 (12/20 1234) SpO2:  [94 %-99 %] 98 % (12/20 1234) Weight:  [104.6 kg] 104.6 kg (12/20 0950)  Weight change:  Filed Weights   06/20/18 0500 06/21/18 0950  Weight: 104.6 kg 104.6 kg    Intake/Output:    Intake/Output Summary (Last 24 hours) at 06/21/2018 1723 Last data filed at 06/21/2018 1600 Gross per 24 hour  Intake 840 ml  Output 264 ml  Net 576 ml     Physical Exam: General:  No acute distress, lying in the bed  HEENT  moist oral mucous membranes  Neck  no masses  Pulm/lungs  clear to auscultation bilaterally,   CVS/Heart  regular  Abdomen:   Soft,   Extremities:  Trace edema  Neurologic:  Alert, able to follow commands  Skin:  No acute rashes  Access:  Right IJ temporary catheter, left IJ tunneled catheter       Basic Metabolic Panel:  Recent Labs  Lab 06/17/18 1950  06/17/18 2205 06/18/18 0233 06/18/18 0505 06/19/18 0718 06/21/18 0447  NA 134*   < > 135 139 140 139 137  K 7.3*   < > 7.0* 6.5* 3.6 3.3* 3.6  CL 114*   < > 119* 121* 114* 108 101  CO2 <7*  --  <7* <7*  --  21* 26  GLUCOSE 171*   < > 249* 144* 115* 130* 137*  BUN 186*   < > 172* 152* 114* 74* 34*  CREATININE 12.51*   < > 11.54* 11.57* 7.90* 5.72* 2.81*  CALCIUM 8.0*  --  7.1* 7.1*  --  6.8* 7.1*  MG  --   --   --   --   --  1.6* 2.4  PHOS  --   --   --  9.3*  9.3*  --  6.1*  --    < > = values in this interval not displayed.     CBC: Recent Labs  Lab 06/17/18 1950 06/17/18 1957 06/18/18 0505 06/21/18 0447  WBC 15.3*  --   --  5.1  NEUTROABS 10.8*   --   --   --   HGB 10.5* 11.2* 9.2* 7.9*  HCT 34.0* 33.0* 27.0* 25.3*  MCV 89.9  --   --  88.5  PLT 143*  --   --  73*      Lab Results  Component Value Date   HEPBSAG Negative 06/18/2018   HEPBSAB Non Reactive 06/18/2018      Microbiology:  Recent Results (from the past 240 hour(s))  Urine culture     Status: Abnormal   Collection Time: 06/17/18  7:46 PM  Result Value Ref Range Status   Specimen Description   Final    URINE, RANDOM Performed at [facility], [address] [place], [place] [address]    Special Requests   Final    NONE Performed at [facility], [address], [place],  [address]    Culture MULTIPLE SPECIES PRESENT, SUGGEST RECOLLECTION (A)  Final   Report Status 06/19/2018 FINAL  Final  Culture, blood (routine x 2)     Status: None (Preliminary result)   Collection Time: 06/17/18  7:50 PM  Result Value Ref Range Status   Specimen Description BLOOD BLOOD RIGHT HAND  Final   Special Requests   Final    BOTTLES DRAWN AEROBIC AND ANAEROBIC Blood Culture results may not be optimal due to an excessive volume of blood received in culture bottles   Culture   Final    NO GROWTH 4 DAYS Performed at [facility], [address] [place], [place] [address]    Report Status PENDING  Incomplete  Culture, blood (routine x 2)     Status: None (Preliminary result)   Collection Time: 06/17/18  7:53 PM  Result Value Ref Range Status   Specimen Description BLOOD LEFT ANTECUBITAL  Final   Special Requests   Final    BOTTLES DRAWN AEROBIC AND ANAEROBIC Blood Culture adequate volume   Culture   Final    NO GROWTH 4 DAYS Performed at [facility], [address], [place], [place] [address]    Report Status PENDING  Incomplete  MRSA PCR Screening     Status: None   Collection Time: 06/18/18  9:03 AM  Result Value Ref Range Status   MRSA by PCR NEGATIVE NEGATIVE Final    Comment:        The GeneXpert MRSA Assay  (FDA approved for NASAL specimens only), is one component of a comprehensive MRSA colonization surveillance program. It is not intended to diagnose MRSA infection nor to guide or monitor treatment for MRSA infections. Performed at [facility], [address], [place], [place] [address]   Urine Culture     Status: Abnormal   Collection Time: 06/19/18  1:56 PM  Result Value Ref Range Status   Specimen Description   Final    URINE, RANDOM Performed at [facility], [address] [place], [place] [address]    Special Requests   Final    NONE Performed at [facility], [address] [place],  [address]    Culture MULTIPLE SPECIES PRESENT, SUGGEST RECOLLECTION (A)  Final   Report Status 06/21/2018 FINAL  Final    Coagulation Studies: No results for input(s): LABPROT, INR in the last 72 hours.  Urinalysis: No results for input(s): COLORURINE, LABSPEC, PHURINE, GLUCOSEU, HGBUR, BILIRUBINUR, KETONESUR, PROTEINUR, UROBILINOGEN, NITRITE, LEUKOCYTESUR in the last 72 hours.  Invalid input(s): APPERANCEUR    Imaging: No results found.   Medications:   . cefTRIAXone (ROCEPHIN)  IV 1 g (06/21/18 1209)   . amLODipine  2.5 mg Oral Daily  . chlorhexidine gluconate (MEDLINE KIT)  15 mL Mouth Rinse BID  . Chlorhexidine Gluconate Cloth  6 each Topical Q0600  . feeding supplement (NEPRO CARB STEADY)  237 mL Oral BID BM  . insulin aspart  0-15 Units Subcutaneous Q4H  . multivitamin  1 tablet Oral QHS  . pravastatin  20 mg Oral q1800   acetaminophen **OR** acetaminophen, ondansetron **OR** ondansetron (ZOFRAN) IV, oxyCODONE  Assessment/ Plan:  67 y.o.caucasian female with diabetes, hypertension, kidney stones, chronic kidney disease stage IV, was admitted on 06/17/2018 with acute renal failure, severe acidosis, hyperkalemia, generalized weakness and decreased responsiveness.   1. Acute renal failure on chronic kidney disease stage IV 2.  Severe Hyperkalemia, now hypokalemia 3. Severe Acidosis 4. Urinary tract infection 5. Acute respiratory failure 6. DM-2 with CKD. Last HbA1c 6.8% (04/2018)  Baseline creatinine 2.13/GFR 24 from October 17, 2017 Ultrasound shows bilateral cortical thinning as well as nephrolithiasis.  Not sure if patient has progressed to ESRD.  We will continue to monitor closely and decide about dialysis on a daily basis.  Urine output remains relatively low, we will probably do another dialysis treatment tomorrow.    We will set her up for outpatient dialysis for acute kidney injury. See care management notes    LOS: [place] 12/20/20195:23 [facility], [place]  Note: This note was prepared with Dragon dictation. Any transcription errors are unintentional

## 2018-06-21 NOTE — Progress Notes (Signed)
Physical Therapy Treatment Patient Details Name: Debra Paul MRN: [id number] DOB: 12/06/1950 Today's Date: 06/21/2018    History of Present Illness Pt is a 67 y/o F who presented with malaise and diarrhea.  Potassium greater than 7 and temporary dialysis catheter was placed.  Pt was intubated and has since been extubated.  Pt no longer in septic shock.  L IJ dialysis permcath placed 06/21/18.  Pt's PMH includes vertigo, parathyroid disorder.     PT Comments    Pt reporting ambulating 2 laps around nursing loop with nursing assist last night using RW.  Pt able to ambulate around nursing loop 2x's (no AD) with PT but appearing mildly unsteady (although only requiring CGA for safety).  Trialed SPC and improved balance noted with ambulation.  Recommending SPC and OP PT upon hospital discharge (CM notified of updated recommendations).  Will continue to progress pt with strengthening, balance, and progressive functional mobility per pt tolerance.    Follow Up Recommendations  Outpatient PT     Equipment Recommendations  Cane    Recommendations for Other Services       Precautions / Restrictions Precautions Precautions: Fall;Other (comment) Precaution Comments: RN messaged Dr. Candiss Norse at [facility] on 12/19 who confirmed that pt is permitted to participate in OOB mobility with PT with no restrictions, despite temporary IJ catheter.  Restrictions Weight Bearing Restrictions: No    Mobility  Bed Mobility Overal bed mobility: Modified Independent       Supine to sit: Modified independent (Device/Increase time) Sit to supine: Modified independent (Device/Increase time)   General bed mobility comments: Semi-supine to/from sit with mild increased effort  Transfers Overall transfer level: Needs assistance Equipment used: None Transfers: Sit to/from Stand Sit to Stand: Min guard         General transfer comment: mild increased effort to stand on own x2 trials from bed with single  UE support  Ambulation/Gait Ambulation/Gait assistance: Min guard Gait Distance (Feet): (350 feet no AD; 175 feet with SPC) Assistive device: None;Straight cane       General Gait Details: pt initially with wide BOS, decreased B LE step length, increased lateral sway B, and mildly unsteady with no AD but improved balance noted with vc's to normalize gait speed; improved steadiness noted with ambulation using SPC (pt educated on use)   Marine scientist [place] (Stroke Patients Only)       Balance Overall balance assessment: Needs assistance Sitting-balance support: No upper extremity supported;Feet supported Sitting balance-Leahy Scale: Good Sitting balance - Comments: steady sitting reaching within BOS     Standing balance-Leahy Scale: Good Standing balance comment: mild unsteadiness ambulating with no UE support  Practiced increasing/decreasing speed with ambulation (no loss of balance requiring assist to correct noted although pt appearing more unsteady with decreased speed with occasionally altered stepping pattern but pt able to self correct).                          Cognition Arousal/Alertness: Awake/alert Behavior During Therapy: WFL for tasks assessed/performed Overall Cognitive Status: Within Functional Limits for tasks assessed                                        Exercises  Gait training    General Comments  Nursing cleared pt for participation in physical therapy.  Pt agreeable to PT session.       Pertinent Vitals/Pain Pain Assessment: No/denies pain  Vitals (HR and O2 on room air) stable and WFL throughout treatment session.    Home Living                      Prior Function            PT Goals (current goals can now be found in the care plan section) Acute Rehab PT Goals Patient Stated Goal: to return to PLOF and get back to playing golf PT Goal Formulation: With  patient Time For Goal Achievement: 07/04/18 Potential to Achieve Goals: Good Progress towards PT goals: Progressing toward goals    Frequency    Min 2X/week      PT Plan Equipment recommendations need to be updated;Discharge plan needs to be updated(CM notified)    Co-evaluation              AM-PAC PT "6 Clicks" Mobility   Outcome Measure  Help needed turning from your back to your side while in a flat bed without using bedrails?: None Help needed moving from lying on your back to sitting on the side of a flat bed without using bedrails?: None Help needed moving to and from a bed to a chair (including a wheelchair)?: A Little Help needed standing up from a chair using your arms (e.g., wheelchair or bedside chair)?: A Little Help needed to walk in hospital room?: A Little Help needed climbing 3-5 steps with a railing? : A Little 6 Click Score: 20    End of Session Equipment Utilized During Treatment: Gait belt Activity Tolerance: Patient tolerated treatment well Patient left: in bed;with call bell/phone within reach;with bed alarm set Nurse Communication: Mobility status;Precautions PT Visit Diagnosis: Unsteadiness on feet (R26.81);Other abnormalities of gait and mobility (R26.89)     Time: 1545-1610 PT Time Calculation (min) (ACUTE ONLY): 25 min  Charges:  $Gait Training: 23-37 mins                     Session was performed by student PT, Belva Crome, and directed, overseen, and documented by this PT.  Leitha Bleak, PT 06/21/18, 4:55 PM [phone]

## 2018-06-21 NOTE — Progress Notes (Signed)
Received report from [facility] in dialysis that no fluids were pulled and patient sat up in chair during treatment.

## 2018-06-21 NOTE — Progress Notes (Signed)
PT Cancellation Note  Patient Details Name: Debra Paul MRN: [id number] DOB: 14-Mar-1951   Cancelled Treatment:    Reason Eval/Treat Not Completed: Patient at procedure or test/unavailable.  Pt currently off floor at procedure.  Will re-attempt PT treatment session at a later date/time  Leitha Bleak, PT 06/21/18, 10:09 AM [phone]

## 2018-06-21 NOTE — Op Note (Signed)
OPERATIVE NOTE   PROCEDURE: 1. Insertion of tunneled dialysis catheter left IJ approach with ultrasound and fluoroscopic guidance.  PRE-OPERATIVE DIAGNOSIS: acute renal failure  POST-OPERATIVE DIAGNOSIS: Same  SURGEON: Hortencia Pilar.  ANESTHESIA: Conscious sedation was administered under my direct supervision by the interventional radiology RN. IV Versed plus fentanyl were utilized. Continuous ECG, pulse oximetry and blood pressure was monitored throughout the entire procedure. Conscious sedation was for a total of 32 minutes.  ESTIMATED BLOOD LOSS: Minimal cc  CONTRAST USED:  None  FLUOROSCOPY TIME:  1.6 minutes  INDICATIONS:   Debra Paul a 67 y.o. y.o. female who presents with acute renal failure.  Risk and benefits were reviewed the patient.  Indications for the procedure were reviewed.  All questions were answered, the patient agrees to proceed with tunneled catheter insertion.   DESCRIPTION: After obtaining full informed written consent, the patient was positioned supine. The left neck and chest wall was prepped and draped in a sterile fashion. Ultrasound was placed in a sterile sleeve. Ultrasound was utilized to identify the left IJ vein which is noted to be echolucent and compressible indicating patency. Image is recorded for the permanent record. Under direct ultrasound visualization a micro-needle is inserted into the vein followed by the micro-wire. Micro-sheath was then advanced and a J wire is inserted without difficulty under fluoroscopic guidance. Small counterincision was made at the wire insertion site. Dilators are passed over the wire and the tunneled dialysis catheter is fed into the central venous system without difficulty.  Under fluoroscopy the catheter tip positioned at the atrial caval junction. The catheter is then approximated to the chest wall and an exit site selected. 1% lidocaine is infiltrated in soft tissues at this level small incision is made  and the tunneling device is then passed from the exit site to the neck counterincision. Catheter is then connected to the tunneling device and the catheter was pulled subcutaneously. It is then transected and the hub assembly connected without difficulty. Both lumens aspirate and flush easily. After verification of smooth contour with proper tip position under fluoroscopy the catheter is packed with 5000 units of heparin per lumen.  Catheter secured to the skin of the left chest with 0 silk. A sterile dressing is applied with a Biopatch.  COMPLICATIONS: None  CONDITION: Good  Hortencia Pilar [place] [facility]. Office:  [phone]   06/21/2018,11:02 AM

## 2018-06-21 NOTE — Progress Notes (Signed)
[place] [facility] [place] NAME: Debra Paul    MR#:  [id number]  DATE OF BIRTH:  Mar 20, 1951  SUBJECTIVE:  CHIEF COMPLAINT: Patient was extubated 06/19/2018 and had hemodialysis 06/19/2018 , 06/20/2018 and had permacath placement done in the right groin ,scheduled for hemodialysis  REVIEW OF SYSTEMS:  CONSTITUTIONAL: No fever, reporting weakness.  EYES: No blurred or double vision.  EARS, NOSE, AND THROAT: No tinnitus or ear pain.  RESPIRATORY: No cough, shortness of breath CARDIOVASCULAR: No chest pain, orthopnea, edema.  GASTROINTESTINAL: No nausea, vomiting, diarrhea or abdominal pain.  SKIN: No rash or lesion. MUSCULOSKELETAL: No joint pain or arthritis.   NEUROLOGIC: No tingling, numbness, weakness.  PSYCHIATRY: No anxiety or depression.   DRUG ALLERGIES:   Allergies  Allergen Reactions  . Prilosec [Omeprazole] Other (See Comments)    Reaction: unknown  . Ciprofloxacin Rash    VITALS:  Blood pressure (!) 144/79, pulse 67, temperature 98.3 F (36.8 C), temperature source Oral, resp. rate 16, height 5\' 9"  (1.753 m), weight 104.6 kg, SpO2 98 %.  PHYSICAL EXAMINATION:  GENERAL:  67 y.o.-year-old patient lying in the bed with no acute distress.  EYES: Pupils equal, round, reactive to light and accommodation. No scleral icterus. Extraocular muscles intact.  HEENT: Head atraumatic, normocephalic. Oropharynx and nasopharynx clear.  NECK:  Supple, no jugular venous distention. No thyroid enlargement, no tenderness.  LUNGS: Normal breath sounds bilaterally, no wheezing, rales,rhonchi or crepitation. No use of accessory muscles of respiration.  CARDIOVASCULAR: S1, S2 normal. No murmurs, rubs, or gallops.  ABDOMEN: Soft, nontender, nondistended. Bowel sounds present.   EXTREMITIES: No pedal edema, cyanosis, or clubbing.  NEUROLOGIC: Awake, alert and oriented x3 sensation intact. Gait not checked.  PSYCHIATRIC: The patient is  alert and oriented x 3.  SKIN: No obvious rash, lesion, or ulcer.    LABORATORY PANEL:   CBC Recent Labs  Lab 06/21/18 0447  WBC 5.1  HGB 7.9*  HCT 25.3*  PLT 73*   ------------------------------------------------------------------------------------------------------------------  Chemistries  Recent Labs  Lab 06/17/18 1950  06/21/18 0447  NA 134*   < > 137  K 7.3*   < > 3.6  CL 114*   < > 101  CO2 <7*   < > 26  GLUCOSE 171*   < > 137*  BUN 186*   < > 34*  CREATININE 12.51*   < > 2.81*  CALCIUM 8.0*   < > 7.1*  MG  --    < > 2.4  AST 11*  --   --   ALT 16  --   --   ALKPHOS 96  --   --   BILITOT 0.6  --   --    < > = values in this interval not displayed.   ------------------------------------------------------------------------------------------------------------------  Cardiac Enzymes Recent Labs  Lab 06/17/18 1950  TROPONINI 0.03*   ------------------------------------------------------------------------------------------------------------------  RADIOLOGY:  No results found.  EKG:   Orders placed or performed during the hospital encounter of 06/17/18  . ED EKG  . EKG 12-Lead  . EKG 12-Lead  . ED EKG  . EKG 12-Lead  . EKG 12-Lead    ASSESSMENT AND PLAN:     This is a 67 year old female admitted for septic shock.  #Acute hypoxic respiratory failure Got intubated 06/18/2018 and extubated 06/19/2018 Transfer to floor 06/19/2018  #.  Septic shock:  Clinically improving, not in shock Urine culture with multiple species.  repeat urine culture from 06/19/2018 also with  multiple species IV Rocephin-changed to Keflex  # ESRD: Dialysis initiated emergently due to hyperkalemia. Hyperkalemia resolved now  Permacath placed 06/21/2018 Patient had dialysis yesterday and getting dialyzed today Discussed with nephrology and appreciate the recommendations Patient outpatient hemodialysis chair approved.  Plan is to discharge her tomorrow and patient has  to follow-up with outpatient hemodialysis center Scheduled for Monday, Thursday and Saturday next week(holiday schedule  #.  Hyperkalemia: Resolved with dialysis  #Hypomagnesemia replete and recheck . # Uremia: Contribute to encephalopathy on admission.  #Generalized weakness PT consult placed-Home health PT Disposition-Case management consulted regarding discharge planning as patient needs outpatient hemodialysis after discharge    DVT prophylaxis: Heparin   GI prophylaxis: PPI   All the records are reviewed and case discussed with Care Management/Social Workerr. Management plans discussed with the patient, son at bedside and they are in agreement.  CODE STATUS: fc   TOTAL TIME TAKING CARE OF THIS PATIENT:  36  minutes.   POSSIBLE D/C IN  1  DAYS, DEPENDING ON CLINICAL CONDITION.  Note: This dictation was prepared with Dragon dictation along with smaller phrase technology. Any transcriptional errors that result from this process are unintentional.   Nicholes Mango M.D on 06/21/2018 at 4:29 PM  Between 7am to 6pm - Pager - [phone] After 6pm go to [email or web address] - password [email or web address] [facility]  Office  [phone]  CC: Primary care physician; Mar Daring, PA-C

## 2018-06-22 LAB — CBC
HCT: 24.3 % — ABNORMAL LOW (ref 36.0–46.0)
Hemoglobin: 7.6 g/dL — ABNORMAL LOW (ref 12.0–15.0)
MCH: 27.8 pg (ref 26.0–34.0)
MCHC: 31.3 g/dL (ref 30.0–36.0)
MCV: 89 fL (ref 80.0–100.0)
Platelets: 72 10*3/uL — ABNORMAL LOW (ref 150–400)
RBC: 2.73 MIL/uL — ABNORMAL LOW (ref 3.87–5.11)
RDW: 13.8 % (ref 11.5–15.5)
WBC: 5.5 10*3/uL (ref 4.0–10.5)
nRBC: 0 % (ref 0.0–0.2)

## 2018-06-22 LAB — RENAL FUNCTION PANEL
Albumin: 2.9 g/dL — ABNORMAL LOW (ref 3.5–5.0)
Anion gap: 7 (ref 5–15)
BUN: 42 mg/dL — ABNORMAL HIGH (ref 8–23)
CO2: 28 mmol/L (ref 22–32)
Calcium: 7.2 mg/dL — ABNORMAL LOW (ref 8.9–10.3)
Chloride: 103 mmol/L (ref 98–111)
Creatinine, Ser: 3.04 mg/dL — ABNORMAL HIGH (ref 0.44–1.00)
GFR calc Af Amer: 18 mL/min — ABNORMAL LOW (ref >60)
GFR calc non Af Amer: 15 mL/min — ABNORMAL LOW (ref >60)
Glucose, Bld: 126 mg/dL — ABNORMAL HIGH (ref 70–99)
Phosphorus: 4.3 mg/dL (ref 2.5–4.6)
Potassium: 3.8 mmol/L (ref 3.5–5.1)
Sodium: 138 mmol/L (ref 135–145)

## 2018-06-22 LAB — GLUCOSE, CAPILLARY
Glucose-Capillary: 107 mg/dL — ABNORMAL HIGH (ref 70–99)
Glucose-Capillary: 109 mg/dL — ABNORMAL HIGH (ref 70–99)
Glucose-Capillary: 112 mg/dL — ABNORMAL HIGH (ref 70–99)
Glucose-Capillary: 122 mg/dL — ABNORMAL HIGH (ref 70–99)

## 2018-06-22 LAB — CULTURE, BLOOD (ROUTINE X 2)
Culture: NO GROWTH
Culture: NO GROWTH
Special Requests: ADEQUATE

## 2018-06-22 MED ORDER — PIOGLITAZONE HCL 15 MG PO TABS: 15.0000 mg | ORAL_TABLET | Freq: Every day | ORAL | 0 refills | Status: DC

## 2018-06-22 MED ORDER — RENA-VITE PO TABS: 1.0000 | ORAL_TABLET | Freq: Every day | ORAL | 0 refills | Status: DC

## 2018-06-22 MED ORDER — LISINOPRIL 20 MG PO TABS
40.0000 mg | ORAL_TABLET | Freq: Once | ORAL | Status: AC
  Administered 2018-06-22: 40 mg via ORAL
  Filled 2018-06-22: qty 2

## 2018-06-22 MED ORDER — ACETAMINOPHEN 325 MG PO TABS: 650.0000 mg | ORAL_TABLET | Freq: Four times a day (QID) | ORAL | Status: AC | PRN

## 2018-06-22 MED ORDER — NEPRO/CARBSTEADY PO LIQD: 237.0000 mL | Freq: Two times a day (BID) | ORAL | 0 refills | Status: DC

## 2018-06-22 MED ORDER — PIOGLITAZONE HCL 15 MG PO TABS
15.0000 mg | ORAL_TABLET | Freq: Every day | ORAL | Status: DC
  Administered 2018-06-22: 15 mg via ORAL
  Filled 2018-06-22: qty 1

## 2018-06-22 MED ORDER — AMLODIPINE BESYLATE 2.5 MG PO TABS: 2.5000 mg | ORAL_TABLET | Freq: Every day | ORAL | 0 refills | Status: DC

## 2018-06-22 NOTE — Progress Notes (Signed)
[facility], [place] 06/22/18  Subjective:   Patient appears to be stronger and doing better.  She underwent left IJ PermCath placement .  Able to eat without nausea or vomiting.  Serum creatinine is higher today at 3.04. Reports constipation Patient told hospitalist team that she was taking metformin as she was unable to afford Januvia.  Objective:  Vital signs in last 24 hours:  Temp:  [98 F (36.7 C)-98.4 F (36.9 C)] 98.1 F (36.7 C) (12/21 0441) Pulse Rate:  [61-76] 70 (12/21 0441) Resp:  [14-20] 20 (12/21 0441) BP: (127-146)/(78-107) 146/78 (12/21 0441) SpO2:  [94 %-99 %] 98 % (12/21 0441) Weight:  [104.9 kg] 104.9 kg (12/21 0500)  Weight change:  Filed Weights   06/20/18 0500 06/21/18 0950 06/22/18 0500  Weight: 104.6 kg 104.6 kg 104.9 kg    Intake/Output:    Intake/Output Summary (Last 24 hours) at 06/22/2018 1023 Last data filed at 06/22/2018 0441 Gross per 24 hour  Intake 840 ml  Output 800 ml  Net 40 ml     Physical Exam: General:  No acute distress, lying in the bed  HEENT  moist oral mucous membranes  Neck  no masses  Pulm/lungs  clear to auscultation bilaterally,   CVS/Heart  regular  Abdomen:   Soft, nontender  Extremities:  Trace edema  Neurologic:  Alert, able to follow commands  Skin:  No acute rashes  Access:  left IJ tunneled catheter       Basic Metabolic Panel:  Recent Labs  Lab 06/17/18 2205 06/18/18 0233 06/18/18 0505 06/19/18 0718 06/21/18 0447 06/22/18 0622  NA 135 139 140 139 137 138  K 7.0* 6.5* 3.6 3.3* 3.6 3.8  CL 119* 121* 114* 108 101 103  CO2 <7* <7*  --  21* 26 28  GLUCOSE 249* 144* 115* 130* 137* 126*  BUN 172* 152* 114* 74* 34* 42*  CREATININE 11.54* 11.57* 7.90* 5.72* 2.81* 3.04*  CALCIUM 7.1* 7.1*  --  6.8* 7.1* 7.2*  MG  --   --   --  1.6* 2.4  --   PHOS  --  9.3*  9.3*  --  6.1*  --  4.3     CBC: Recent Labs  Lab 06/17/18 1950 06/17/18 1957 06/18/18 0505 06/21/18 0447  06/22/18 0622  WBC 15.3*  --   --  5.1 5.5  NEUTROABS 10.8*  --   --   --   --   HGB 10.5* 11.2* 9.2* 7.9* 7.6*  HCT 34.0* 33.0* 27.0* 25.3* 24.3*  MCV 89.9  --   --  88.5 89.0  PLT 143*  --   --  73* 72*      Lab Results  Component Value Date   HEPBSAG Negative 06/18/2018   HEPBSAB Non Reactive 06/18/2018      Microbiology:  Recent Results (from the past 240 hour(s))  Urine culture     Status: Abnormal   Collection Time: 06/17/18  7:46 PM  Result Value Ref Range Status   Specimen Description   Final    URINE, RANDOM Performed at [facility], [address], [place], [place] [address]    Special Requests   Final    NONE Performed at [facility], [address] [place],  [address]    Culture MULTIPLE SPECIES PRESENT, SUGGEST RECOLLECTION (A)  Final   Report Status 06/19/2018 FINAL  Final  Culture, blood (routine x 2)     Status: None  Collection Time: 06/17/18  7:50 PM  Result Value Ref Range Status   Specimen Description BLOOD BLOOD RIGHT HAND  Final   Special Requests   Final    BOTTLES DRAWN AEROBIC AND ANAEROBIC Blood Culture results may not be optimal due to an excessive volume of blood received in culture bottles   Culture   Final    NO GROWTH 5 DAYS Performed at [facility], [address] [place], [place] [address]    Report Status 06/22/2018 FINAL  Final  Culture, blood (routine x 2)     Status: None   Collection Time: 06/17/18  7:53 PM  Result Value Ref Range Status   Specimen Description BLOOD LEFT ANTECUBITAL  Final   Special Requests   Final    BOTTLES DRAWN AEROBIC AND ANAEROBIC Blood Culture adequate volume   Culture   Final    NO GROWTH 5 DAYS Performed at [facility], [address] [place], [place] [address]    Report Status 06/22/2018 FINAL  Final  MRSA PCR Screening     Status: None   Collection Time: 06/18/18  9:03 AM  Result Value Ref Range Status   MRSA by PCR NEGATIVE NEGATIVE  Final    Comment:        The GeneXpert MRSA Assay (FDA approved for NASAL specimens only), is one component of a comprehensive MRSA colonization surveillance program. It is not intended to diagnose MRSA infection nor to guide or monitor treatment for MRSA infections. Performed at [facility], [address] [place], [place] [address]   Urine Culture     Status: Abnormal   Collection Time: 06/19/18  1:56 PM  Result Value Ref Range Status   Specimen Description   Final    URINE, RANDOM Performed at [facility], [address] [place], [place] [address]    Special Requests   Final    NONE Performed at [facility], [address] [place], [place] [address]    Culture MULTIPLE SPECIES PRESENT, SUGGEST RECOLLECTION (A)  Final   Report Status 06/21/2018 FINAL  Final    Coagulation Studies: No results for input(s): LABPROT, INR in the last 72 hours.  Urinalysis: Recent Labs    06/21/18 1745  COLORURINE YELLOW*  LABSPEC 1.011  PHURINE 5.0  GLUCOSEU NEGATIVE  HGBUR SMALL*  BILIRUBINUR NEGATIVE  KETONESUR NEGATIVE  PROTEINUR 100*  NITRITE NEGATIVE  LEUKOCYTESUR LARGE*      Imaging: No results found.   Medications:   . cefTRIAXone (ROCEPHIN)  IV Stopped (06/22/18 1003)   . amLODipine  2.5 mg Oral Daily  . chlorhexidine gluconate (MEDLINE KIT)  15 mL Mouth Rinse BID  . Chlorhexidine Gluconate Cloth  6 each Topical Q0600  . feeding supplement (NEPRO CARB STEADY)  237 mL Oral BID BM  . insulin aspart  0-15 Units Subcutaneous Q4H  . multivitamin  1 tablet Oral QHS  . pioglitazone  15 mg Oral Daily  . pravastatin  20 mg Oral q1800   acetaminophen **OR** acetaminophen, ondansetron **OR** ondansetron (ZOFRAN) IV, oxyCODONE  Assessment/ Plan:  67 y.o.caucasian female with diabetes, hypertension, kidney stones, chronic kidney disease stage IV, was admitted on 06/17/2018 with acute renal failure, severe acidosis, hyperkalemia,  generalized weakness and decreased responsiveness.   1. Acute renal failure on chronic kidney disease stage IV 2. Severe Hyperkalemia, now hypokalemia 3. Severe Acidosis 4. Urinary tract infection 5. Acute respiratory failure 6. DM-2 with CKD. Last HbA1c 6.8% (04/2018)  Baseline creatinine  2.13/GFR 24 from October 17, 2017 Ultrasound shows bilateral cortical thinning as well as nephrolithiasis.  Not sure if patient has progressed to ESRD.  Plan for dialysis today.  Patient will then go to outpatient dialysis on Monday and subsequently on Thursday and Saturday.  We will follow-up with her as outpatient also. Consider Actos for blood sugar management.  Discontinue metformin.   LOS: [place] 12/21/201910:23 AM  [place], [place]  Note: This note was prepared with Dragon dictation. Any transcription errors are unintentional

## 2018-06-22 NOTE — Discharge Summary (Addendum)
[facility] NAME: Debra Paul    MR#:  [id number]  DATE OF BIRTH:  05/11/1951  DATE OF ADMISSION:  06/17/2018 ADMITTING PHYSICIAN: Harrie Foreman, MD  DATE OF DISCHARGE:  06/22/18  PRIMARY CARE PHYSICIAN: Mar Daring, PA-C    ADMISSION DIAGNOSIS:  Hyperkalemia [E87.5] Encounter for central line placement [Z45.2] Acute kidney injury ([place]) [N17.9] Urinary tract infection with hematuria, site unspecified [N39.0, R31.9] Altered mental status, unspecified altered mental status type [R41.82]  DISCHARGE DIAGNOSIS:    SECONDARY DIAGNOSIS:   Past Medical History:  Diagnosis Date  . Diabetes mellitus without complication ([place])   . GERD (gastroesophageal reflux disease)   . Hypercholesteremia   . Kidney stones    Takes lisinopril for kidney protection  . Parathyroid disorder ([place])   . Sleep apnea    does not use CPAP  . Vertigo    in past    HOSPITAL COURSE:  HPI: Patient with past medical history of diabetes, chronic kidney disease and sleep apnea presents to the emergency department following a weeklong of general malaise and diarrhea.  The patient was reactive and alert yesterday but was found to be listless and barely communicative by her son this evening.  Laboratory evaluation upon presentation significant for potassium greater than 7 and creatinine greater than 15.  Vascular surgery was consulted and placed a dialysis catheter.  Hemodialysis was initiated in the emergency department.  Her vitals are stable ABG was obtained due to her mental status which showed pH 7.05, PCO2 of less than 19 with base deficit of 24.  She was intubated and placed on mechanical ventilation prior to emergency department staff called the hospitalist service for admission.  Hospital course #Acute hypoxic respiratory failure Got intubated 06/18/2018 and extubated 06/19/2018 Transfer to floor 06/19/2018  #. Septic  shock:  Resolved, not in shock Urine culture with multiple species.  repeat urine culture from 06/19/2018 also with multiple species IV Rocephin-changed to Keflex, completed antibiotic course during the hospital stay.  No need of antibiotics at the time of discharge  #ESRD: Dialysis initiated emergently due to hyperkalemia. Hyperkalemia resolved now Permacath placed 06/21/2018 Patient had dialysis during the hospital course ,discussed with nephrology and appreciate the recommendations Patient outpatient hemodialysis chair approved.  Plan is to discharge her today after hemodialysis and patient has to follow-up with outpatient hemodialysis center Scheduled for Monday, Thursday and Saturday next week(holiday schedule  #. Hyperkalemia: Resolved with dialysis  #Hypomagnesemia replete and recheck . #Uremia: Contribute to encephalopathy on admission.  On hemodialysis  #Essential hypertension blood pressure is okay resume amlodipine today nightly and hold lisinopril for now  #Generalized weakness PT consult placed-op PT  #Severe constipation-resolved patient just had a bowel movement  Disposition-Case management is following, outpatient PT and medication assistance if needed DISCHARGE CONDITIONS:  STABLE  CONSULTS OBTAINED:     PROCEDURES  HD Perm -a-cath, HD   DRUG ALLERGIES:   Allergies  Allergen Reactions  . Prilosec [Omeprazole] Other (See Comments)    Reaction: unknown  . Ciprofloxacin Rash    DISCHARGE MEDICATIONS:   Allergies as of 06/22/2018      Reactions   Prilosec [omeprazole] Other (See Comments)   Reaction: unknown   Ciprofloxacin Rash      Medication List    STOP taking these medications   JANUVIA 50 MG tablet Generic drug:  sitaGLIPtin   metFORMIN 1000 MG tablet Commonly known as:  GLUCOPHAGE   potassium citrate  10 MEQ (1080 MG) SR tablet Commonly known as:  UROCIT-K   triamcinolone cream 0.1 % Commonly known as:  KENALOG     TAKE  these medications   acetaminophen 325 MG tablet Commonly known as:  TYLENOL Take 2 tablets (650 mg total) by mouth every 6 (six) hours as needed for mild pain (or Fever >/= 101).   amLODipine 2.5 MG tablet Commonly known as:  NORVASC Take 1 tablet (2.5 mg total) by mouth at bedtime. What changed:  when to take this   feeding supplement (NEPRO CARB STEADY) Liqd Take 237 mLs by mouth 2 (two) times daily between meals.   ferrous sulfate 325 (65 FE) MG tablet Take 325 mg by mouth daily.   lisinopril 20 MG tablet Commonly known as:  PRINIVIL,ZESTRIL TAKE 1 TABLET BY MOUTH  DAILY   multivitamin Tabs tablet Take 1 tablet by mouth at bedtime.   pioglitazone 15 MG tablet Commonly known as:  ACTOS Take 1 tablet (15 mg total) by mouth daily.   pravastatin 20 MG tablet Commonly known as:  PRAVACHOL TAKE 1 TABLET BY MOUTH AT  BEDTIME            Durable Medical Equipment  (From admission, onward)         Start     Ordered   06/22/18 1045  For home use only DME Cane  Once     06/22/18 1044           DISCHARGE INSTRUCTIONS:  Follow-up with primary care physician in 3 to 5 days Follow-up with outpatient hemodialysis center Monday, Thursday Saturday holiday schedule next week Follow-up with nephrology Dr. Candiss Norse in 5 to 7 days   DIET:  Renal diet  DISCHARGE CONDITION:  Fair  ACTIVITY:  Activity as tolerated  OXYGEN:  Home Oxygen: No.   Oxygen Delivery: room air  DISCHARGE LOCATION:  home   If you experience worsening of your admission symptoms, develop shortness of breath, life threatening emergency, suicidal or homicidal thoughts you must seek medical attention immediately by calling 911 or calling your MD immediately  if symptoms less severe.  You Must read complete instructions/literature along with all the possible adverse reactions/side effects for all the Medicines you take and that have been prescribed to you. Take any new Medicines after you have  completely understood and accpet all the possible adverse reactions/side effects.   Please note  You were cared for by a hospitalist during your hospital stay. If you have any questions about your discharge medications or the care you received while you were in the hospital after you are discharged, you can call the unit and asked to speak with the hospitalist on call if the hospitalist that took care of you is not available. Once you are discharged, your primary care physician will handle any further medical issues. Please note that NO REFILLS for any discharge medications will be authorized once you are discharged, as it is imperative that you return to your primary care physician (or establish a relationship with a primary care physician if you do not have one) for your aftercare needs so that they can reassess your need for medications and monitor your lab values.     Today  Chief Complaint  Patient presents with  . Code Sepsis   Patient is doing much better today.  Will be discharged after hemodialysis.  No bowel movement for several days will give her suppository and if needed enema prior to discharge  ROS:  CONSTITUTIONAL:  Denies fevers, chills. Denies any fatigue, weakness.  EYES: Denies blurry vision, double vision, eye pain. EARS, NOSE, THROAT: Denies tinnitus, ear pain, hearing loss. RESPIRATORY: Denies cough, wheeze, shortness of breath.  CARDIOVASCULAR: Denies chest pain, palpitations, edema.  GASTROINTESTINAL: Denies nausea, vomiting, diarrhea, abdominal pain. Denies bright red blood per rectum. GENITOURINARY: Denies dysuria, hematuria. ENDOCRINE: Denies nocturia or thyroid problems. HEMATOLOGIC AND LYMPHATIC: Denies easy bruising or bleeding. SKIN: Denies rash or lesion. MUSCULOSKELETAL: Denies pain in neck, back, shoulder, knees, hips or arthritic symptoms.  NEUROLOGIC: Denies paralysis, paresthesias.  PSYCHIATRIC: Denies anxiety or depressive symptoms.   VITAL SIGNS:   Blood pressure (!) 173/90, pulse 72, temperature 98.1 F (36.7 C), temperature source Oral, resp. rate 16, height 5\' 9"  (1.753 m), weight 104.9 kg, SpO2 100 %.  I/O:    Intake/Output Summary (Last 24 hours) at 06/22/2018 1756 Last data filed at 06/22/2018 1430 Gross per 24 hour  Intake 0 ml  Output 700 ml  Net -700 ml    PHYSICAL EXAMINATION:  GENERAL:  67 y.o.-year-old patient lying in the bed with no acute distress.  EYES: Pupils equal, round, reactive to light and accommodation. No scleral icterus. Extraocular muscles intact.  HEENT: Head atraumatic, normocephalic. Oropharynx and nasopharynx clear.  NECK:  Supple, no jugular venous distention. No thyroid enlargement, no tenderness.  LUNGS: Normal breath sounds bilaterally, no wheezing, rales,rhonchi or crepitation. No use of accessory muscles of respiration.  CARDIOVASCULAR: S1, S2 normal. No murmurs, rubs, or gallops.  ABDOMEN: Soft, non-tender, non-distended. Bowel sounds present.  EXTREMITIES: No pedal edema, cyanosis, or clubbing.  NEUROLOGIC: Awake, alert and oriented x3 sensation intact. Gait not checked.  PSYCHIATRIC: The patient is alert and oriented x 3.  SKIN: No obvious rash, lesion, or ulcer.   DATA REVIEW:   CBC Recent Labs  Lab 06/22/18 0622  WBC 5.5  HGB 7.6*  HCT 24.3*  PLT 72*    Chemistries  Recent Labs  Lab 06/17/18 1950  06/21/18 0447 06/22/18 0622  NA 134*   < > 137 138  K 7.3*   < > 3.6 3.8  CL 114*   < > 101 103  CO2 <7*   < > 26 28  GLUCOSE 171*   < > 137* 126*  BUN 186*   < > 34* 42*  CREATININE 12.51*   < > 2.81* 3.04*  CALCIUM 8.0*   < > 7.1* 7.2*  MG  --    < > 2.4  --   AST 11*  --   --   --   ALT 16  --   --   --   ALKPHOS 96  --   --   --   BILITOT 0.6  --   --   --    < > = values in this interval not displayed.    Cardiac Enzymes Recent Labs  Lab 06/17/18 1950  TROPONINI 0.03*    Microbiology Results  Results for orders placed or performed during the hospital  encounter of 06/17/18  Urine culture     Status: Abnormal   Collection Time: 06/17/18  7:46 PM  Result Value Ref Range Status   Specimen Description   Final    URINE, RANDOM Performed at [facility], [address], [place], [place] [address]    Special Requests   Final    NONE Performed at [facility], [address] [place], [place] [address]    Culture MULTIPLE SPECIES PRESENT, SUGGEST RECOLLECTION (A)  Final   Report Status 06/19/2018 FINAL  Final  Culture, blood (routine x 2)     Status: None   Collection Time: 06/17/18  7:50 PM  Result Value Ref Range Status   Specimen Description BLOOD BLOOD RIGHT HAND  Final   Special Requests   Final    BOTTLES DRAWN AEROBIC AND ANAEROBIC Blood Culture results may not be optimal due to an excessive volume of blood received in culture bottles   Culture   Final    NO GROWTH 5 DAYS Performed at [facility], [address], [place], [place] [address]    Report Status 06/22/2018 FINAL  Final  Culture, blood (routine x 2)     Status: None   Collection Time: 06/17/18  7:53 PM  Result Value Ref Range Status   Specimen Description BLOOD LEFT ANTECUBITAL  Final   Special Requests   Final    BOTTLES DRAWN AEROBIC AND ANAEROBIC Blood Culture adequate volume   Culture   Final    NO GROWTH 5 DAYS Performed at [facility], [address] [place], [place] [address]    Report Status 06/22/2018 FINAL  Final  MRSA PCR Screening     Status: None   Collection Time: 06/18/18  9:03 AM  Result Value Ref Range Status   MRSA by PCR NEGATIVE NEGATIVE Final    Comment:        The GeneXpert MRSA Assay (FDA approved for NASAL specimens only), is one component of a comprehensive MRSA colonization surveillance program. It is not intended to diagnose MRSA infection nor to guide or monitor treatment for MRSA infections. Performed at [facility], [address], [place], [place] [address]   Urine  Culture     Status: Abnormal   Collection Time: 06/19/18  1:56 PM  Result Value Ref Range Status   Specimen Description   Final    URINE, RANDOM Performed at [facility], [address] [place], [place] [address]    Special Requests   Final    NONE Performed at [facility], [address] [place], [place] [address]    Culture MULTIPLE SPECIES PRESENT, SUGGEST RECOLLECTION (A)  Final   Report Status 06/21/2018 FINAL  Final    RADIOLOGY:  No results found.  EKG:   Orders placed or performed during the hospital encounter of 06/17/18  . ED EKG  . EKG 12-Lead  . EKG 12-Lead  . ED EKG  . EKG 12-Lead  . EKG 12-Lead      Management plans discussed with the patient, mom at bedside and they are in agreement.  CODE STATUS:     Code Status Orders  (From admission, onward)         Start     Ordered   06/18/18 0824  Full code  Continuous     06/18/18 0823        Code Status History    This patient has a current code status but no historical code status.      TOTAL TIME TAKING CARE OF THIS PATIENT: 45  minutes.   Note: This dictation was prepared with Dragon dictation along with smaller phrase technology. Any transcriptional errors that result from this process are unintentional.   @[facility] @  on 06/22/2018 at 5:56 PM  Between 7am to 6pm - Pager - [phone]  After 6pm go to [email or web address] - password [email or web address] [facility]  Office  [phone]  CC: Primary care physician; Marlyn Corporal,  Clearnce Sorrel, PA-C

## 2018-06-22 NOTE — Progress Notes (Signed)
This note also relates to the following rows which could not be included: Resp - Cannot attach notes to unvalidated device data  Hd started

## 2018-06-22 NOTE — Care Management Note (Signed)
Case Management Note  Patient Details  Name: Debra Paul MRN: [id number] Date of Birth: 04-14-1951  Subjective/Objective:  Patient discharging today. Patient agreeable to outpatient therapy, referral form faxed to rehab department. DME cane ordered and delivered. Dialysis scheduled established and patient is aware of her schedule                    Action/Plan:   Expected Discharge Date:  06/22/18               Expected Discharge Plan:  [place]  In-House Referral:     Discharge planning Services  CM Consult  Post Acute Care Choice:  Durable Medical Equipment, Home Health Choice offered to:  Patient  DME Arranged:  Kasandra Knudsen DME Agency:  [place]:    [facility] Agency:     Status of Service:  Completed, signed off  If discussed at [place] of Stay Meetings, dates discussed:    Additional Comments:  Latanya Maudlin, RN 06/22/2018, 10:46 AM

## 2018-06-22 NOTE — Discharge Instructions (Signed)
Follow-up with primary care physician in 3 to 5 days Follow-up with outpatient hemodialysis center Monday, Thursday Saturday holiday schedule next week Follow-up with nephrology Dr. Candiss Norse in 5 to 7 days

## 2018-06-22 NOTE — Progress Notes (Signed)
MD made aware that pt.'s BP after dialysis and after the administration of her daily norvasc was 173/93. Verbal order to give a one time dose of lisinopril 40 mg PO prior to discharged.   Byanca Kasper [employer]

## 2018-06-22 NOTE — Progress Notes (Signed)
This note also relates to the following rows which could not be included: Pulse Rate - Cannot attach notes to unvalidated device data Resp - Cannot attach notes to unvalidated device data BP - Cannot attach notes to unvalidated device data  Hd completed  

## 2018-06-22 NOTE — Progress Notes (Signed)
Patient discharge teaching given, including activity, diet, follow-up appoints, and medications. Patient verbalized understanding of all discharge instructions. IV access was d/c'd. Vitals are stable. Skin is intact except as charted in most recent assessments. MD ok pt.'s BP is 161/81 for discharge. Pt is asymptomatic. Pt to be escorted out by NT, to be driven home by family.  Ellanie Oppedisano [employer]

## 2018-06-24 ENCOUNTER — Telehealth: Payer: Self-pay

## 2018-06-24 ENCOUNTER — Encounter: Payer: Self-pay | Admitting: Vascular Surgery

## 2018-06-24 NOTE — Telephone Encounter (Signed)
Transition Care Management Follow-up Telephone Call  Date of discharge and from where: [facility] on 06/22/18.  How have you been since you were released from the hospital? Doing good, appetite is better and she is having no issues going to the bathroom. Declined fever, pain or n/v/d.  Any questions or concerns? No   Items Reviewed:  Did the pt receive and understand the discharge instructions provided? Yes   Medications obtained and verified? Yes   Any new allergies since your discharge? No   Dietary orders reviewed? Yes  Do you have support at home? Yes   Other (ie: DME, Home Health, etc) N/A  Functional Questionnaire: (I = Independent and D = Dependent)  Bathing/Dressing- I   Meal Prep- I  Eating- I  Maintaining continence- I  Transferring/Ambulation- I, has a cane and walker if needed.  Managing Meds- I   Follow up appointments reviewed:    PCP Hospital f/u appt confirmed? No , pt to CB today to schedule apt.  [facility] f/u appt confirmed? Yes   Are transportation arrangements needed? No   If their condition worsens, is the pt aware to call  their PCP or go to the ED? Yes  Was the patient provided with contact information for the PCP's office or ED? Yes  Was the pt encouraged to call back with questions or concerns? Yes

## 2018-06-28 ENCOUNTER — Ambulatory Visit: Payer: Medicare Other | Admitting: Physician Assistant

## 2018-06-28 ENCOUNTER — Encounter: Payer: Self-pay | Admitting: Physician Assistant

## 2018-06-28 ENCOUNTER — Telehealth: Payer: Self-pay

## 2018-06-28 VITALS — BP 152/93 | HR 96 | Temp 98.1°F | Resp 16 | Wt 226.0 lb

## 2018-06-28 DIAGNOSIS — I10 Essential (primary) hypertension: Secondary | ICD-10-CM | POA: Diagnosis not present

## 2018-06-28 DIAGNOSIS — Z9189 Other specified personal risk factors, not elsewhere classified: Secondary | ICD-10-CM | POA: Diagnosis not present

## 2018-06-28 DIAGNOSIS — N179 Acute kidney failure, unspecified: Secondary | ICD-10-CM | POA: Diagnosis not present

## 2018-06-28 DIAGNOSIS — Z8619 Personal history of other infectious and parasitic diseases: Secondary | ICD-10-CM | POA: Diagnosis not present

## 2018-06-28 DIAGNOSIS — IMO0001 Reserved for inherently not codable concepts without codable children: Secondary | ICD-10-CM

## 2018-06-28 DIAGNOSIS — E1121 Type 2 diabetes mellitus with diabetic nephropathy: Secondary | ICD-10-CM

## 2018-06-28 DIAGNOSIS — N185 Chronic kidney disease, stage 5: Secondary | ICD-10-CM

## 2018-06-28 NOTE — Progress Notes (Signed)
Patient: Debra Paul Female    DOB: 1951/01/23   67 y.o.   MRN: [id number] Visit Date: 06/28/2018  Today's Provider: Mar Daring, PA-C   Chief Complaint  Patient presents with  . Follow-up   Subjective:     HPI   Follow up Hospitalization  Patient was admitted to [facility] on 06/17/18 and discharged on 06/22/18 She was treated for Acute Hypoxic Respiratory failure, got intubated 12/17 and extubated 12/18 -UCX-with multiple species treated with IV Rocephin-changed to Keflex during hospital stayed. -Dialysis-During hospital course. Patient is scheduled at the outpatient hemodialysis center Monday, Thursday, Saturday. Telephone follow up was done on 06/24/18 She reports excellent compliance with treatment. She reports this condition is Improved. ------------------------------------------------------------------------------------   Allergies  Allergen Reactions  . Prilosec [Omeprazole] Other (See Comments)    Reaction: unknown  . Ciprofloxacin Rash     Current Outpatient Medications:  .  acetaminophen (TYLENOL) 325 MG tablet, Take 2 tablets (650 mg total) by mouth every 6 (six) hours as needed for mild pain (or Fever >/= 101)., Disp: , Rfl:  .  amLODipine (NORVASC) 2.5 MG tablet, Take 1 tablet (2.5 mg total) by mouth at bedtime., Disp: 30 tablet, Rfl: 0 .  ferrous sulfate 325 (65 FE) MG tablet, Take 325 mg by mouth daily. , Disp: , Rfl:  .  multivitamin (RENA-VIT) TABS tablet, Take 1 tablet by mouth at bedtime., Disp: 30 tablet, Rfl: 0 .  Nutritional Supplements (FEEDING SUPPLEMENT, NEPRO CARB STEADY,) LIQD, Take 237 mLs by mouth 2 (two) times daily between meals., Disp: 60 Can, Rfl: 0 .  pioglitazone (ACTOS) 15 MG tablet, Take 1 tablet (15 mg total) by mouth daily., Disp: 30 tablet, Rfl: 0 .  pravastatin (PRAVACHOL) 20 MG tablet, TAKE 1 TABLET BY MOUTH AT  BEDTIME, Disp: 90 tablet, Rfl: 1  Review of Systems  Constitutional: Negative.   Respiratory:  Negative.   Cardiovascular: Negative.   Endocrine: Negative.   Neurological: Negative.   Psychiatric/Behavioral: Negative.     Social History   Tobacco Use  . Smoking status: Never Smoker  . Smokeless tobacco: Never Used  Substance Use Topics  . Alcohol use: No    Comment: 2-3times a year      Objective:   BP (!) 152/93 (BP Location: Left Arm, Patient Position: Sitting, Cuff Size: Normal)   Pulse 96   Temp 98.1 F (36.7 C) (Oral)   Resp 16   Wt 226 lb (102.5 kg)   SpO2 95%   BMI 33.37 kg/m  Vitals:   06/28/18 1553  BP: (!) 152/93  Pulse: 96  Resp: 16  Temp: 98.1 F (36.7 C)  TempSrc: Oral  SpO2: 95%  Weight: 226 lb (102.5 kg)     Physical Exam Vitals signs reviewed.  Constitutional:      General: She is not in acute distress.    Appearance: She is well-developed. She is not diaphoretic.  Neck:     Musculoskeletal: Normal range of motion and neck supple.     Thyroid: No thyromegaly.     Vascular: No JVD.     Trachea: No tracheal deviation.  Cardiovascular:     Rate and Rhythm: Normal rate and regular rhythm.     Heart sounds: Normal heart sounds. No murmur. No friction rub. No gallop.   Pulmonary:     Effort: Pulmonary effort is normal. No respiratory distress.     Breath sounds: Normal breath sounds. No wheezing or rales.  Chest:    Musculoskeletal:     Right lower leg: No edema.     Left lower leg: No edema.  Lymphadenopathy:     Cervical: No cervical adenopathy.        Assessment & Plan    1. Transition of care performed with sharing of clinical summary Hospital H&P, discharge summary, consult notes, imaging and labs all reviewed from hospitalization during 06/17/18-06/22/18.  2. Acute renal failure superimposed on stage 5 chronic kidney disease, not on chronic dialysis, unspecified acute renal failure type ([place]) Had hyperkalemia as well. Currently doing well. Has temporary dialysis on T[place]Th[place]. Saturday 06/29/18 is her last scheduled  dialysis day. She is urinating well in between. Trying to keep herself from being dehydrated but is following fluid restrictions. Has f/u scheduled with Dr. Candiss Norse, Nephrology, on 07/04/17. Advised to call or go to hospital if she gains more than 5 pounds in a day during the week off dialysis. She will return in 2-3 weeks for recheck. It is suspected Dr. Candiss Norse will be checking labs. If they are not checked I will get at her f/u.  3. History of sepsis Suspected from UTI. Patient unable to tell when she gets UTIs. Discussed importance of being checked if she or her sister notice any symptoms or changes in behavior.   4. Essential (primary) hypertension Stable. Continue amlodipine 2.5mg . Continue to hold lisinopril.   5. Controlled type 2 diabetes mellitus with diabetic nephropathy, without long-term current use of insulin ([place]) Doing well. Metformin and Januvia stopped. Continue pioglitazone. Will recheck sugar at return visit. If increasing may consider adding tradjenta.      Mar Daring, PA-C  [facility]

## 2018-06-28 NOTE — Telephone Encounter (Signed)
EMMI Follow-up: Noted on the report that the patient had some questions about discharge papers and had unfilled Rx's.  I called and left Debra Paul my contact information for her to call me at her convenience.

## 2018-07-01 ENCOUNTER — Telehealth: Payer: Self-pay | Admitting: Physician Assistant

## 2018-07-01 NOTE — Telephone Encounter (Signed)
Pt was recently put on a nutrition drink.  Pt has diarrhea that has gone for black to brown and having some consistency. Pt wanting to know what she can eat or take to help with making bm's solid.  Please advise.  Thanks, [employer]

## 2018-07-01 NOTE — Telephone Encounter (Signed)
Called pt and advised instructions per Dr Caryn Section.  dbs

## 2018-07-01 NOTE — Telephone Encounter (Signed)
Avoid dairy products and fruit juices. Can take OTC imodium as needed for diarrhea.

## 2018-07-07 ENCOUNTER — Encounter: Payer: Self-pay | Admitting: Physician Assistant

## 2018-07-15 ENCOUNTER — Telehealth (INDEPENDENT_AMBULATORY_CARE_PROVIDER_SITE_OTHER): Payer: Self-pay

## 2018-07-15 ENCOUNTER — Telehealth: Payer: Self-pay | Admitting: Physician Assistant

## 2018-07-15 DIAGNOSIS — E119 Type 2 diabetes mellitus without complications: Secondary | ICD-10-CM

## 2018-07-15 MED ORDER — RENA-VITE PO TABS: 1.0000 | ORAL_TABLET | Freq: Every day | ORAL | 1 refills | Status: DC

## 2018-07-15 MED ORDER — PIOGLITAZONE HCL 15 MG PO TABS: 15.0000 mg | ORAL_TABLET | Freq: Every day | ORAL | 1 refills | Status: DC

## 2018-07-15 NOTE — Telephone Encounter (Signed)
Spoke with the patient and gave her the day and arrival time for her procedure with Dr. Lucky Cowboy at the [facility]. Patient is to arrive at 10:30 on 07/18/2018.

## 2018-07-15 NOTE — Telephone Encounter (Signed)
Please advise 

## 2018-07-15 NOTE — Telephone Encounter (Signed)
Patient needs to know if she needs to continue these as she was started on them in the hospital by Dr. Merita Norton.   Actos and Rena-vite.  If she needs to continue these, please call them into [facility].mail order

## 2018-07-15 NOTE — Telephone Encounter (Signed)
Patient was advised.  

## 2018-07-15 NOTE — Telephone Encounter (Signed)
Refilled for her 

## 2018-07-17 ENCOUNTER — Other Ambulatory Visit (INDEPENDENT_AMBULATORY_CARE_PROVIDER_SITE_OTHER): Payer: Self-pay | Admitting: Vascular Surgery

## 2018-07-18 ENCOUNTER — Encounter: Payer: Self-pay | Admitting: Vascular Surgery

## 2018-07-18 ENCOUNTER — Encounter
Admission: RE | Disposition: A | Discharge status: Home / Self Care | Payer: Self-pay | Source: Home / Self Care | Attending: Vascular Surgery

## 2018-07-18 ENCOUNTER — Ambulatory Visit
Admission: RE | Admit: 2018-07-18 | Discharge: 2018-07-18 | Disposition: A | Discharge status: Home / Self Care | Payer: Medicare Other | Attending: Vascular Surgery | Admitting: Vascular Surgery

## 2018-07-18 DIAGNOSIS — Z881 Allergy status to other antibiotic agents status: Secondary | ICD-10-CM | POA: Insufficient documentation

## 2018-07-18 DIAGNOSIS — I129 Hypertensive chronic kidney disease with stage 1 through stage 4 chronic kidney disease, or unspecified chronic kidney disease: Secondary | ICD-10-CM

## 2018-07-18 DIAGNOSIS — N189 Chronic kidney disease, unspecified: Secondary | ICD-10-CM

## 2018-07-18 DIAGNOSIS — I120 Hypertensive chronic kidney disease with stage 5 chronic kidney disease or end stage renal disease: Secondary | ICD-10-CM | POA: Diagnosis not present

## 2018-07-18 DIAGNOSIS — N186 End stage renal disease: Secondary | ICD-10-CM | POA: Diagnosis not present

## 2018-07-18 DIAGNOSIS — Z8249 Family history of ischemic heart disease and other diseases of the circulatory system: Secondary | ICD-10-CM | POA: Diagnosis not present

## 2018-07-18 DIAGNOSIS — T829XXA Unspecified complication of cardiac and vascular prosthetic device, implant and graft, initial encounter: Secondary | ICD-10-CM | POA: Diagnosis not present

## 2018-07-18 DIAGNOSIS — E1122 Type 2 diabetes mellitus with diabetic chronic kidney disease: Secondary | ICD-10-CM | POA: Insufficient documentation

## 2018-07-18 HISTORY — PX: DIALYSIS/PERMA CATHETER REMOVAL: CATH118289

## 2018-07-18 SURGERY — DIALYSIS/PERMA CATHETER REMOVAL
Anesthesia: LOCAL

## 2018-07-18 SURGICAL SUPPLY — 5 items
DERMABOND ADVANCED (GAUZE/BANDAGES/DRESSINGS) ×1
DERMABOND ADVANCED .7 DNX12 (GAUZE/BANDAGES/DRESSINGS) IMPLANT
FORCEPS HALSTEAD CVD 5IN STRL (INSTRUMENTS) ×1 IMPLANT
SUT MNCRL AB 4-0 PS2 18 (SUTURE) ×1 IMPLANT
TRAY LACERAT/PLASTIC (MISCELLANEOUS) ×1 IMPLANT

## 2018-07-18 NOTE — Op Note (Addendum)
Operative Note     Preoperative diagnosis:   1. CKD, no longer needing dialysis  Postoperative diagnosis:  1. Same as above  Procedure:  Removal of left jugular Permcath  Surgeon:  Leotis Pain, MD  Assistant: Hezzie Bump, PA-C  Anesthesia:  Local  EBL:  Minimal  Indication for the Procedure:  The patient has had return of some renal function and no longer needs their permcath.  This can be removed.  Risks and benefits are discussed and informed consent is obtained.  Description of the Procedure:  The patient's left neck, chest and existing catheter were sterilely prepped and draped. The area around the catheter was anesthetized copiously with 1% lidocaine. The catheter was dissected out with curved hemostats until the cuff was freed from the surrounding fibrous sheath. The fiber sheath was transected, and the catheter was then removed in its entirety using gentle traction. Pressure was held and sterile dressings were placed. The patient tolerated the procedure well and was taken to the recovery room in stable condition.     Leotis Pain  07/18/2018, 11:50 AM This note was created with Dragon Medical transcription system. Any errors in dictation are purely unintentional.

## 2018-07-18 NOTE — H&P (Signed)
[facility] Admission History & Physical  MRN : [id number]  Debra Paul is a 68 y.o. (1950/09/17) female who presents with chief complaint of No chief complaint on file. Marland Kitchen  History of Present Illness: I am asked to evaluate the patient by the dialysis center. The patient was sent here because they have had enough return of the renal function that we can remove her dialysis catheter.  She still has significant chronic kidney disease.  No other complaints today.  Patient denies pain or tenderness overlying the access.  There is no pain with dialysis.  The patient denies hand pain or finger pain consistent with steal syndrome.  No fevers or chills while on dialysis.   No current facility-administered medications for this encounter.     Past Medical History:  Diagnosis Date  . Diabetes mellitus without complication ([place])   . GERD (gastroesophageal reflux disease)   . Hypercholesteremia   . Kidney stones    Takes lisinopril for kidney protection  . Parathyroid disorder ([place])   . Sleep apnea    does not use CPAP  . Vertigo    in past    Past Surgical History:  Procedure Laterality Date  . CATARACT EXTRACTION W/PHACO Left 05/14/2017   Procedure: CATARACT EXTRACTION PHACO AND INTRAOCULAR LENS PLACEMENT ([place]) LEFT DIABETIC;  Surgeon: Leandrew Koyanagi, MD;  Location: [place];  Service: Ophthalmology;  Laterality: Left;  diabetic  . CYSTOSCOPY WITH URETEROSCOPY AND STENT PLACEMENT  09/29/2015   Dr. Bernardo Heater  . DIALYSIS/PERMA CATHETER INSERTION Left 06/21/2018   Procedure: DIALYSIS/PERMA CATHETER INSERTION;  Surgeon: Katha Cabal, MD;  Location: [place] [facility];  Service: Cardiovascular;  Laterality: Left;  . ESOPHAGOGASTRODUODENOSCOPY ENDOSCOPY    . LITHOTRIPSY  2002  . PARATHYROIDECTOMY  1990    Social History Social History   Tobacco Use  . Smoking status: Never Smoker  . Smokeless tobacco: Never Used  Substance Use  Topics  . Alcohol use: No    Comment: 2-3times a year  . Drug use: No    Family History Family History  Problem Relation Age of Onset  . Colon cancer Father   . Heart disease Mother   . Breast cancer Neg Hx     No family history of bleeding or clotting disorders, autoimmune disease or porphyria  Allergies  Allergen Reactions  . Prilosec [Omeprazole] Other (See Comments)    Reaction: unknown  . Ciprofloxacin Rash     REVIEW OF SYSTEMS (Negative unless checked)  Constitutional: [] Weight loss  [] Fever  [] Chills Cardiac: [] Chest pain   [] Chest pressure   [] Palpitations   [] Shortness of breath when laying flat   [] Shortness of breath at rest   [x] Shortness of breath with exertion. Vascular:  [] Pain in legs with walking   [] Pain in legs at rest   [] Pain in legs when laying flat   [] Claudication   [] Pain in feet when walking  [] Pain in feet at rest  [] Pain in feet when laying flat   [] History of DVT   [] Phlebitis   [] Swelling in legs   [] Varicose veins   [] Non-healing ulcers Pulmonary:   [] Uses home oxygen   [] Productive cough   [] Hemoptysis   [] Wheeze  [] COPD   [] Asthma Neurologic:  [] Dizziness  [] Blackouts   [] Seizures   [] History of stroke   [] History of TIA  [] Aphasia   [] Temporary blindness   [] Dysphagia   [] Weakness or numbness in arms   [] Weakness or numbness in legs Musculoskeletal:  []   Arthritis   [] Joint swelling   [] Joint pain   [] Low back pain Hematologic:  [] Easy bruising  [] Easy bleeding   [] Hypercoagulable state   [] Anemic  [] Hepatitis Gastrointestinal:  [] Blood in stool   [] Vomiting blood  [] Gastroesophageal reflux/heartburn   [] Difficulty swallowing. Genitourinary:  [x] Chronic kidney disease   [] Difficult urination  [] Frequent urination  [] Burning with urination   [] Blood in urine Skin:  [] Rashes   [] Ulcers   [] Wounds Psychological:  [] History of anxiety   []  History of major depression.  Physical Examination  Vitals:   07/18/18 1015  BP: (!) 138/92  Pulse: 96   Resp: 16  Temp: 97.8 F (36.6 C)  Weight: 96.6 kg  Height: 5\' 9"  (1.753 m)   Body mass index is 31.45 kg/m. Gen: WD/WN, NAD Head: [place]/AT, No temporalis wasting.  Ear/Nose/Throat: Hearing grossly intact, nares w/o erythema or drainage, oropharynx w/o Erythema/Exudate,  Eyes: Conjunctiva clear, sclera non-icteric Neck: Trachea midline.  No JVD.  Pulmonary:  Good air movement, respirations not labored, no use of accessory muscles.  Cardiac: RRR, normal S1, S2. Vascular:  Vessel Right Left  Radial Palpable Palpable              Gastrointestinal: soft, non-tender/non-distended. No guarding/reflex.  Musculoskeletal: M/S 5/5 throughout.  Extremities without ischemic changes.  No deformity or atrophy.  Neurologic: Sensation grossly intact in extremities.  Symmetrical.  Speech is fluent. Motor exam as listed above. Psychiatric: Judgment intact, Mood & affect appropriate for pt's clinical situation. Dermatologic: No rashes or ulcers noted.  No cellulitis or open wounds.    CBC Lab Results  Component Value Date   WBC 5.5 06/22/2018   HGB 7.6 (L) 06/22/2018   HCT 24.3 (L) 06/22/2018   MCV 89.0 06/22/2018   PLT 72 (L) 06/22/2018    BMET    Component Value Date/Time   NA 138 06/22/2018 0622   NA 138 10/17/2017 1024   K 3.8 06/22/2018 0622   CL 103 06/22/2018 0622   CO2 28 06/22/2018 0622   GLUCOSE 126 (H) 06/22/2018 0622   BUN 42 (H) 06/22/2018 0622   BUN 42 (H) 10/17/2017 1024   CREATININE 3.04 (H) 06/22/2018 0622   CALCIUM 7.2 (L) 06/22/2018 0622   GFRNONAA 15 (L) 06/22/2018 0622   GFRAA 18 (L) 06/22/2018 0622   CrCl cannot be calculated (Patient's most recent lab result is older than the maximum 21 days allowed.).  COAG No results found for: INR, PROTIME  Radiology US Renal  Result Date: 06/18/2018 CLINICAL DATA:  68 year old presenting with acute renal failure. EXAM: RENAL / URINARY TRACT ULTRASOUND COMPLETE COMPARISON:  Complete abdominal ultrasound 08/25/2011.  FINDINGS: Right Kidney: Renal measurements: Approximately 10.0 x 6.0 x 5.4 cm = volume: 169 mL. No hydronephrosis. Diffuse cortical thinning. Renal volume loss since the prior examination in 2013 where the kidney measured approximately 13.9 cm in length. Echogenic parenchyma. 8 mm calculus in a LOWER pole calyx. No parenchymal masses. Left Kidney: Renal measurements: Approximately 10.5 x 6.2 x 6.5 cm = volume: 222 mL. No hydronephrosis. Cortical thinning involving the UPPER and LOWER poles. Renal volume loss since the prior examination in 2013 where the kidney measured approximately 11.8 cm in length. Echogenic parenchyma. 10 mm calculus in a mid calyx. No parenchymal masses. Bladder: Decompressed by Foley catheter. IMPRESSION: 1. No evidence of hydronephrosis involving either kidney to indicate urinary tract obstruction as the cause of acute renal failure. 2. Echogenic parenchyma in both kidneys indicating medical renal disease. 3. Diffuse cortical thinning  involving the RIGHT kidney and cortical thinning involving the UPPER and LOWER pole of the LEFT kidney. Significant volume loss in both kidneys when compared to a prior ultrasound in 2013. 4. BILATERAL nephrolithiasis. Electronically Signed   By: Evangeline Dakin M.D.   On: 06/18/2018 13:34    Assessment/Plan 1.  Complication dialysis device with PermCath not being used:  Patient's renal function has improved to the point where she does not need dialysis and her catheter removal is requested by her nephrologist.  Therefore, the patient will undergo removal of the tunneled catheter under local anesthesia.  The risks and benefits were described to the patient.  All questions were answered.  The patient agrees to proceed with angiography and intervention. Potassium will be drawn to ensure that it is an appropriate level prior to performing intervention. 2.  Chronic kidney disease: Not needing dialysis but should still consider outpatient work-up for AV fistula  creation as she is certainly near end-stage renal disease. 3.  Hypertension:  Patient will continue medical management; nephrology is following no changes in oral medications. 4. Diabetes mellitus:  Glucose will be monitored and oral medications been held this morning once the patient has undergone the patient's procedure po intake will be reinitiated and again Accu-Cheks will be used to assess the blood glucose level and treat as needed. The patient will be restarted on the patient's usual hypoglycemic regime     Leotis Pain, MD  07/18/2018 11:05 AM

## 2018-07-19 ENCOUNTER — Ambulatory Visit: Payer: Medicare Other | Admitting: Family Medicine

## 2018-07-23 ENCOUNTER — Ambulatory Visit: Payer: Medicare Other | Attending: Physician Assistant | Admitting: Physical Therapy

## 2018-07-23 ENCOUNTER — Encounter: Payer: Self-pay | Admitting: Physical Therapy

## 2018-07-23 DIAGNOSIS — R262 Difficulty in walking, not elsewhere classified: Secondary | ICD-10-CM

## 2018-07-23 DIAGNOSIS — M6281 Muscle weakness (generalized): Secondary | ICD-10-CM

## 2018-07-23 NOTE — Therapy (Signed)
[place] [facility]IN [facility] [address] [place], [place], [address] Phone: [phone]   Fax:  [phone]  Physical Therapy Evaluation  Patient Details  Name: Debra Paul MRN: [id number] Date of Birth: [birth year] Referring Provider (PT): Mar Daring    Encounter Date: 07/23/2018  PT End of Session - 07/23/18 3299    Number of Visits  1    Date for PT Re-Evaluation  07/23/18    PT Start Time  0330    PT Stop Time  0405    PT Time Calculation (min)  35 min    Equipment Utilized During Treatment  Gait belt    Activity Tolerance  Patient tolerated treatment well    Behavior During Therapy  [facility] for tasks assessed/performed       Past Medical History:  Diagnosis Date  . Diabetes mellitus without complication ([place])   . GERD (gastroesophageal reflux disease)   . Hypercholesteremia   . Kidney stones    Takes lisinopril for kidney protection  . Parathyroid disorder ([place])   . Sleep apnea    does not use CPAP  . Vertigo    in past    Past Surgical History:  Procedure Laterality Date  . CATARACT EXTRACTION W/PHACO Left 05/14/2017   Procedure: CATARACT EXTRACTION PHACO AND INTRAOCULAR LENS PLACEMENT ([place]) LEFT DIABETIC;  Surgeon: Leandrew Koyanagi, MD;  Location: [place];  Service: Ophthalmology;  Laterality: Left;  diabetic  . CYSTOSCOPY WITH URETEROSCOPY AND STENT PLACEMENT  09/29/2015   Dr. Bernardo Heater  . DIALYSIS/PERMA CATHETER INSERTION Left 06/21/2018   Procedure: DIALYSIS/PERMA CATHETER INSERTION;  Surgeon: Katha Cabal, MD;  Location: [place] [facility];  Service: Cardiovascular;  Laterality: Left;  . DIALYSIS/PERMA CATHETER REMOVAL N/A 07/18/2018   Procedure: DIALYSIS/PERMA CATHETER REMOVAL;  Surgeon: Algernon Huxley, MD;  Location: [place] [facility];  Service: Cardiovascular;  Laterality: N/A;  . ESOPHAGOGASTRODUODENOSCOPY ENDOSCOPY    . LITHOTRIPSY  2002  . PARATHYROIDECTOMY  1990     There were no vitals filed for this visit.   Subjective Assessment - 07/23/18 1527    Subjective  Patient wants to be able to walk for 45 mins and get back into an exercise program.     Pertinent History  Patient was feeling unwell and thought she had the flu. She wnet to the hospital dec 16th, she was in the hospital  for 1 week. she was on dialysis and now she no longer needs the dialysis.     How long can you sit comfortably?  15 mins    How long can you walk comfortably?  45 mins     Patient Stated Goals  to be able to walk for 45 mins and to get into the exercises .     Currently in Pain?  No/denies    Pain Score  0-No pain    Multiple Pain Sites  No         [facility] PT Assessment - 07/23/18 1531      Assessment   Medical Diagnosis  weakness    Referring Provider (PT)  Fenton Malling M     Onset Date/Surgical Date  06/17/18    Hand Dominance  Right    Next MD Visit  09/15/18    Prior Therapy  hospital      Precautions   Precautions  None      Restrictions   Weight Bearing Restrictions  No      Balance Screen  Has the patient fallen in the past 6 months  No    Has the patient had a decrease in activity level because of a fear of falling?   Yes    Is the patient reluctant to leave their home because of a fear of falling?   No      Home Environment   Living Environment  Private residence    Living Arrangements  Alone    Available Help at Discharge  Family    Type of [place] to enter    Entrance Stairs-Number of Steps  [place]  One level    [place] - 2 wheels;[place] - single point      Prior Function   Level of Independence  Independent;Independent with basic ADLs;Independent with household mobility without device;Independent with gait    Vocation  Retired    Leisure  shut in work, Clinical research associate Status  Within [employer] for tasks assessed          POSTURE: WNL    PROM/AROM: WFL BUE and BLE  STRENGTH:  Graded on a 0-5 scale Muscle Group Left Right                          Hip Flex 5/5 5/5  Hip Abd 5/5 5/5  Hip Add 3/5 3/5  Hip Ext 3+/5 3+/5  Hip IR/ER 4/5 4/5  Knee Flex 5/5 5/5  Knee Ext 5/5 5/5  Ankle DF 5/5 5/5  Ankle PF 4/5 4/5   SENSATION: intact BUE and BLE  FUNCTIONAL MOBILITY: independent with transfers sit to stand    BALANCE: static standing normal Dynamic standing normal   GAIT: Patient has normal gait speed, no deviations and no AD  OUTCOME MEASURES: TEST Outcome Interpretation  5 times sit<>stand 9.91sec >60 yo, >15 sec indicates increased risk for falls  10 meter walk test 1.34                m/s <1.0 m/s indicates increased risk for falls; limited community ambulator  Timed up and Go       6.97          sec <14 sec indicates increased risk for falls                          Objective measurements completed on examination: See above findings.                           Plan - 07/23/18 1616    Clinical Impression Statement  Patient presents for evaluation for weakness following hospital admission. She has normal balance and normal  gait speed without defiits . She has no strength deficits except B ankle PF 4/5, hip ext 3+/5. She has no falls risk with normal results from TUG, 5 x sit to stand and 10 MW test. She is currently working out at a gym and does not have any physical thery needs at this time.     PT Frequency  One time visit    PT Home Exercise Plan  single heel raises, machne heel raises    Consulted and Agree with Plan of Care  Patient       Patient will benefit  from skilled therapeutic intervention in order to improve the following deficits and impairments:     Visit Diagnosis: Muscle weakness (generalized)  Difficulty in walking, not elsewhere classified     Problem List Patient Active Problem List   Diagnosis Date Noted  .  History of sepsis 06/18/2018  . Anemia 10/13/2015  . Hematuria 10/11/2015  . Calculus of kidney 02/10/2015  . Benign hypertension 11/23/2014  . Adult BMI 30+ 11/23/2014  . Chronic kidney disease (CKD), stage III (moderate) ([place]) 11/23/2014  . Colon polyp 11/23/2014  . Controlled type 2 diabetes mellitus with diabetic nephropathy ([place]) 11/23/2014  . Fatty infiltration of liver 11/23/2014  . Herpes zona 11/23/2014  . Hypercholesteremia 11/23/2014  . Disordered sleep 11/23/2014  . Essential (primary) hypertension 11/23/2014    Alanson Puls, [place] DPT 07/23/2018, 4:36 PM  [place] MAIN [facility] SERVICES [address] [place], [place], [address] Phone: [phone]   Fax:  [phone]  Name: Debra Paul MRN: [id number] Date of Birth: 1950-09-13

## 2018-07-30 ENCOUNTER — Encounter: Payer: Medicare Other | Admitting: Physical Therapy

## 2018-08-06 ENCOUNTER — Encounter: Payer: Medicare Other | Admitting: Physical Therapy

## 2018-08-08 ENCOUNTER — Encounter: Payer: Medicare Other | Admitting: Physical Therapy

## 2018-08-11 ENCOUNTER — Other Ambulatory Visit: Payer: Self-pay | Admitting: Physician Assistant

## 2018-08-11 DIAGNOSIS — E7800 Pure hypercholesterolemia, unspecified: Secondary | ICD-10-CM

## 2018-08-13 ENCOUNTER — Encounter: Payer: Medicare Other | Admitting: Physical Therapy

## 2018-08-15 ENCOUNTER — Encounter: Payer: Medicare Other | Admitting: Physical Therapy

## 2018-08-20 ENCOUNTER — Encounter: Payer: Medicare Other | Admitting: Physical Therapy

## 2018-08-22 ENCOUNTER — Encounter: Payer: Medicare Other | Admitting: Physical Therapy

## 2018-08-27 ENCOUNTER — Encounter: Payer: Medicare Other | Admitting: Physical Therapy

## 2018-08-29 ENCOUNTER — Encounter: Payer: Medicare Other | Admitting: Physical Therapy

## 2018-09-25 ENCOUNTER — Ambulatory Visit: Payer: Medicare Other | Admitting: Physician Assistant

## 2018-09-27 ENCOUNTER — Ambulatory Visit (INDEPENDENT_AMBULATORY_CARE_PROVIDER_SITE_OTHER): Payer: Medicare Other | Admitting: Physician Assistant

## 2018-09-27 ENCOUNTER — Telehealth: Payer: Self-pay

## 2018-09-27 ENCOUNTER — Encounter: Payer: Self-pay | Admitting: Physician Assistant

## 2018-09-27 DIAGNOSIS — E1122 Type 2 diabetes mellitus with diabetic chronic kidney disease: Secondary | ICD-10-CM

## 2018-09-27 DIAGNOSIS — I10 Essential (primary) hypertension: Secondary | ICD-10-CM

## 2018-09-27 DIAGNOSIS — N184 Chronic kidney disease, stage 4 (severe): Secondary | ICD-10-CM

## 2018-09-27 NOTE — Progress Notes (Signed)
Virtual Visit via Video Note  I connected with Debra Paul on 09/27/18 at  9:40 AM EDT by a video enabled telemedicine application and verified that I am speaking with the correct person using two identifiers.   I discussed the limitations of evaluation and management by telemedicine and the availability of in person appointments. The patient expressed understanding and agreed to proceed.  History of Present Illness: Debra Paul is a 68 yr old female that is following up her CKD stage 4, BP and T2DM. She reports she is doing well. Had labs with Dr. Candiss Norse, Nephrology, on 08/14/18. Creatinine had improved to 2.03. BP reading today at home was 136/79 via automatic wrist cuff. Temp was 97.5. HR was 73. Fasting sugar this morning was 174. Patient does report eating more fruit recently.    Observations/Objective: Home vitals: BP 136/79, HR 73, T 97.5, Wt 217.4 WDWN female appearing stated age. No apparent distress. Head:Normocephalic, atraumatic. Neck: supple and normal ROM. Lungs: No respiratory distress. Psych: Normal mood and affect. Normal attention.   Assessment and Plan: 1. Essential (primary) hypertension BP stable. Continue Amlodipine 2.5mg  daily. Recheck in 3 months via office or evisit with drive up Z6X and BP testing.  2. Controlled type 2 diabetes mellitus with stage 4 chronic kidney disease, without long-term current use of insulin (HCC) Fasting sugar today was 174. Reports eating more fruit. Currently only on pioglitazone 15mg  daily due to renal impairment. I will see her back in 3 months in office for A1c or via evisit with drive up testing if COVID 19 pandemic is still prevalent.    Follow Up Instructions: 3 months   I discussed the assessment and treatment plan with the patient. The patient was provided an opportunity to ask questions and all were answered. The patient agreed with the plan and demonstrated an understanding of the instructions.   The patient was  advised to call back or seek an in-person evaluation if the symptoms worsen or if the condition fails to improve as anticipated.  I provided 20 minutes of non-face-to-face time during this encounter.   Mar Daring, PA-C

## 2018-09-27 NOTE — Telephone Encounter (Signed)
Left message for patient to call back to schedule office visit in 3 mths to follow up diabetes.

## 2018-09-30 NOTE — Telephone Encounter (Signed)
Patient scheduled.

## 2018-11-10 ENCOUNTER — Other Ambulatory Visit: Payer: Self-pay | Admitting: Physician Assistant

## 2018-11-10 DIAGNOSIS — E119 Type 2 diabetes mellitus without complications: Secondary | ICD-10-CM

## 2018-12-27 NOTE — Progress Notes (Signed)
Patient: Debra Paul Female    DOB: 1950-09-30   68 y.o.   MRN: [id number] Visit Date: 01/16/2019  Today's Provider: Mar Daring, PA-C   Chief Complaint  Patient presents with  . Follow-up    HTN, T2DM   Subjective:    I,Joseline E. Rosas,RMA am acting as a Education administrator for [employer], PA-C.  HPI Patient here for 3 month f/u.  Essential (primary) hypertension:BP stable. Continue Amlodipine 2.5mg  daily  Type 2 diabetes mellitus with stage 4 chronic kidney disease,follow [facility] only on pioglitazone 15mg  daily due to renal impairment. Patient check her sugar levels sometimes. Last time she checked it was back in March 164.  Allergies  Allergen Reactions  . Prilosec [Omeprazole] Other (See Comments)    Reaction: unknown  . Ciprofloxacin Rash     Current Outpatient Medications:  .  acetaminophen (TYLENOL) 325 MG tablet, Take 2 tablets (650 mg total) by mouth every 6 (six) hours as needed for mild pain (or Fever >/= 101)., Disp: , Rfl:  .  amLODipine (NORVASC) 2.5 MG tablet, Take 1 tablet (2.5 mg total) by mouth at bedtime., Disp: 30 tablet, Rfl: 0 .  ferrous sulfate 325 (65 FE) MG tablet, Take 325 mg by mouth daily. , Disp: , Rfl:  .  Multiple Vitamin (MULTI-VITAMIN DAILY) TABS, , Disp: , Rfl:  .  pioglitazone (ACTOS) 15 MG tablet, TAKE 1 TABLET BY MOUTH  DAILY, Disp: 90 tablet, Rfl: 1 .  sodium bicarbonate 650 MG tablet, Take 650 mg by mouth 2 (two) times daily., Disp: , Rfl:  .  pravastatin (PRAVACHOL) 20 MG tablet, TAKE 1 TABLET BY MOUTH AT  BEDTIME, Disp: 90 tablet, Rfl: 1  Review of Systems  Constitutional: Negative.   Respiratory: Negative.   Cardiovascular: Negative.   Neurological: Negative.     Social History   Tobacco Use  . Smoking status: Never Smoker  . Smokeless tobacco: Never Used  Substance Use Topics  . Alcohol use: No    Comment: 2-3times a year      Objective:   BP 124/81 (BP Location: Left Arm, Patient Position:  Sitting, Cuff Size: Large)   Pulse 80   Temp 97.9 F (36.6 C) (Oral)   Resp 16   Wt 224 lb 12.8 oz (102 kg)   BMI 33.20 kg/m  Vitals:   12/30/18 0808  BP: 124/81  Pulse: 80  Resp: 16  Temp: 97.9 F (36.6 C)  TempSrc: Oral  Weight: 224 lb 12.8 oz (102 kg)     Physical Exam Vitals signs reviewed.  Constitutional:      General: She is not in acute distress.    Appearance: Normal appearance. She is well-developed. She is not ill-appearing or diaphoretic.  HENT:     Head: Normocephalic and atraumatic.  Neck:     Musculoskeletal: Normal range of motion and neck supple.     Thyroid: No thyromegaly.     Vascular: No JVD.     Trachea: No tracheal deviation.  Cardiovascular:     Rate and Rhythm: Normal rate and regular rhythm.     Pulses: Normal pulses.     Heart sounds: Normal heart sounds. No murmur. No friction rub. No gallop.   Pulmonary:     Effort: Pulmonary effort is normal. No respiratory distress.     Breath sounds: Normal breath sounds. No wheezing or rales.  Lymphadenopathy:     Cervical: No cervical adenopathy.  Neurological:  Mental Status: She is alert.      Results for orders placed or performed in visit on 12/30/18  POCT glycosylated hemoglobin (Hb A1C)  Result Value Ref Range   Hemoglobin A1C 6.0 (A) 4.0 - 5.6 %   Est. average glucose Bld gHb Est-mCnc 126        Assessment & Plan    1. Controlled type 2 diabetes mellitus with stage 4 chronic kidney disease, without long-term current use of insulin (HCC) A1c today stable at 6.0. Continue Pioglitazone 15mg  daily. I will see her back in 6 months for her CPE.  2. Essential hypertension Stable. Continue Amlodipine 2.5mg .   3. Class 1 obesity due to excess calories with serious comorbidity and body mass index (BMI) of 33.0 to 33.9 in adult Counseled patient on healthy lifestyle modifications including dieting and exercise.      Mar Daring, PA-C  [facility]

## 2018-12-30 ENCOUNTER — Ambulatory Visit (INDEPENDENT_AMBULATORY_CARE_PROVIDER_SITE_OTHER): Payer: Medicare Other | Admitting: Physician Assistant

## 2018-12-30 ENCOUNTER — Other Ambulatory Visit: Payer: Self-pay

## 2018-12-30 ENCOUNTER — Encounter: Payer: Self-pay | Admitting: Physician Assistant

## 2018-12-30 VITALS — BP 124/81 | HR 80 | Temp 97.9°F | Resp 16 | Wt 224.8 lb

## 2018-12-30 DIAGNOSIS — E6609 Other obesity due to excess calories: Secondary | ICD-10-CM | POA: Diagnosis not present

## 2018-12-30 DIAGNOSIS — N184 Chronic kidney disease, stage 4 (severe): Secondary | ICD-10-CM | POA: Diagnosis not present

## 2018-12-30 DIAGNOSIS — Z6833 Body mass index (BMI) 33.0-33.9, adult: Secondary | ICD-10-CM

## 2018-12-30 DIAGNOSIS — E1122 Type 2 diabetes mellitus with diabetic chronic kidney disease: Secondary | ICD-10-CM

## 2018-12-30 DIAGNOSIS — I10 Essential (primary) hypertension: Secondary | ICD-10-CM | POA: Diagnosis not present

## 2018-12-30 LAB — POCT GLYCOSYLATED HEMOGLOBIN (HGB A1C)
Est. average glucose Bld gHb Est-mCnc: 126
Hemoglobin A1C: 6 % — AB (ref 4.0–5.6)

## 2018-12-30 NOTE — Patient Instructions (Signed)

## 2019-01-11 ENCOUNTER — Other Ambulatory Visit: Payer: Self-pay | Admitting: Physician Assistant

## 2019-01-11 DIAGNOSIS — E7800 Pure hypercholesterolemia, unspecified: Secondary | ICD-10-CM

## 2019-02-18 ENCOUNTER — Telehealth: Payer: Self-pay | Admitting: Physician Assistant

## 2019-02-18 MED ORDER — AMLODIPINE BESYLATE 2.5 MG PO TABS: 2.5000 mg | ORAL_TABLET | Freq: Every day | ORAL | 0 refills | Status: DC

## 2019-02-18 NOTE — Telephone Encounter (Signed)
Pt needs a refill on Amlodipine 2.5 mg  [facility]  CB#  [phone]  [place]

## 2019-02-24 ENCOUNTER — Telehealth: Payer: Self-pay | Admitting: Physician Assistant

## 2019-02-24 MED ORDER — AMLODIPINE BESYLATE 2.5 MG PO TABS: 2.5000 mg | ORAL_TABLET | Freq: Every day | ORAL | 1 refills | Status: DC

## 2019-02-24 NOTE — Addendum Note (Signed)
Addended by: Julieta Bellini on: 02/24/2019 09:43 AM   Modules accepted: Orders

## 2019-02-24 NOTE — Telephone Encounter (Signed)
Pt needing a refill on:  amLODipine (NORVASC) 2.5 MG tablet  Please call into:   [place], [place] [phone] (Phone) [phone] (Fax)  Was sent to wrong pharmacy -[facility]   Thanks, [facility]

## 2019-03-06 ENCOUNTER — Other Ambulatory Visit: Payer: Self-pay | Admitting: Nephrology

## 2019-03-06 ENCOUNTER — Ambulatory Visit
Admission: RE | Admit: 2019-03-06 | Discharge: 2019-03-06 | Disposition: A | Discharge status: Home / Self Care | Payer: Medicare Other | Source: Ambulatory Visit | Attending: Nephrology | Admitting: Nephrology

## 2019-03-06 ENCOUNTER — Other Ambulatory Visit: Payer: Self-pay

## 2019-03-06 ENCOUNTER — Ambulatory Visit
Admission: RE | Admit: 2019-03-06 | Discharge: 2019-03-06 | Disposition: A | Discharge status: Home / Self Care | Payer: Medicare Other | Attending: Nephrology | Admitting: Nephrology

## 2019-03-06 DIAGNOSIS — N200 Calculus of kidney: Secondary | ICD-10-CM | POA: Insufficient documentation

## 2019-03-12 ENCOUNTER — Telehealth: Payer: Self-pay | Admitting: Urology

## 2019-03-12 NOTE — Telephone Encounter (Signed)
Spoke with patient and it has been over three years since she has seen [facility] and advised her to have [facility] fax over a referral. Patient stated understanding and will have them send one over.   [name]

## 2019-03-12 NOTE — Telephone Encounter (Signed)
The patient needs an appointment.

## 2019-03-12 NOTE — Telephone Encounter (Signed)
Pt LMOM and states that she was a pt of [facility]. She states that she has been followed by [facility] and has a 82mm stone in right kidney also others in left kidney. She would like to know if she needs to make an appt or if someone could look at her XRAY from [place] and advise her what to do. Please advise.

## 2019-03-17 ENCOUNTER — Telehealth: Payer: Self-pay | Admitting: Physician Assistant

## 2019-03-17 ENCOUNTER — Other Ambulatory Visit: Payer: Self-pay | Admitting: *Deleted

## 2019-03-17 DIAGNOSIS — Z20822 Contact with and (suspected) exposure to covid-19: Secondary | ICD-10-CM

## 2019-03-17 NOTE — Telephone Encounter (Signed)
She can go to [facility] visitors entrance for testing. Needs to isolate until test results are received.

## 2019-03-17 NOTE — Telephone Encounter (Signed)
Patient advised.

## 2019-03-17 NOTE — Telephone Encounter (Signed)
Pt was exposed to COVID at a doctor's office on Friday.  Is being told by the health dept. she will need to be tested for COVID.  Please advise.  Thanks, [employer]

## 2019-03-17 NOTE — Telephone Encounter (Signed)
Please advise 

## 2019-03-18 LAB — NOVEL CORONAVIRUS, NAA: SARS-CoV-2, NAA: NOT DETECTED

## 2019-03-24 ENCOUNTER — Other Ambulatory Visit: Payer: Self-pay | Admitting: Physician Assistant

## 2019-03-24 DIAGNOSIS — E119 Type 2 diabetes mellitus without complications: Secondary | ICD-10-CM

## 2019-04-01 ENCOUNTER — Ambulatory Visit (INDEPENDENT_AMBULATORY_CARE_PROVIDER_SITE_OTHER): Payer: Medicare Other

## 2019-04-01 DIAGNOSIS — Z23 Encounter for immunization: Secondary | ICD-10-CM

## 2019-04-07 ENCOUNTER — Other Ambulatory Visit: Payer: Self-pay | Admitting: Physician Assistant

## 2019-04-07 DIAGNOSIS — Z1231 Encounter for screening mammogram for malignant neoplasm of breast: Secondary | ICD-10-CM

## 2019-04-16 ENCOUNTER — Encounter: Payer: Self-pay | Admitting: Urology

## 2019-04-16 ENCOUNTER — Ambulatory Visit: Payer: Medicare Other | Admitting: Urology

## 2019-04-16 ENCOUNTER — Other Ambulatory Visit: Payer: Self-pay

## 2019-04-16 VITALS — BP 135/82 | HR 88 | Ht 69.0 in | Wt 225.0 lb

## 2019-04-16 DIAGNOSIS — M7540 Impingement syndrome of unspecified shoulder: Secondary | ICD-10-CM | POA: Insufficient documentation

## 2019-04-16 DIAGNOSIS — N200 Calculus of kidney: Secondary | ICD-10-CM | POA: Diagnosis not present

## 2019-04-16 DIAGNOSIS — M25519 Pain in unspecified shoulder: Secondary | ICD-10-CM | POA: Insufficient documentation

## 2019-04-16 DIAGNOSIS — M25619 Stiffness of unspecified shoulder, not elsewhere classified: Secondary | ICD-10-CM | POA: Insufficient documentation

## 2019-04-16 NOTE — Progress Notes (Signed)
04/16/2019 10:57 AM   Debra Paul [birth year] [id number]  Referring provider: Mar Daring, PA-C [address] [place],  [place] [address]  Chief Complaint  Patient presents with  . Nephrolithiasis    New Patient    Urologic history:  1.  Nephrolithiasis  -Left ureteroscopy 09/2015 11 mm UPJ calculus, calcium oxalate monohydrate  -Bilateral nonobstructing renal calculi  -Metabolic evaluation low urine volume, hypocitraturia, low pH   HPI: Debra Paul is a 68 y.o. female seen at request of Dr. Candiss Norse for bilateral nephrolithiasis.  She has a long history of recurrent stone disease.  I was following her at [facility] and last saw her 08/2016.  Last imaging at [facility] January 2019 showed bilateral renal calculi.  She was followed by [facility] for CKD and is currently being followed in [place].  A renal ultrasound ordered December 2019 showed an 8 mm right lower pole renal calculus and a 10 mm left mid calyceal calculus.  She had a KUB in 03/2019 which showed a stable right lower pole renal calculus.  Faint calcifications were noted overlying the left renal outline however a 10 mm calculus was not visualized.  She presently denies flank, abdominal or pelvic pain.  Denies gross hematuria.   PMH: Past Medical History:  Diagnosis Date  . Diabetes mellitus without complication ([place])   . GERD (gastroesophageal reflux disease)   . Hypercholesteremia   . Kidney stones    Takes lisinopril for kidney protection  . Parathyroid disorder ([place])   . Sleep apnea    does not use CPAP  . Vertigo    in past    Surgical History: Past Surgical History:  Procedure Laterality Date  . CATARACT EXTRACTION W/PHACO Left 05/14/2017   Procedure: CATARACT EXTRACTION PHACO AND INTRAOCULAR LENS PLACEMENT ([employer]) LEFT DIABETIC;  Surgeon: Leandrew Koyanagi, MD;  Location: [facility];  Service: Ophthalmology;  Laterality: Left;  diabetic  . CYSTOSCOPY WITH  URETEROSCOPY AND STENT PLACEMENT  09/29/2015   Dr. Bernardo Heater  . DIALYSIS/PERMA CATHETER INSERTION Left 06/21/2018   Procedure: DIALYSIS/PERMA CATHETER INSERTION;  Surgeon: Katha Cabal, MD;  Location: [place] CV [facility];  Service: Cardiovascular;  Laterality: Left;  . DIALYSIS/PERMA CATHETER REMOVAL N/A 07/18/2018   Procedure: DIALYSIS/PERMA CATHETER REMOVAL;  Surgeon: Algernon Huxley, MD;  Location: [place] [facility];  Service: Cardiovascular;  Laterality: N/A;  . ESOPHAGOGASTRODUODENOSCOPY ENDOSCOPY    . LITHOTRIPSY  2002  . PARATHYROIDECTOMY  1990    Home Medications:  Allergies as of 04/16/2019      Reactions   Prilosec [omeprazole] Other (See Comments)   Reaction: unknown   Ciprofloxacin Rash      Medication List       Accurate as of April 16, 2019 10:57 AM. If you have any questions, ask your nurse or doctor.        STOP taking these medications   pioglitazone 15 MG tablet Commonly known as: ACTOS Stopped by: Abbie Sons, MD     TAKE these medications   acetaminophen 325 MG tablet Commonly known as: TYLENOL Take 2 tablets (650 mg total) by mouth every 6 (six) hours as needed for mild pain (or Fever >/= 101).   amLODipine 2.5 MG tablet Commonly known as: NORVASC Take 1 tablet (2.5 mg total) by mouth at bedtime.   ferrous sulfate 325 (65 FE) MG tablet Take 325 mg by mouth daily.   Multi-Vitamin Daily Tabs   pravastatin 20 MG tablet Commonly known as: PRAVACHOL  TAKE 1 TABLET BY MOUTH AT  BEDTIME   sodium bicarbonate 650 MG tablet Take 650 mg by mouth 2 (two) times daily.       Allergies:  Allergies  Allergen Reactions  . Prilosec [Omeprazole] Other (See Comments)    Reaction: unknown  . Ciprofloxacin Rash    Family History: Family History  Problem Relation Age of Onset  . Colon cancer Father   . Heart disease Mother   . Breast cancer Neg Hx     Social History:  reports that she has never smoked. She has never used smokeless tobacco.  She reports that she does not drink alcohol or use drugs.  ROS: UROLOGY Frequent Urination?: No Hard to postpone urination?: No Burning/pain with urination?: No Get up at night to urinate?: No Leakage of urine?: No Urine stream starts and stops?: No Trouble starting stream?: No Do you have to strain to urinate?: No Blood in urine?: No Urinary tract infection?: No Sexually transmitted disease?: No Injury to kidneys or bladder?: No Painful intercourse?: No Weak stream?: No Currently pregnant?: No Vaginal bleeding?: No Last menstrual period?: n  Gastrointestinal Nausea?: No Vomiting?: No Indigestion/heartburn?: No Diarrhea?: No Constipation?: No  Constitutional Fever: No Night sweats?: No Weight loss?: No Fatigue?: No  Skin Skin rash/lesions?: No Itching?: No  Eyes Blurred vision?: No Double vision?: No  Ears/Nose/Throat Sore throat?: No Sinus problems?: No  Hematologic/Lymphatic Swollen glands?: No Easy bruising?: No  Cardiovascular Leg swelling?: No Chest pain?: No  Respiratory Cough?: No Shortness of breath?: No  Endocrine Excessive thirst?: No  Musculoskeletal Back pain?: No Joint pain?: No  Neurological Headaches?: No Dizziness?: No  Psychologic Depression?: No Anxiety?: No  Physical Exam: BP 135/82   Pulse 88   Ht 5\' 9"  (1.753 m)   Wt 225 lb (102.1 kg)   BMI 33.23 kg/m   Constitutional:  Alert and oriented, No acute distress. HEENT: [place] AT, moist mucus membranes.  Trachea midline, no masses. Cardiovascular: No clubbing, cyanosis, or edema. Respiratory: Normal respiratory effort, no increased work of breathing. Skin: No rashes, bruises or suspicious lesions. Neurologic: Grossly intact, no focal deficits, moving all 4 extremities. Psychiatric: Normal mood and affect.  Assessment & Plan:    - Bilateral nephrolithiasis Bilateral, nonobstructing renal calculi.  Will continue to monitor.  She is no longer taking potassium citrate  due to her CKD.  She has a follow-up appointment with nephrology January 2021. She was given ingredients of Litholyte to see if this would be okay to take.  Follow-up renal ultrasound/KUB in 1 year and she was instructed to call earlier for development of left flank pain.   Abbie Sons, [place] [address] [place], [place] [address] [phone]

## 2019-04-17 ENCOUNTER — Encounter: Payer: Self-pay | Admitting: Urology

## 2019-04-17 LAB — URINALYSIS, COMPLETE
Bilirubin, UA: NEGATIVE
Glucose, UA: NEGATIVE
Ketones, UA: NEGATIVE
Nitrite, UA: NEGATIVE
Specific Gravity, UA: >1.03 — ABNORMAL HIGH (ref 1.005–1.030)
Urobilinogen, Ur: 0.2 mg/dL (ref 0.2–1.0)
pH, UA: 5 (ref 5.0–7.5)

## 2019-04-17 LAB — MICROSCOPIC EXAMINATION
RBC, Urine: NONE SEEN /hpf (ref 0–2)
WBC, UA: >30 /hpf — AB (ref 0–5)

## 2019-04-21 NOTE — Progress Notes (Signed)
Subjective:   Debra Paul is a 68 y.o. female who presents for Medicare Annual (Subsequent) preventive examination.    This visit is being conducted through telemedicine due to the COVID-19 pandemic. This patient has given me verbal consent via doximity to conduct this visit, patient states they are participating from their home address. Some vital signs may be absent or patient reported.    Patient identification: identified by name, DOB, and current address  Review of Systems:  N/A  Cardiac Risk Factors include: advanced age (>71men, >63 women);diabetes mellitus;dyslipidemia;obesity (BMI >30kg/m2);hypertension     Objective:     Vitals: There were no vitals taken for this visit.  There is no height or weight on file to calculate BMI. Unable to obtain vitals due to visit being conducted via telephonically.   Advanced Directives 04/22/2019 07/23/2018 06/21/2018 06/17/2018 05/14/2017 10/12/2016 10/12/2016  Does Patient Have a Medical Advance Directive? No No No No No No No  Would patient like information on creating a medical advance directive? No - Patient declined No - Patient declined No - Patient declined - No - Patient declined - -    Tobacco Social History   Tobacco Use  Smoking Status Never Smoker  Smokeless Tobacco Never Used     Counseling given: Not Answered   Clinical Intake:  Pre-visit preparation completed: Yes  Pain : No/denies pain Pain Score: 0-No pain     Nutritional Risks: None Diabetes: Yes  How often do you need to have someone help you when you read instructions, pamphlets, or other written materials from your doctor or pharmacy?: 1 - Never   Diabetes:  Is the patient diabetic?  Yes type 2 If diabetic, was a CBG obtained today?  No  Did the patient bring in their glucometer from home?  No  How often do you monitor your CBG's? Once every couple weeks.   Financial Strains and Diabetes Management:  Are you having any financial  strains with the device, your supplies or your medication? No .  Does the patient want to be seen by Chronic Care Management for management of their diabetes?  No  Would the patient like to be referred to a Nutritionist or for Diabetic Management?  No   Diabetic Exams:  Diabetic Eye Exam: Completed 11/13/17. Overdue for diabetic eye exam. Pt has been advised about the importance in completing this exam.   Diabetic Foot Exam: Completed 12/30/18. Repeat yearly.    Interpreter Needed?: No  Information entered by :: [facility], LPN  Past Medical History:  Diagnosis Date  . Diabetes mellitus without complication ([place])   . GERD (gastroesophageal reflux disease)   . Hypercholesteremia   . Kidney stones    Takes lisinopril for kidney protection  . Kidney stones   . Parathyroid disorder ([place])   . Sleep apnea    does not use CPAP  . Vertigo    in past   Past Surgical History:  Procedure Laterality Date  . CATARACT EXTRACTION W/PHACO Left 05/14/2017   Procedure: CATARACT EXTRACTION PHACO AND INTRAOCULAR LENS PLACEMENT ([place]) LEFT DIABETIC;  Surgeon: Leandrew Koyanagi, MD;  Location: [facility];  Service: Ophthalmology;  Laterality: Left;  diabetic  . CYSTOSCOPY WITH URETEROSCOPY AND STENT PLACEMENT  09/29/2015   Dr. Bernardo Heater  . DIALYSIS/PERMA CATHETER INSERTION Left 06/21/2018   Procedure: DIALYSIS/PERMA CATHETER INSERTION;  Surgeon: Katha Cabal, MD;  Location: [facility];  Service: Cardiovascular;  Laterality: Left;  . DIALYSIS/PERMA CATHETER REMOVAL N/A 07/18/2018  Procedure: DIALYSIS/PERMA CATHETER REMOVAL;  Surgeon: Algernon Huxley, MD;  Location: [facility];  Service: Cardiovascular;  Laterality: N/A;  . ESOPHAGOGASTRODUODENOSCOPY ENDOSCOPY    . LITHOTRIPSY  2002  . PARATHYROIDECTOMY  1990   Family History  Problem Relation Age of Onset  . Colon cancer Father   . Heart disease Mother   . Breast cancer Neg Hx    Social History   Socioeconomic  History  . Marital status: Single    Spouse name: Not on file  . Number of children: 1  . Years of education: Not on file  . Highest education level: Some college, no degree  Occupational History  . Occupation: retired  Scientific laboratory technician  . Financial resource strain: Not on file  . Food insecurity    Worry: Never true    Inability: Never true  . Transportation needs    Medical: No    Non-medical: No  Tobacco Use  . Smoking status: Never Smoker  . Smokeless tobacco: Never Used  Substance and Sexual Activity  . Alcohol use: No    Comment: 2-3times a year  . Drug use: No  . Sexual activity: Not on file  Lifestyle  . Physical activity    Days per week: 0 days    Minutes per session: 0 min  . Stress: Not at all  Relationships  . Social Herbalist on phone: Patient refused    Gets together: Patient refused    Attends religious service: Patient refused    Active member of club or organization: Patient refused    Attends meetings of clubs or organizations: Patient refused    Relationship status: Patient refused  Other Topics Concern  . Not on file  Social History Narrative  . Not on file    Outpatient Encounter Medications as of 04/22/2019  Medication Sig  . acetaminophen (TYLENOL) 325 MG tablet Take 2 tablets (650 mg total) by mouth every 6 (six) hours as needed for mild pain (or Fever >/= 101).  Marland Kitchen amLODipine (NORVASC) 2.5 MG tablet Take 1 tablet (2.5 mg total) by mouth at bedtime.  . ferrous sulfate 325 (65 FE) MG tablet Take 325 mg by mouth daily.   . Multiple Vitamin (MULTI-VITAMIN DAILY) TABS daily.   . pravastatin (PRAVACHOL) 20 MG tablet TAKE 1 TABLET BY MOUTH AT  BEDTIME  . sodium bicarbonate 650 MG tablet Take 650 mg by mouth 2 (two) times daily.  . [DISCONTINUED] pioglitazone (ACTOS) 15 MG tablet TAKE 1 TABLET BY MOUTH  DAILY   No facility-administered encounter medications on file as of 04/22/2019.     Activities of Daily Living In your present state  of health, do you have any difficulty performing the following activities: 04/22/2019 06/21/2018  Hearing? N N  Vision? N N  Difficulty concentrating or making decisions? N N  Walking or climbing stairs? N N  Dressing or bathing? N N  Doing errands, shopping? N N  Preparing Food and eating ? N -  Using the Toilet? N -  In the past six months, have you accidently leaked urine? N -  Do you have problems with loss of bowel control? N -  Managing your Medications? N -  Managing your Finances? N -  Housekeeping or managing your Housekeeping? N -  Some recent data might be hidden    Patient Care Team: Mar Daring, PA-C as PCP - General (Family Medicine) Murlean Iba, MD (Nephrology) Abbie Sons, MD (Urology) Kerin Ransom,  Stephannie Li, OD (Optometry)    Assessment:   This is a routine wellness examination for Philip.  Exercise Activities and Dietary recommendations Current Exercise Habits: The patient does not participate in regular exercise at present, Exercise limited by: None identified  Goals    . Exercise 3x per week (30 min per time)     Recommend to start walking 3 days a week for at least 30 minutes at a time.        Fall Risk: Fall Risk  04/22/2019 10/17/2017 10/12/2016  Falls in the past year? 0 No No  Number falls in past yr: 0 - -  Injury with Fall? 0 - -    FALL RISK PREVENTION PERTAINING TO THE HOME:  Any stairs in or around the home? Yes  If so, are there any without handrails? No   Home free of loose throw rugs in walkways, pet beds, electrical cords, etc? Yes  Adequate lighting in your home to reduce risk of falls? Yes   ASSISTIVE DEVICES UTILIZED TO PREVENT FALLS:  Life alert? No  Use of a cane, walker or w/c? No  Grab bars in the bathroom? No  Shower chair or bench in shower? No  Elevated toilet seat or a handicapped toilet? Yes   TIMED UP AND GO:  Was the test performed? No .    Depression Screen PHQ 2/9 Scores 04/22/2019 10/17/2017  10/12/2016 10/11/2015  PHQ - 2 Score 0 0 0 0  PHQ- 9 Score - - 0 -     Cognitive Function: Declined today.         Immunization History  Administered Date(s) Administered  . Fluad Quad(high Dose 65+) 04/01/2019  . Influenza, High Dose Seasonal PF 04/17/2018  . Influenza-Unspecified 04/08/2015  . Pneumococcal Conjugate-13 10/12/2016  . Pneumococcal Polysaccharide-23 10/17/2017  . Tdap 09/04/2011    Qualifies for Shingles Vaccine? Yes . Due for Shingrix. Education has been provided regarding the importance of this vaccine. Pt has been advised to call insurance company to determine out of pocket expense. Advised may also receive vaccine at local pharmacy or Health Dept. Verbalized acceptance and understanding.  Tdap: Up to date  Flu Vaccine: Up to date  Pneumococcal Vaccine: Completed series  Screening Tests Health Maintenance  Topic Date Due  . URINE MICROALBUMIN  10/10/2016  . OPHTHALMOLOGY EXAM  11/14/2018  . COLONOSCOPY  11/26/2018  . HEMOGLOBIN A1C  07/01/2019  . FOOT EXAM  12/30/2019  . MAMMOGRAM  05/20/2020  . TETANUS/TDAP  09/03/2021  . INFLUENZA VACCINE  Completed  . DEXA SCAN  Completed  . Hepatitis C Screening  Completed  . PNA vac Low Risk Adult  Completed    Cancer Screenings:  Colorectal Screening: Completed 11/25/13. Repeat every 5 years. Declined referral today.  Mammogram: Completed 05/20/18.   Bone Density: Completed 11/23/16. Results reflect NORMAL. No repeat needed unless advised by a physician.   Lung Cancer Screening: (Low Dose CT Chest recommended if Age 37-80 years, 30 pack-year currently smoking OR have quit w/in 15years.) does not qualify.   Additional Screening:  Hepatitis C Screening: Up to date  Dental Screening: Recommended annual dental exams for proper oral hygiene   Community Resource Referral:  CRR required this visit?  No       Plan:  I have personally reviewed and addressed the Medicare Annual Wellness questionnaire and  have noted the following in the patient's chart:  A. Medical and social history B. Use of alcohol, tobacco or illicit drugs  C. Current medications and supplements D. Functional ability and status E.  Nutritional status F.  Physical activity G. Advance directives H. List of other physicians I.  Hospitalizations, surgeries, and ER visits in previous 12 months J.  [place] such as hearing and vision if needed, cognitive and depression L. Referrals and appointments   In addition, I have reviewed and discussed with patient certain preventive protocols, quality metrics, and best practice recommendations. A written personalized care plan for preventive services as well as general preventive health recommendations were provided to patient.   Glendora Score, LPN  [id number] Nurse Health Advisor   Nurse Notes: Pt to schedule an eye exam. Declined a colonoscopy referral today. Pt to speak with PCP about this at next in office visit.

## 2019-04-22 ENCOUNTER — Ambulatory Visit (INDEPENDENT_AMBULATORY_CARE_PROVIDER_SITE_OTHER): Payer: Medicare Other

## 2019-04-22 ENCOUNTER — Other Ambulatory Visit: Payer: Self-pay

## 2019-04-22 DIAGNOSIS — Z0000 Encounter for general adult medical examination without abnormal findings: Secondary | ICD-10-CM | POA: Diagnosis not present

## 2019-04-22 NOTE — Patient Instructions (Signed)
Debra Paul , Thank you for taking time to come for your Medicare Wellness Visit. I appreciate your ongoing commitment to your health goals. Please review the following plan we discussed and let me know if I can assist you in the future.   Screening recommendations/referrals: Colonoscopy: Currently due, declined referral today.  Mammogram: Up to date, due 05/2020 Bone Density: Up to date. Previous DEXA was normal. No repeat needed unless advised by a physician. Recommended yearly ophthalmology/optometry visit for glaucoma screening and checkup Recommended yearly dental visit for hygiene and checkup  Vaccinations: Influenza vaccine: Up to date Pneumococcal vaccine: Completed series Tdap vaccine: Up to date, due 08/2021 Shingles vaccine: Pt declines today.     Advanced directives: Advance directive discussed with you today. Even though you declined this today please call our office should you change your mind and we can give you the proper paperwork for you to fill out.  Conditions/risks identified: Recommend to start walking 3 days a week for at least 30 minutes at a time.   Next appointment: 07/06/18 @ 8:00 AM with Fenton Malling.    Preventive Care 29 Years and Older, Female Preventive care refers to lifestyle choices and visits with your health care provider that can promote health and wellness. What does preventive care include?  A yearly physical exam. This is also called an annual well check.  Dental exams once or twice a year.  Routine eye exams. Ask your health care provider how often you should have your eyes checked.  Personal lifestyle choices, including:  Daily care of your teeth and gums.  Regular physical activity.  Eating a healthy diet.  Avoiding tobacco and drug use.  Limiting alcohol use.  Practicing safe sex.  Taking low-dose aspirin every day.  Taking vitamin and mineral supplements as recommended by your health care provider. What happens  during an annual well check? The services and screenings done by your health care provider during your annual well check will depend on your age, overall health, lifestyle risk factors, and family history of disease. Counseling  Your health care provider may ask you questions about your:  Alcohol use.  Tobacco use.  Drug use.  Emotional well-being.  Home and relationship well-being.  Sexual activity.  Eating habits.  History of falls.  Memory and ability to understand (cognition).  Work and work Statistician.  Reproductive health. Screening  You may have the following tests or measurements:  Height, weight, and BMI.  Blood pressure.  Lipid and cholesterol levels. These may be checked every 5 years, or more frequently if you are over 68 years old.  Skin check.  Lung cancer screening. You may have this screening every year starting at age 23 if you have a 30-pack-year history of smoking and currently smoke or have quit within the past 15 years.  Fecal occult blood test (FOBT) of the stool. You may have this test every year starting at age 77.  Flexible sigmoidoscopy or colonoscopy. You may have a sigmoidoscopy every 5 years or a colonoscopy every 10 years starting at age 58.  Hepatitis C blood test.  Hepatitis B blood test.  Sexually transmitted disease (STD) testing.  Diabetes screening. This is done by checking your blood sugar (glucose) after you have not eaten for a while (fasting). You may have this done every 1-3 years.  Bone density scan. This is done to screen for osteoporosis. You may have this done starting at age 48.  Mammogram. This may be done every 1-2 years.  Talk to your health care provider about how often you should have regular mammograms. Talk with your health care provider about your test results, treatment options, and if necessary, the need for more tests. Vaccines  Your health care provider may recommend certain vaccines, such as:   Influenza vaccine. This is recommended every year.  Tetanus, diphtheria, and acellular pertussis (Tdap, Td) vaccine. You may need a Td booster every 10 years.  Zoster vaccine. You may need this after age 11.  Pneumococcal 13-valent conjugate (PCV13) vaccine. One dose is recommended after age 2.  Pneumococcal polysaccharide (PPSV23) vaccine. One dose is recommended after age 65. Talk to your health care provider about which screenings and vaccines you need and how often you need them. This information is not intended to replace advice given to you by your health care provider. Make sure you discuss any questions you have with your health care provider. Document Released: 07/16/2015 Document Revised: 03/08/2016 Document Reviewed: 04/20/2015 Elsevier Interactive Patient Education  2017 [place] Prevention in the Home Falls can cause injuries. They can happen to people of all ages. There are many things you can do to make your home safe and to help prevent falls. What can I do on the outside of my home?  Regularly fix the edges of walkways and driveways and fix any cracks.  Remove anything that might make you trip as you walk through a door, such as a raised step or threshold.  Trim any bushes or trees on the path to your home.  Use bright outdoor lighting.  Clear any walking paths of anything that might make someone trip, such as rocks or tools.  Regularly check to see if handrails are loose or broken. Make sure that both sides of any steps have handrails.  Any raised decks and porches should have guardrails on the edges.  Have any leaves, snow, or ice cleared regularly.  Use sand or salt on walking paths during winter.  Clean up any spills in your garage right away. This includes oil or grease spills. What can I do in the bathroom?  Use night lights.  Install grab bars by the toilet and in the tub and shower. Do not use towel bars as grab bars.  Use non-skid mats  or decals in the tub or shower.  If you need to sit down in the shower, use a plastic, non-slip stool.  Keep the floor dry. Clean up any water that spills on the floor as soon as it happens.  Remove soap buildup in the tub or shower regularly.  Attach bath mats securely with double-sided non-slip rug tape.  Do not have throw rugs and other things on the floor that can make you trip. What can I do in the bedroom?  Use night lights.  Make sure that you have a light by your bed that is easy to reach.  Do not use any sheets or blankets that are too big for your bed. They should not hang down onto the floor.  Have a firm chair that has side arms. You can use this for support while you get dressed.  Do not have throw rugs and other things on the floor that can make you trip. What can I do in the kitchen?  Clean up any spills right away.  Avoid walking on wet floors.  Keep items that you use a lot in easy-to-reach places.  If you need to reach something above you, use a strong step stool  that has a grab bar.  Keep electrical cords out of the way.  Do not use floor polish or wax that makes floors slippery. If you must use wax, use non-skid floor wax.  Do not have throw rugs and other things on the floor that can make you trip. What can I do with my stairs?  Do not leave any items on the stairs.  Make sure that there are handrails on both sides of the stairs and use them. Fix handrails that are broken or loose. Make sure that handrails are as long as the stairways.  Check any carpeting to make sure that it is firmly attached to the stairs. Fix any carpet that is loose or worn.  Avoid having throw rugs at the top or bottom of the stairs. If you do have throw rugs, attach them to the floor with carpet tape.  Make sure that you have a light switch at the top of the stairs and the bottom of the stairs. If you do not have them, ask someone to add them for you. What else can I do to  help prevent falls?  Wear shoes that:  Do not have high heels.  Have rubber bottoms.  Are comfortable and fit you well.  Are closed at the toe. Do not wear sandals.  If you use a stepladder:  Make sure that it is fully opened. Do not climb a closed stepladder.  Make sure that both sides of the stepladder are locked into place.  Ask someone to hold it for you, if possible.  Clearly mark and make sure that you can see:  Any grab bars or handrails.  First and last steps.  Where the edge of each step is.  Use tools that help you move around (mobility aids) if they are needed. These include:  Canes.  Walkers.  Scooters.  Crutches.  Turn on the lights when you go into a dark area. Replace any light bulbs as soon as they burn out.  Set up your furniture so you have a clear path. Avoid moving your furniture around.  If any of your floors are uneven, fix them.  If there are any pets around you, be aware of where they are.  Review your medicines with your doctor. Some medicines can make you feel dizzy. This can increase your chance of falling. Ask your doctor what other things that you can do to help prevent falls. This information is not intended to replace advice given to you by your health care provider. Make sure you discuss any questions you have with your health care provider. Document Released: 04/15/2009 Document Revised: 11/25/2015 Document Reviewed: 07/24/2014 Elsevier Interactive Patient Education  2017 [employer].

## 2019-04-24 ENCOUNTER — Encounter: Payer: Self-pay | Admitting: Physician Assistant

## 2019-05-22 ENCOUNTER — Ambulatory Visit
Admission: RE | Admit: 2019-05-22 | Discharge: 2019-05-22 | Disposition: A | Discharge status: Home / Self Care | Payer: Medicare Other | Source: Ambulatory Visit | Attending: Physician Assistant | Admitting: Physician Assistant

## 2019-05-22 ENCOUNTER — Other Ambulatory Visit: Payer: Self-pay | Admitting: Physician Assistant

## 2019-05-22 DIAGNOSIS — N6320 Unspecified lump in the left breast, unspecified quadrant: Secondary | ICD-10-CM

## 2019-05-22 DIAGNOSIS — Z1231 Encounter for screening mammogram for malignant neoplasm of breast: Secondary | ICD-10-CM | POA: Insufficient documentation

## 2019-05-22 DIAGNOSIS — R928 Other abnormal and inconclusive findings on diagnostic imaging of breast: Secondary | ICD-10-CM

## 2019-05-23 ENCOUNTER — Other Ambulatory Visit: Payer: Self-pay | Admitting: Physician Assistant

## 2019-05-23 DIAGNOSIS — E7800 Pure hypercholesterolemia, unspecified: Secondary | ICD-10-CM

## 2019-05-28 ENCOUNTER — Ambulatory Visit
Admission: RE | Admit: 2019-05-28 | Discharge: 2019-05-28 | Disposition: A | Discharge status: Home / Self Care | Payer: Medicare Other | Source: Ambulatory Visit | Attending: Physician Assistant | Admitting: Physician Assistant

## 2019-05-28 DIAGNOSIS — R928 Other abnormal and inconclusive findings on diagnostic imaging of breast: Secondary | ICD-10-CM

## 2019-05-28 DIAGNOSIS — N6320 Unspecified lump in the left breast, unspecified quadrant: Secondary | ICD-10-CM

## 2019-07-02 ENCOUNTER — Other Ambulatory Visit: Payer: Self-pay | Admitting: Physician Assistant

## 2019-07-07 ENCOUNTER — Ambulatory Visit (INDEPENDENT_AMBULATORY_CARE_PROVIDER_SITE_OTHER): Payer: Medicare Other | Admitting: Physician Assistant

## 2019-07-07 ENCOUNTER — Encounter: Payer: Self-pay | Admitting: Physician Assistant

## 2019-07-07 ENCOUNTER — Other Ambulatory Visit: Payer: Self-pay

## 2019-07-07 VITALS — BP 145/89 | HR 63 | Temp 97.5°F | Ht 69.0 in | Wt 231.0 lb

## 2019-07-07 DIAGNOSIS — Z0000 Encounter for general adult medical examination without abnormal findings: Secondary | ICD-10-CM

## 2019-07-07 DIAGNOSIS — N184 Chronic kidney disease, stage 4 (severe): Secondary | ICD-10-CM | POA: Diagnosis not present

## 2019-07-07 DIAGNOSIS — K760 Fatty (change of) liver, not elsewhere classified: Secondary | ICD-10-CM

## 2019-07-07 DIAGNOSIS — E7800 Pure hypercholesterolemia, unspecified: Secondary | ICD-10-CM

## 2019-07-07 DIAGNOSIS — I10 Essential (primary) hypertension: Secondary | ICD-10-CM | POA: Diagnosis not present

## 2019-07-07 DIAGNOSIS — Q828 Other specified congenital malformations of skin: Secondary | ICD-10-CM

## 2019-07-07 DIAGNOSIS — N2581 Secondary hyperparathyroidism of renal origin: Secondary | ICD-10-CM | POA: Diagnosis not present

## 2019-07-07 DIAGNOSIS — E215 Disorder of parathyroid gland, unspecified: Secondary | ICD-10-CM | POA: Insufficient documentation

## 2019-07-07 DIAGNOSIS — E1121 Type 2 diabetes mellitus with diabetic nephropathy: Secondary | ICD-10-CM

## 2019-07-07 DIAGNOSIS — N1831 Chronic kidney disease, stage 3a: Secondary | ICD-10-CM | POA: Diagnosis not present

## 2019-07-07 DIAGNOSIS — N200 Calculus of kidney: Secondary | ICD-10-CM | POA: Diagnosis not present

## 2019-07-07 NOTE — Progress Notes (Signed)
Patient: Debra Paul, Female    DOB: 05-27-1951, 70 y.o.   MRN: [id number] Visit Date: 07/07/2019  Today's Provider: Mar Daring, PA-C   Chief Complaint  Patient presents with  . Annual Exam   Subjective:     Complete Physical Debra Paul is a 69 y.o. female. She feels well. Pt reports having trouble with left foot pain. Pt also noticed a "knot/ripple" on her head.    She reports exercising regularly. She reports she is sleeping well. -----------------------------------------------------------   Review of Systems  Constitutional: Negative.   HENT: Negative.   Eyes: Negative.   Respiratory: Negative.   Cardiovascular: Negative.   Gastrointestinal: Negative.   Endocrine: Negative.   Genitourinary: Negative.   Musculoskeletal: Negative.   Skin: Negative.   Allergic/Immunologic: Negative.   Neurological: Negative.   Hematological: Negative.   Psychiatric/Behavioral: Negative.     Social History   Socioeconomic History  . Marital status: Single    Spouse name: Not on file  . Number of children: 1  . Years of education: Not on file  . Highest education level: Some college, no degree  Occupational History  . Occupation: retired  Tobacco Use  . Smoking status: Never Smoker  . Smokeless tobacco: Never Used  Substance and Sexual Activity  . Alcohol use: No    Comment: 2-3times a year  . Drug use: No  . Sexual activity: Not on file  Other Topics Concern  . Not on file  Social History Narrative  . Not on file   Social Determinants of Health   Financial Resource Strain:   . Difficulty of Paying Living Expenses: Not on file  Food Insecurity: No Food Insecurity  . Worried About Charity fundraiser in the Last Year: Never true  . Ran Out of Food in the Last Year: Never true  Transportation Needs: No Transportation Needs  . Lack of Transportation (Medical): No  . Lack of Transportation (Non-Medical): No  Physical Activity: Inactive    . Days of Exercise per Week: 0 days  . Minutes of Exercise per Session: 0 min  Stress: No Stress Concern Present  . Feeling of Stress : Not at all  Social Connections: Unknown  . Frequency of Communication with Friends and Family: Patient refused  . Frequency of Social Gatherings with Friends and Family: Patient refused  . Attends Religious Services: Patient refused  . Active Member of Clubs or Organizations: Patient refused  . Attends Archivist Meetings: Patient refused  . Marital Status: Patient refused  Intimate Partner Violence: Unknown  . Fear of Current or Ex-Partner: Patient refused  . Emotionally Abused: Patient refused  . Physically Abused: Patient refused  . Sexually Abused: Patient refused    Past Medical History:  Diagnosis Date  . Diabetes mellitus without complication ([place])   . GERD (gastroesophageal reflux disease)   . Hypercholesteremia   . Kidney stones    Takes lisinopril for kidney protection  . Kidney stones   . Parathyroid disorder ([place])   . Sleep apnea    does not use CPAP  . Vertigo    in past     Patient Active Problem List   Diagnosis Date Noted  . Impingement syndrome of shoulder region 04/16/2019  . Shoulder pain 04/16/2019  . Stiffness of shoulder joint 04/16/2019  . History of sepsis 06/18/2018  . Guaiac positive stools 11/20/2016  . Hypocitraturia 08/21/2016  . Anemia 10/13/2015  . Hematuria 10/11/2015  .  Calculus of kidney 02/10/2015  . Adult BMI 30+ 11/23/2014  . Chronic kidney disease (CKD), stage III (moderate) 11/23/2014  . Colon polyp 11/23/2014  . Controlled type 2 diabetes mellitus with diabetic nephropathy ([place]) 11/23/2014  . Fatty infiltration of liver 11/23/2014  . Herpes zona 11/23/2014  . Hypercholesteremia 11/23/2014  . Disordered sleep 11/23/2014  . Essential (primary) hypertension 11/23/2014    Past Surgical History:  Procedure Laterality Date  . CATARACT EXTRACTION W/PHACO Left 05/14/2017    Procedure: CATARACT EXTRACTION PHACO AND INTRAOCULAR LENS PLACEMENT ([place]) LEFT DIABETIC;  Surgeon: Leandrew Koyanagi, MD;  Location: [facility];  Service: Ophthalmology;  Laterality: Left;  diabetic  . CYSTOSCOPY WITH URETEROSCOPY AND STENT PLACEMENT  09/29/2015   Dr. Bernardo Heater  . DIALYSIS/PERMA CATHETER INSERTION Left 06/21/2018   Procedure: DIALYSIS/PERMA CATHETER INSERTION;  Surgeon: Katha Cabal, MD;  Location: [facility];  Service: Cardiovascular;  Laterality: Left;  . DIALYSIS/PERMA CATHETER REMOVAL N/A 07/18/2018   Procedure: DIALYSIS/PERMA CATHETER REMOVAL;  Surgeon: Algernon Huxley, MD;  Location: [facility];  Service: Cardiovascular;  Laterality: N/A;  . ESOPHAGOGASTRODUODENOSCOPY ENDOSCOPY    . LITHOTRIPSY  2002  . PARATHYROIDECTOMY  1990    Her family history includes Colon cancer in her father; Heart disease in her mother. There is no history of Breast cancer.   Current Outpatient Medications:  .  acetaminophen (TYLENOL) 325 MG tablet, Take 2 tablets (650 mg total) by mouth every 6 (six) hours as needed for mild pain (or Fever >/= 101)., Disp: , Rfl:  .  amLODipine (NORVASC) 2.5 MG tablet, TAKE 1 TABLET BY MOUTH AT  BEDTIME, Disp: 90 tablet, Rfl: 0 .  ferrous sulfate 325 (65 FE) MG tablet, Take 325 mg by mouth daily. , Disp: , Rfl:  .  Multiple Vitamin (MULTI-VITAMIN DAILY) TABS, daily. , Disp: , Rfl:  .  pravastatin (PRAVACHOL) 20 MG tablet, TAKE 1 TABLET BY MOUTH AT  BEDTIME, Disp: 90 tablet, Rfl: 1 .  sodium bicarbonate 650 MG tablet, Take 650 mg by mouth 2 (two) times daily., Disp: , Rfl:   Patient Care Team: Mar Daring, PA-C as PCP - General (Family Medicine) Murlean Iba, MD (Nephrology) Abbie Sons, MD (Urology) Bryson Ha, OD (Optometry)     Objective:    Vitals: Temp (!) 97.5 F (36.4 C) (Temporal)   Ht 5\' 9"  (1.753 m)   Wt 231 lb (104.8 kg)   BMI 34.11 kg/m   Physical Exam Vitals reviewed.   Constitutional:      General: She is not in acute distress.    Appearance: Normal appearance. She is well-developed. She is obese. She is not ill-appearing or diaphoretic.  HENT:     Head: Normocephalic and atraumatic.     Right Ear: Tympanic membrane, ear canal and external ear normal.     Left Ear: Tympanic membrane, ear canal and external ear normal.  Eyes:     General: No scleral icterus.       Right eye: No discharge.        Left eye: No discharge.     Extraocular Movements: Extraocular movements intact.     Conjunctiva/sclera: Conjunctivae normal.     Pupils: Pupils are equal, round, and reactive to light.  Neck:     Thyroid: No thyromegaly.     Vascular: No carotid bruit or JVD.     Trachea: No tracheal deviation.  Cardiovascular:     Rate and Rhythm: Normal rate and regular  rhythm.     Pulses: Normal pulses.     Heart sounds: Normal heart sounds. No murmur. No friction rub. No gallop.   Pulmonary:     Effort: Pulmonary effort is normal. No respiratory distress.     Breath sounds: Normal breath sounds. No wheezing or rales.  Chest:     Chest wall: No tenderness.  Abdominal:     General: Abdomen is protuberant. Bowel sounds are normal. There is no distension.     Palpations: Abdomen is soft. There is no mass.     Tenderness: There is no abdominal tenderness. There is no guarding or rebound.  Musculoskeletal:        General: No tenderness. Normal range of motion.     Cervical back: Normal range of motion and neck supple.     Right lower leg: No edema.     Left lower leg: No edema.  Lymphadenopathy:     Cervical: No cervical adenopathy.  Skin:    General: Skin is warm and dry.     Capillary Refill: Capillary refill takes less than 2 seconds.     Findings: No rash.     Comments: Soft tissue ridge on scalp running in an anteroposterior direction that is asymptomatic   Neurological:     General: No focal deficit present.     Mental Status: She is alert and oriented to  person, place, and time. Mental status is at baseline.  Psychiatric:        Mood and Affect: Mood normal.        Behavior: Behavior normal.        Thought Content: Thought content normal.        Judgment: Judgment normal.     Activities of Daily Living In your present state of health, do you have any difficulty performing the following activities: 04/22/2019  Hearing? N  Vision? N  Difficulty concentrating or making decisions? N  Walking or climbing stairs? N  Dressing or bathing? N  Doing errands, shopping? N  Preparing Food and eating ? N  Using the Toilet? N  In the past six months, have you accidently leaked urine? N  Do you have problems with loss of bowel control? N  Managing your Medications? N  Managing your Finances? N  Housekeeping or managing your Housekeeping? N  Some recent data might be hidden    Fall Risk Assessment Fall Risk  04/22/2019 10/17/2017 10/12/2016  Falls in the past year? 0 No No  Number falls in past yr: 0 - -  Injury with Fall? 0 - -     Depression Screen PHQ 2/9 Scores 04/22/2019 10/17/2017 10/12/2016 10/11/2015  PHQ - 2 Score 0 0 0 0  PHQ- 9 Score - - 0 -    No flowsheet data found.     Assessment & Plan:    Annual Physical Reviewed patient's Family Medical History Reviewed and updated list of patient's medical providers Assessment of cognitive impairment was done Assessed patient's functional ability Established a written schedule for health screening [place] Completed and Reviewed  Exercise Activities and Dietary recommendations Goals    . Exercise 3x per week (30 min per time)     Recommend to start walking 3 days a week for at least 30 minutes at a time.        Immunization History  Administered Date(s) Administered  . Fluad Quad(high Dose 65+) 04/01/2019  . Influenza, High Dose Seasonal PF 04/17/2018  . Influenza-Unspecified 04/08/2015  .  Pneumococcal Conjugate-13 10/12/2016  . Pneumococcal  Polysaccharide-23 10/17/2017  . Tdap 09/04/2011    Health Maintenance  Topic Date Due  . URINE MICROALBUMIN  10/10/2016  . OPHTHALMOLOGY EXAM  11/14/2018  . HEMOGLOBIN A1C  07/01/2019  . FOOT EXAM  12/30/2019  . COLONOSCOPY  12/06/2020  . MAMMOGRAM  05/21/2021  . TETANUS/TDAP  09/03/2021  . INFLUENZA VACCINE  Completed  . DEXA SCAN  Completed  . Hepatitis C Screening  Completed  . PNA vac Low Risk Adult  Completed     Discussed health benefits of physical activity, and encouraged her to engage in regular exercise appropriate for her age and condition.    1. Annual physical exam Normal physical exam today. Will check labs as below and f/u pending lab results. If labs are stable and WNL she will not need to have these rechecked for one year at her next annual physical exam. She is to call the office in the meantime if she has any acute issue, questions or concerns.  2. Essential (primary) hypertension Stable. Continue amlodipine 2.5mg . Will check labs as below and f/u pending results. - CBC w/Diff/Platelet - HgB A1c - Lipid Profile - Hepatic function panel  3. Fatty infiltration of liver Stable. On pioglitazone 15mg .  - CBC w/Diff/Platelet - HgB A1c - Lipid Profile - Hepatic function panel  4. Controlled type 2 diabetes mellitus with diabetic nephropathy, without long-term current use of insulin ([facility]) Stable. Continue pioglitazone 15mg . Will check labs as below and f/u pending results. - CBC w/Diff/Platelet - HgB A1c - Lipid Profile - Hepatic function panel  5. Stage 4 chronic kidney disease Stable. Followed by Dr. Candiss Norse, Nephrology. - CBC w/Diff/Platelet - HgB A1c - Lipid Profile  6. Hypercholesteremia Stable. Continue Pravastatin 20mg .  - CBC w/Diff/Platelet - HgB A1c - Lipid Profile - Hepatic function panel  7. Parathyroid disorder ([place]) H/O this. Will check labs as below and f/u pending results. - TSH  8. Primary nonessential cutis verticis  gyrata Suspect this is the soft tissue ridge noted on the scalp. Unsure of cause, some correlation with cataracts, and she admits to having cataracts monitored by her eye doctor.   ------------------------------------------------------------------------------------------------------------    Mar Daring, PA-C  [facility]

## 2019-07-07 NOTE — Patient Instructions (Signed)
Health Maintenance After Age 68 After age 68, you are at a higher risk for certain long-term diseases and infections as well as injuries from falls. Falls are a major cause of broken bones and head injuries in people who are older than age 68. Getting regular preventive care can help to keep you healthy and well. Preventive care includes getting regular testing and making lifestyle changes as recommended by your health care provider. Talk with your health care provider about:  Which screenings and tests you should have. A screening is a test that checks for a disease when you have no symptoms.  A diet and exercise plan that is right for you. What should I know about screenings and tests to prevent falls? Screening and testing are the best ways to find a health problem early. Early diagnosis and treatment give you the best chance of managing medical conditions that are common after age 68. Certain conditions and lifestyle choices may make you more likely to have a fall. Your health care provider may recommend:  Regular vision checks. Poor vision and conditions such as cataracts can make you more likely to have a fall. If you wear glasses, make sure to get your prescription updated if your vision changes.  Medicine review. Work with your health care provider to regularly review all of the medicines you are taking, including over-the-counter medicines. Ask your health care provider about any side effects that may make you more likely to have a fall. Tell your health care provider if any medicines that you take make you feel dizzy or sleepy.  Osteoporosis screening. Osteoporosis is a condition that causes the bones to get weaker. This can make the bones weak and cause them to break more easily.  Blood pressure screening. Blood pressure changes and medicines to control blood pressure can make you feel dizzy.  Strength and balance checks. Your health care provider may recommend certain tests to check your  strength and balance while standing, walking, or changing positions.  Foot health exam. Foot pain and numbness, as well as not wearing proper footwear, can make you more likely to have a fall.  Depression screening. You may be more likely to have a fall if you have a fear of falling, feel emotionally low, or feel unable to do activities that you used to do.  Alcohol use screening. Using too much alcohol can affect your balance and may make you more likely to have a fall. What actions can I take to lower my risk of falls? General instructions  Talk with your health care provider about your risks for falling. Tell your health care provider if: ? You fall. Be sure to tell your health care provider about all falls, even ones that seem minor. ? You feel dizzy, sleepy, or off-balance.  Take over-the-counter and prescription medicines only as told by your health care provider. These include any supplements.  Eat a healthy diet and maintain a healthy weight. A healthy diet includes low-fat dairy products, low-fat (lean) meats, and fiber from whole grains, beans, and lots of fruits and vegetables. Home safety  Remove any tripping hazards, such as rugs, cords, and clutter.  Install safety equipment such as grab bars in bathrooms and safety rails on stairs.  Keep rooms and walkways well-lit. Activity   Follow a regular exercise program to stay fit. This will help you maintain your balance. Ask your health care provider what types of exercise are appropriate for you.  If you need a cane or   walker, use it as recommended by your health care provider.  Wear supportive shoes that have nonskid soles. Lifestyle  Do not drink alcohol if your health care provider tells you not to drink.  If you drink alcohol, limit how much you have: ? 0-1 drink a day for women. ? 0-2 drinks a day for men.  Be aware of how much alcohol is in your drink. In the [place] one drink equals one typical bottle of beer (12  oz), one-half glass of wine (5 oz), or one shot of hard liquor (1 oz).  Do not use any products that contain nicotine or tobacco, such as cigarettes and e-cigarettes. If you need help quitting, ask your health care provider. Summary  Having a healthy lifestyle and getting preventive care can help to protect your health and wellness after age 68.  Screening and testing are the best way to find a health problem early and help you avoid having a fall. Early diagnosis and treatment give you the best chance for managing medical conditions that are more common for people who are older than age 68.  Falls are a major cause of broken bones and head injuries in people who are older than age 68. Take precautions to prevent a fall at home.  Work with your health care provider to learn what changes you can make to improve your health and wellness and to prevent falls. This information is not intended to replace advice given to you by your health care provider. Make sure you discuss any questions you have with your health care provider. Document Revised: 10/10/2018 Document Reviewed: 05/02/2017 Elsevier Patient Education  2020 Elsevier Inc.  

## 2019-07-08 DIAGNOSIS — Q828 Other specified congenital malformations of skin: Secondary | ICD-10-CM | POA: Insufficient documentation

## 2019-07-08 LAB — CBC WITH DIFFERENTIAL/PLATELET
Basophils Absolute: 0.1 10*3/uL (ref 0.0–0.2)
Basos: 1 %
EOS (ABSOLUTE): 0.3 10*3/uL (ref 0.0–0.4)
Eos: 4 %
Hematocrit: 32.9 % — ABNORMAL LOW (ref 34.0–46.6)
Hemoglobin: 10.5 g/dL — ABNORMAL LOW (ref 11.1–15.9)
Immature Grans (Abs): 0.1 10*3/uL (ref 0.0–0.1)
Immature Granulocytes: 1 %
Lymphocytes Absolute: 1.7 10*3/uL (ref 0.7–3.1)
Lymphs: 20 %
MCH: 29.2 pg (ref 26.6–33.0)
MCHC: 31.9 g/dL (ref 31.5–35.7)
MCV: 92 fL (ref 79–97)
Monocytes Absolute: 0.6 10*3/uL (ref 0.1–0.9)
Monocytes: 7 %
Neutrophils Absolute: 5.7 10*3/uL (ref 1.4–7.0)
Neutrophils: 67 %
Platelets: 154 10*3/uL (ref 150–450)
RBC: 3.59 x10E6/uL — ABNORMAL LOW (ref 3.77–5.28)
RDW: 13.1 % (ref 11.7–15.4)
WBC: 8.5 10*3/uL (ref 3.4–10.8)

## 2019-07-08 LAB — HEPATIC FUNCTION PANEL
ALT: 24 IU/L (ref 0–32)
AST: 21 IU/L (ref 0–40)
Albumin: 4.3 g/dL (ref 3.8–4.8)
Alkaline Phosphatase: 109 IU/L (ref 39–117)
Bilirubin Total: 0.2 mg/dL (ref 0.0–1.2)
Bilirubin, Direct: 0.08 mg/dL (ref 0.00–0.40)
Total Protein: 6.4 g/dL (ref 6.0–8.5)

## 2019-07-08 LAB — HEMOGLOBIN A1C
Est. average glucose Bld gHb Est-mCnc: 117 mg/dL
Hgb A1c MFr Bld: 5.7 % — ABNORMAL HIGH (ref 4.8–5.6)

## 2019-07-08 LAB — TSH: TSH: 2.21 u[IU]/mL (ref 0.450–4.500)

## 2019-07-08 LAB — LIPID PANEL
Chol/HDL Ratio: 4.2 ratio (ref 0.0–4.4)
Cholesterol, Total: 193 mg/dL (ref 100–199)
HDL: 46 mg/dL (ref >39)
LDL Chol Calc (NIH): 114 mg/dL — ABNORMAL HIGH (ref 0–99)
Triglycerides: 188 mg/dL — ABNORMAL HIGH (ref 0–149)
VLDL Cholesterol Cal: 33 mg/dL (ref 5–40)

## 2019-08-07 DIAGNOSIS — Z0000 Encounter for general adult medical examination without abnormal findings: Secondary | ICD-10-CM | POA: Diagnosis not present

## 2019-08-07 DIAGNOSIS — N184 Chronic kidney disease, stage 4 (severe): Secondary | ICD-10-CM | POA: Diagnosis not present

## 2019-08-07 DIAGNOSIS — K760 Fatty (change of) liver, not elsewhere classified: Secondary | ICD-10-CM | POA: Diagnosis not present

## 2019-08-07 DIAGNOSIS — E1121 Type 2 diabetes mellitus with diabetic nephropathy: Secondary | ICD-10-CM | POA: Diagnosis not present

## 2019-08-07 DIAGNOSIS — I10 Essential (primary) hypertension: Secondary | ICD-10-CM | POA: Diagnosis not present

## 2019-08-08 ENCOUNTER — Ambulatory Visit: Payer: Medicare Other

## 2019-08-25 ENCOUNTER — Telehealth: Payer: Self-pay | Admitting: Physician Assistant

## 2019-08-25 NOTE — Telephone Encounter (Signed)
Pt advised.   Thanks,   -Teresita Fanton  

## 2019-08-25 NOTE — Telephone Encounter (Signed)
This is in error.

## 2019-08-25 NOTE — Telephone Encounter (Signed)
Copied from [place] [id number]. Topic: General - Inquiry >> Aug 25, 2019  8:23 AM Richardo Priest, NT wrote: Reason for CRM: Patient called in stating she received a letter from [facility] stating that PCP is no longer with them. Pt would like to confirm as the letter states they are switching her to Dr.B. Pt would like a call back if this is true and if so would like her appointment in July to be changed with Dr.B/

## 2019-09-01 ENCOUNTER — Encounter: Payer: Self-pay | Admitting: Physician Assistant

## 2019-09-01 NOTE — Telephone Encounter (Signed)
Sarah,  I know I talked to you about this last week, but here is a patient giving a phone number about this.   Thanks, JB

## 2019-09-11 NOTE — Progress Notes (Signed)
Pt called to follow up/ she looked at her insurance and Jennifer's name still doesn't come up and she still has Dr. B on her card/ Pt just wanted to find out what was the result / please advise

## 2019-09-21 ENCOUNTER — Other Ambulatory Visit: Payer: Self-pay | Admitting: Physician Assistant

## 2019-09-22 NOTE — Telephone Encounter (Signed)
Requested Prescriptions  Pending Prescriptions Disp Refills  . amLODipine (NORVASC) 2.5 MG tablet [Pharmacy Med Name: AMLODIPINE  2.5MG   TAB] 90 tablet 3    Sig: TAKE 1 TABLET BY MOUTH AT  BEDTIME     Cardiovascular:  Calcium Channel Blockers Failed - 09/21/2019 10:12 PM      Failed - Last BP in normal range    BP Readings from Last 1 Encounters:  07/07/19 (!) 145/89         Passed - Valid encounter within last 6 months    Recent Outpatient Visits          2 months ago Annual physical exam   [facility] F[facility] Malling M, [place]   8 months ago Controlled type 2 diabetes mellitus with stage 4 chronic kidney disease, without long-term current use of insulin [facility])   [facility], [place], [place]   12 months ago Essential (primary) hypertension   [employer], Clearnce Sorrel, PA-C   1 year ago Transition of care performed with sharing of clinical summary   [employer], Clearnce Sorrel, PA-C   1 year ago Annual physical exam   [facility], Clearnce Sorrel, [place]      Future Appointments            In 3 months [place], Clearnce Sorrel, PA-C [facility], [place]   In 6 months [place], Ronda Fairly, MD [facility]

## 2019-10-20 DIAGNOSIS — H2511 Age-related nuclear cataract, right eye: Secondary | ICD-10-CM | POA: Diagnosis not present

## 2019-10-20 DIAGNOSIS — H524 Presbyopia: Secondary | ICD-10-CM | POA: Diagnosis not present

## 2019-10-20 DIAGNOSIS — E089 Diabetes mellitus due to underlying condition without complications: Secondary | ICD-10-CM | POA: Diagnosis not present

## 2019-10-20 DIAGNOSIS — E119 Type 2 diabetes mellitus without complications: Secondary | ICD-10-CM | POA: Diagnosis not present

## 2019-10-24 ENCOUNTER — Telehealth: Payer: Self-pay

## 2019-10-24 DIAGNOSIS — R928 Other abnormal and inconclusive findings on diagnostic imaging of breast: Secondary | ICD-10-CM

## 2019-10-24 NOTE — Addendum Note (Signed)
Addended by: Mar Daring on: 10/24/2019 03:01 PM   Modules accepted: Orders

## 2019-10-24 NOTE — Telephone Encounter (Signed)
It appears this is supposed to be diagnostic 6 mo f/u   Copied from [place] [id number]. Topic: General - Inquiry >> Oct 24, 2019  9:30 AM Virl Axe D wrote: Reason for CRM: Pt stated she received a letter from [facility] that stated she needs to schedule her routine exam but they told her PCP needs to send order first. Please advise.

## 2019-10-24 NOTE — Telephone Encounter (Signed)
Left diagnostic mammo ordered

## 2019-10-31 ENCOUNTER — Other Ambulatory Visit: Payer: Self-pay | Admitting: Physician Assistant

## 2019-10-31 ENCOUNTER — Encounter: Payer: Self-pay | Admitting: Physician Assistant

## 2019-10-31 DIAGNOSIS — N6489 Other specified disorders of breast: Secondary | ICD-10-CM

## 2019-10-31 DIAGNOSIS — R928 Other abnormal and inconclusive findings on diagnostic imaging of breast: Secondary | ICD-10-CM

## 2019-11-01 ENCOUNTER — Other Ambulatory Visit: Payer: Self-pay | Admitting: Physician Assistant

## 2019-11-01 DIAGNOSIS — E7800 Pure hypercholesterolemia, unspecified: Secondary | ICD-10-CM

## 2019-11-01 NOTE — Telephone Encounter (Signed)
Requested Prescriptions  Pending Prescriptions Disp Refills  . pravastatin (PRAVACHOL) 20 MG tablet [Pharmacy Med Name: PRAVASTATIN SOD 20MG  TABLET] 90 tablet 2    Sig: TAKE 1 TABLET BY MOUTH AT  BEDTIME     Cardiovascular:  Antilipid - Statins Failed - 11/01/2019 10:31 PM      Failed - LDL in normal range and within 360 days    LDL Chol Calc (NIH)  Date Value Ref Range Status  07/07/2019 114 (H) 0 - 99 mg/dL Final         Failed - Triglycerides in normal range and within 360 days    Triglycerides  Date Value Ref Range Status  07/07/2019 188 (H) 0 - 149 mg/dL Final         Passed - Total Cholesterol in normal range and within 360 days    Cholesterol, Total  Date Value Ref Range Status  07/07/2019 193 100 - 199 mg/dL Final         Passed - HDL in normal range and within 360 days    HDL  Date Value Ref Range Status  07/07/2019 46 >39 mg/dL Final         Passed - Patient is not pregnant      Passed - Valid encounter within last 12 months    Recent Outpatient Visits          3 months ago Annual physical exam   [facility] Fenton Malling M, PA-C   10 months ago Controlled type 2 diabetes mellitus with stage 4 chronic kidney disease, without long-term current use of insulin [facility])   [facility] [place], Clearnce Sorrel, [place]   1 year ago Essential (primary) hypertension   [employer], Clearnce Sorrel, PA-C   1 year ago Transition of care performed with sharing of clinical summary   [place], Clearnce Sorrel, [place]   2 years ago Annual physical exam   [place], Clearnce Sorrel, [place]      Future Appointments            In 2 months Burnette, Clearnce Sorrel, PA-C [employer], [place]   In 5 months [place], Ronda Fairly, MD [facility]

## 2019-11-04 DIAGNOSIS — N184 Chronic kidney disease, stage 4 (severe): Secondary | ICD-10-CM | POA: Diagnosis not present

## 2019-11-04 DIAGNOSIS — D631 Anemia in chronic kidney disease: Secondary | ICD-10-CM | POA: Diagnosis not present

## 2019-11-10 DIAGNOSIS — N184 Chronic kidney disease, stage 4 (severe): Secondary | ICD-10-CM | POA: Diagnosis not present

## 2019-11-10 DIAGNOSIS — N2581 Secondary hyperparathyroidism of renal origin: Secondary | ICD-10-CM | POA: Diagnosis not present

## 2019-11-10 DIAGNOSIS — I10 Essential (primary) hypertension: Secondary | ICD-10-CM | POA: Diagnosis not present

## 2019-11-10 DIAGNOSIS — N200 Calculus of kidney: Secondary | ICD-10-CM | POA: Diagnosis not present

## 2019-11-10 DIAGNOSIS — E1121 Type 2 diabetes mellitus with diabetic nephropathy: Secondary | ICD-10-CM | POA: Diagnosis not present

## 2019-11-26 ENCOUNTER — Ambulatory Visit
Admission: RE | Admit: 2019-11-26 | Discharge: 2019-11-26 | Disposition: A | Discharge status: Home / Self Care | Payer: Medicare Other | Source: Ambulatory Visit | Attending: Physician Assistant | Admitting: Physician Assistant

## 2019-11-26 DIAGNOSIS — R928 Other abnormal and inconclusive findings on diagnostic imaging of breast: Secondary | ICD-10-CM

## 2019-11-26 DIAGNOSIS — N6489 Other specified disorders of breast: Secondary | ICD-10-CM

## 2019-11-26 DIAGNOSIS — R922 Inconclusive mammogram: Secondary | ICD-10-CM | POA: Diagnosis not present

## 2019-11-27 ENCOUNTER — Telehealth: Payer: Self-pay

## 2019-11-27 NOTE — Telephone Encounter (Signed)
[employer]   Result Notes and Comments to Patient Comment seen by patient Debra Paul on 11/27/2019 10:31 AM EDT

## 2019-11-27 NOTE — Telephone Encounter (Signed)
-----   Message from Mar Daring, [place] sent at 11/27/2019  8:00 AM EDT ----- Mammogram shows stable, most likely benign, lesion in the left breast. Recommend 6 month bilat diagnostic mammogram.

## 2020-01-08 ENCOUNTER — Other Ambulatory Visit: Payer: Self-pay

## 2020-01-08 ENCOUNTER — Ambulatory Visit (INDEPENDENT_AMBULATORY_CARE_PROVIDER_SITE_OTHER): Payer: Medicare Other | Admitting: Physician Assistant

## 2020-01-08 ENCOUNTER — Encounter: Payer: Self-pay | Admitting: Physician Assistant

## 2020-01-08 VITALS — BP 136/84 | HR 70 | Temp 97.0°F | Resp 16 | Wt 237.0 lb

## 2020-01-08 DIAGNOSIS — N184 Chronic kidney disease, stage 4 (severe): Secondary | ICD-10-CM

## 2020-01-08 DIAGNOSIS — E1121 Type 2 diabetes mellitus with diabetic nephropathy: Secondary | ICD-10-CM | POA: Diagnosis not present

## 2020-01-08 DIAGNOSIS — E7800 Pure hypercholesterolemia, unspecified: Secondary | ICD-10-CM | POA: Diagnosis not present

## 2020-01-08 DIAGNOSIS — E119 Type 2 diabetes mellitus without complications: Secondary | ICD-10-CM | POA: Diagnosis not present

## 2020-01-08 DIAGNOSIS — I10 Essential (primary) hypertension: Secondary | ICD-10-CM

## 2020-01-08 DIAGNOSIS — K130 Diseases of lips: Secondary | ICD-10-CM

## 2020-01-08 LAB — POCT GLYCOSYLATED HEMOGLOBIN (HGB A1C)
Est. average glucose Bld gHb Est-mCnc: 123
Hemoglobin A1C: 5.9 % — AB (ref 4.0–5.6)

## 2020-01-08 LAB — POCT UA - MICROALBUMIN: Microalbumin Ur, POC: 100 mg/L

## 2020-01-08 NOTE — Assessment & Plan Note (Signed)
Offerred patient referral to ENT, chooses to wait on dental appointment in August. Will call if worsen.

## 2020-01-08 NOTE — Assessment & Plan Note (Addendum)
Well controlled Continue current medications F/u in 6 months with CPE She will have labs done with Dr.Singh at her follow up appointment.

## 2020-01-08 NOTE — Patient Instructions (Signed)
Diabetes Mellitus and Foot Care Foot care is an important part of your health, especially when you have diabetes. Diabetes may cause you to have problems because of poor blood flow (circulation) to your feet and legs, which can cause your skin to:  Become thinner and drier.  Break more easily.  Heal more slowly.  Peel and crack. You may also have nerve damage (neuropathy) in your legs and feet, causing decreased feeling in them. This means that you may not notice minor injuries to your feet that could lead to more serious problems. Noticing and addressing any potential problems early is the best way to prevent future foot problems. How to care for your feet Foot hygiene  Wash your feet daily with warm water and mild soap. Do not use hot water. Then, pat your feet and the areas between your toes until they are completely dry. Do not soak your feet as this can dry your skin.  Trim your toenails straight across. Do not dig under them or around the cuticle. File the edges of your nails with an emery board or nail file.  Apply a moisturizing lotion or petroleum jelly to the skin on your feet and to dry, brittle toenails. Use lotion that does not contain alcohol and is unscented. Do not apply lotion between your toes. Shoes and socks  Wear clean socks or stockings every day. Make sure they are not too tight. Do not wear knee-high stockings since they may decrease blood flow to your legs.  Wear shoes that fit properly and have enough cushioning. Always look in your shoes before you put them on to be sure there are no objects inside.  To break in new shoes, wear them for just a few hours a day. This prevents injuries on your feet. Wounds, scrapes, corns, and calluses  Check your feet daily for blisters, cuts, bruises, sores, and redness. If you cannot see the bottom of your feet, use a mirror or ask someone for help.  Do not cut corns or calluses or try to remove them with medicine.  If you  find a minor scrape, cut, or break in the skin on your feet, keep it and the skin around it clean and dry. You may clean these areas with mild soap and water. Do not clean the area with peroxide, alcohol, or iodine.  If you have a wound, scrape, corn, or callus on your foot, look at it several times a day to make sure it is healing and not infected. Check for: ? Redness, swelling, or pain. ? Fluid or blood. ? Warmth. ? Pus or a bad smell. General instructions  Do not cross your legs. This may decrease blood flow to your feet.  Do not use heating pads or hot water bottles on your feet. They may burn your skin. If you have lost feeling in your feet or legs, you may not know this is happening until it is too late.  Protect your feet from hot and cold by wearing shoes, such as at the beach or on hot pavement.  Schedule a complete foot exam at least once a year (annually) or more often if you have foot problems. If you have foot problems, report any cuts, sores, or bruises to your health care provider immediately. Contact a health care provider if:  You have a medical condition that increases your risk of infection and you have any cuts, sores, or bruises on your feet.  You have an injury that is not   healing.  You have redness on your legs or feet.  You feel burning or tingling in your legs or feet.  You have pain or cramps in your legs and feet.  Your legs or feet are numb.  Your feet always feel cold.  You have pain around a toenail. Get help right away if:  You have a wound, scrape, corn, or callus on your foot and: ? You have pain, swelling, or redness that gets worse. ? You have fluid or blood coming from the wound, scrape, corn, or callus. ? Your wound, scrape, corn, or callus feels warm to the touch. ? You have pus or a bad smell coming from the wound, scrape, corn, or callus. ? You have a fever. ? You have a red line going up your leg. Summary  Check your feet every day  for cuts, sores, red spots, swelling, and blisters.  Moisturize feet and legs daily.  Wear shoes that fit properly and have enough cushioning.  If you have foot problems, report any cuts, sores, or bruises to your health care provider immediately.  Schedule a complete foot exam at least once a year (annually) or more often if you have foot problems. This information is not intended to replace advice given to you by your health care provider. Make sure you discuss any questions you have with your health care provider. Document Revised: 03/12/2019 Document Reviewed: 07/21/2016 Elsevier Patient Education  2020 Elsevier Inc.  

## 2020-01-08 NOTE — Assessment & Plan Note (Signed)
Stable.  Continue current medication

## 2020-01-08 NOTE — Assessment & Plan Note (Addendum)
Stable with A1C 5.9 today. Last A1C was 5.7 Continue current medications UTD on vaccines, eye exam, foot exam On Statin Patient to follow up in 6 months

## 2020-01-08 NOTE — Assessment & Plan Note (Addendum)
Stable. Followed by Dr. Candiss Norse, Nephrology.She will have labs done with Dr.Singh at her follow up appointment.

## 2020-01-08 NOTE — Progress Notes (Signed)
I,Joseline E Rosas,acting as a scribe for [employer], PA-C.,have documented all relevant documentation on the behalf of Mar Daring, PA-C,as directed by  Mar Daring, PA-C while in the presence of Mar Daring, [place].  Established patient visit   Patient: Debra Paul   DOB: 1951/06/04   69 y.o. Female  MRN: [id number] Visit Date: 01/08/2020  Today's healthcare provider: Mar Daring, PA-C   Chief Complaint  Patient presents with  . Follow-up   Subjective    HPI  Diabetes Mellitus Type II, Follow-up  Lab Results  Component Value Date   HGBA1C 5.9 (A) 01/08/2020   HGBA1C 5.7 (H) 07/07/2019   HGBA1C 6.0 (A) 12/30/2018   Wt Readings from Last 3 Encounters:  01/08/20 237 lb (107.5 kg)  07/07/19 231 lb (104.8 kg)  04/16/19 225 lb (102.1 kg)   Last seen for diabetes 6 months ago.  Management since then includes no changes.Continue Actos 15mg  She reports excellent compliance with treatment. She is not having side effects.  Symptoms: No fatigue No foot ulcerations  No appetite changes No nausea  No paresthesia of the feet  No polydipsia  No polyuria No visual disturbances   No vomiting     Home blood sugar records: fasting range: 115  Episodes of hypoglycemia? No    Current insulin regiment: none Most Recent Eye Exam:  UTD 10/2019 Current exercise: none Current diet habits: well balanced  Pertinent Labs: Lab Results  Component Value Date   CHOL 193 07/07/2019   HDL 46 07/07/2019   LDLCALC 114 (H) 07/07/2019   TRIG 188 (H) 07/07/2019   CHOLHDL 4.2 07/07/2019   Lab Results  Component Value Date   NA 138 06/22/2018   K 3.8 06/22/2018   CREATININE 3.04 (H) 06/22/2018   GFRNONAA 15 (L) 06/22/2018   GFRAA 18 (L) 06/22/2018   GLUCOSE 126 (H) 06/22/2018     ---------------------------------------------------------------------------------------------------  Hypertension, follow-up  BP Readings from Last 3  Encounters:  01/08/20 136/84  07/07/19 (!) 145/89  04/16/19 135/82   Wt Readings from Last 3 Encounters:  01/08/20 237 lb (107.5 kg)  07/07/19 231 lb (104.8 kg)  04/16/19 225 lb (102.1 kg)     She was last seen for hypertension 6 months ago.  BP at that visit was 145/89. Management since that visit includes continue same medications.  She reports excellent compliance with treatment. She is not having side effects.  She does not smoke.  Use of agents associated with hypertension: none.   Outside blood pressures are not being checked. Symptoms: No chest pain No chest pressure  No palpitations No syncope  No dyspnea No orthopnea  No paroxysmal nocturnal dyspnea No lower extremity edema   Pertinent labs: Lab Results  Component Value Date   CHOL 193 07/07/2019   HDL 46 07/07/2019   LDLCALC 114 (H) 07/07/2019   TRIG 188 (H) 07/07/2019   CHOLHDL 4.2 07/07/2019   Lab Results  Component Value Date   NA 138 06/22/2018   K 3.8 06/22/2018   CREATININE 3.04 (H) 06/22/2018   GFRNONAA 15 (L) 06/22/2018   GFRAA 18 (L) 06/22/2018   GLUCOSE 126 (H) 06/22/2018     The 10-year ASCVD risk score Mikey Bussing DC Jr., et al., 2013) is: 21.7%   ---------------------------------------------------------------------------------------------- She also c/o bump on the inside lower left lip. She went to the dentist on 08/18/19 and she pointed out to her. It doesn't hurts.  Patient Active Problem List  Diagnosis Date Noted  . Mucocele of lower lip 01/08/2020  . Primary nonessential cutis verticis gyrata 07/08/2019  . Parathyroid disorder ([place])   . Impingement syndrome of shoulder region 04/16/2019  . Shoulder pain 04/16/2019  . Stiffness of shoulder joint 04/16/2019  . History of sepsis 06/18/2018  . Guaiac positive stools 11/20/2016  . Hypocitraturia 08/21/2016  . Anemia 10/13/2015  . Hematuria 10/11/2015  . Calculus of kidney 02/10/2015  . Adult BMI 30+ 11/23/2014  . CKD (chronic kidney  disease) stage 4, GFR 15-29 ml/min (HCC) 11/23/2014  . Colon polyp 11/23/2014  . Controlled type 2 diabetes mellitus with diabetic nephropathy ([place]) 11/23/2014  . Fatty infiltration of liver 11/23/2014  . Herpes zona 11/23/2014  . Hypercholesteremia 11/23/2014  . Disordered sleep 11/23/2014  . Essential (primary) hypertension 11/23/2014   Past Medical History:  Diagnosis Date  . Diabetes mellitus without complication ([place])   . GERD (gastroesophageal reflux disease)   . Hypercholesteremia   . Kidney stones    Takes lisinopril for kidney protection  . Kidney stones   . Parathyroid disorder ([place])   . Sleep apnea    does not use CPAP  . Vertigo    in past       Medications: Outpatient Medications Prior to Visit  Medication Sig  . acetaminophen (TYLENOL) 325 MG tablet Take 2 tablets (650 mg total) by mouth every 6 (six) hours as needed for mild pain (or Fever >/= 101).  Marland Kitchen amLODipine (NORVASC) 2.5 MG tablet TAKE 1 TABLET BY MOUTH AT  BEDTIME  . ferrous sulfate 325 (65 FE) MG tablet Take 325 mg by mouth daily.   . Multiple Vitamin (MULTI-VITAMIN DAILY) TABS daily.   Marland Kitchen OVER THE COUNTER MEDICATION   . pioglitazone (ACTOS) 15 MG tablet Take 15 mg by mouth daily.  . pravastatin (PRAVACHOL) 20 MG tablet TAKE 1 TABLET BY MOUTH AT  BEDTIME  . sodium bicarbonate 650 MG tablet Take 650 mg by mouth 2 (two) times daily.   No facility-administered medications prior to visit.    Review of Systems  Last CBC Lab Results  Component Value Date   WBC 8.5 07/07/2019   HGB 10.5 (L) 07/07/2019   HCT 32.9 (L) 07/07/2019   MCV 92 07/07/2019   MCH 29.2 07/07/2019   RDW 13.1 07/07/2019   PLT 154 49/70/2637   Last metabolic panel Lab Results  Component Value Date   GLUCOSE 126 (H) 06/22/2018   NA 138 06/22/2018   K 3.8 06/22/2018   CL 103 06/22/2018   CO2 28 06/22/2018   BUN 42 (H) 06/22/2018   CREATININE 3.04 (H) 06/22/2018   GFRNONAA 15 (L) 06/22/2018   GFRAA 18 (L) 06/22/2018    CALCIUM 7.2 (L) 06/22/2018   PHOS 4.3 06/22/2018   PROT 6.4 07/07/2019   ALBUMIN 4.3 07/07/2019   LABGLOB 2.4 10/17/2017   AGRATIO 1.8 10/17/2017   BILITOT 0.2 07/07/2019   ALKPHOS 109 07/07/2019   AST 21 07/07/2019   ALT 24 07/07/2019   ANIONGAP 7 06/22/2018      Objective    BP 136/84 (BP Location: Left Arm, Patient Position: Sitting, Cuff Size: Large)   Pulse 70   Temp (!) 97 F (36.1 C) (Temporal)   Resp 16   Wt 237 lb (107.5 kg)   BMI 35.00 kg/m  BP Readings from Last 3 Encounters:  01/08/20 136/84  07/07/19 (!) 145/89  04/16/19 135/82   Wt Readings from Last 3 Encounters:  01/08/20 237 lb (107.5 kg)  07/07/19 231 lb (104.8 kg)  04/16/19 225 lb (102.1 kg)      Physical Exam Vitals reviewed.  Constitutional:      General: She is not in acute distress.    Appearance: Normal appearance. She is obese. She is not ill-appearing.  HENT:     Head: Normocephalic and atraumatic.     Mouth/Throat:     Mouth: Mucous membranes are moist.     Pharynx: No oropharyngeal exudate.     Comments: Oral mucocele on left lateral lower inner lip Eyes:     General: No scleral icterus.    Extraocular Movements: Extraocular movements intact.  Cardiovascular:     Rate and Rhythm: Normal rate and regular rhythm.     Pulses: Normal pulses.     Heart sounds: Normal heart sounds.  Pulmonary:     Effort: Pulmonary effort is normal.     Breath sounds: Normal breath sounds.  Musculoskeletal:     Cervical back: Normal range of motion and neck supple.     Right lower leg: No edema.     Left lower leg: No edema.  Skin:    General: Skin is warm and dry.  Neurological:     General: No focal deficit present.     Mental Status: She is alert and oriented to person, place, and time. Mental status is at baseline.  Psychiatric:        Mood and Affect: Mood normal.        Behavior: Behavior normal.     Diabetic Foot Exam - Simple   Simple Foot Form Diabetic Foot exam was performed with  the following findings: Yes 01/08/2020 12:38 PM  Visual Inspection No deformities, no ulcerations, no other skin breakdown bilaterally: Yes Sensation Testing Intact to touch and monofilament testing bilaterally: Yes Pulse Check Posterior Tibialis and Dorsalis pulse intact bilaterally: Yes Comments     Results for orders placed or performed in visit on 01/08/20  POCT glycosylated hemoglobin (Hb A1C)  Result Value Ref Range   Hemoglobin A1C 5.9 (A) 4.0 - 5.6 %   Est. average glucose Bld gHb Est-mCnc 123   POCT UA - Microalbumin  Result Value Ref Range   Microalbumin Ur, POC 100 mg/L    Assessment & Plan     Problem List Items Addressed This Visit      Cardiovascular and Mediastinum   Essential (primary) hypertension    Well controlled Continue current medications F/u in 6 months with CPE She will have labs done with Dr.Singh at her follow up appointment.        Digestive   Mucocele of lower lip    Offerred patient referral to ENT, chooses to wait on dental appointment in August. Will call if worsen.        Endocrine   Controlled type 2 diabetes mellitus with diabetic nephropathy (HCC)    Stable with A1C 5.9 today. Last A1C was 5.7 Continue current medications UTD on vaccines, eye exam, foot exam On Statin Patient to follow up in 6 months        Genitourinary   CKD (chronic kidney disease) stage 4, GFR 15-29 ml/min (HCC)    Stable. Followed by Dr. Candiss Norse, Nephrology.She will have labs done with Dr.Singh at her follow up appointment.      Relevant Orders   POCT UA - Microalbumin (Completed)     Other   Hypercholesteremia    Stable. Continue current medication.       Other Visit Diagnoses  Type 2 diabetes mellitus without complication, without long-term current use of insulin (HCC)    -  Primary   Relevant Orders   POCT glycosylated hemoglobin (Hb A1C) (Completed)   POCT UA - Microalbumin (Completed)       Return in about 6 months (around 07/10/2020)  for CPE.      Reynolds Bowl, PA-C, have reviewed all documentation for this visit. The documentation on 01/08/20 for the exam, diagnosis, procedures, and orders are all accurate and complete.   Rubye Beach  [facility] [phone] (phone) [phone] (fax)  [place]

## 2020-02-15 ENCOUNTER — Other Ambulatory Visit: Payer: Self-pay | Admitting: Physician Assistant

## 2020-02-16 NOTE — Telephone Encounter (Signed)
Requested medication (s) are due for refill today: no  Requested medication (s) are on the active medication list: yes  Future visit scheduled: yes  Notes to clinic: review for refill Last filled by historical provider    Requested Prescriptions  Pending Prescriptions Disp Refills   pioglitazone (ACTOS) 15 MG tablet [Pharmacy Med Name: PIOGLITAZONE  15MG   TAB] 90 tablet 3    Sig: TAKE 1 TABLET BY MOUTH  DAILY      Endocrinology:  Diabetes - Glitazones - pioglitazone Passed - 02/15/2020 10:42 PM      Passed - HBA1C is between 0 and 7.9 and within 180 days    Hemoglobin A1C  Date Value Ref Range Status  01/08/2020 5.9 (A) 4.0 - 5.6 % Final   Hgb A1c MFr Bld  Date Value Ref Range Status  07/07/2019 5.7 (H) 4.8 - 5.6 % Final    Comment:             Prediabetes: 5.7 - 6.4          Diabetes: >6.4          Glycemic control for adults with diabetes: <7.0           Passed - Valid encounter within last 6 months    Recent Outpatient Visits           1 month ago Type 2 diabetes mellitus without complication, without long-term current use of insulin [facility])   [facility] [place], [place], [place]   7 months ago Annual physical exam   [facility] [place], [place], [place]   1 year ago Controlled type 2 diabetes mellitus with stage 4 chronic kidney disease, without long-term current use of insulin [facility])   [place], Clearnce Sorrel, [place]   1 year ago Essential (primary) hypertension   [employer], Clearnce Sorrel, PA-C   1 year ago Transition of care performed with sharing of clinical summary   [place], Clearnce Sorrel, [place]       Future Appointments             In 1 month Stoioff, Ronda Fairly, MD [facility]   In 4 months Marlyn Corporal, Clearnce Sorrel, PA-C [employer], [place]

## 2020-02-27 ENCOUNTER — Encounter: Payer: Self-pay | Admitting: Physician Assistant

## 2020-03-09 DIAGNOSIS — N184 Chronic kidney disease, stage 4 (severe): Secondary | ICD-10-CM | POA: Diagnosis not present

## 2020-03-16 ENCOUNTER — Other Ambulatory Visit: Payer: Self-pay | Admitting: Nephrology

## 2020-03-16 ENCOUNTER — Other Ambulatory Visit (HOSPITAL_COMMUNITY): Payer: Self-pay | Admitting: Nephrology

## 2020-03-16 ENCOUNTER — Other Ambulatory Visit: Payer: Self-pay

## 2020-03-16 ENCOUNTER — Ambulatory Visit
Admission: RE | Admit: 2020-03-16 | Discharge: 2020-03-16 | Disposition: A | Discharge status: Home / Self Care | Payer: Medicare Other | Source: Ambulatory Visit | Attending: Nephrology | Admitting: Nephrology

## 2020-03-16 DIAGNOSIS — N184 Chronic kidney disease, stage 4 (severe): Secondary | ICD-10-CM | POA: Diagnosis not present

## 2020-03-16 DIAGNOSIS — N179 Acute kidney failure, unspecified: Secondary | ICD-10-CM | POA: Insufficient documentation

## 2020-03-16 DIAGNOSIS — I10 Essential (primary) hypertension: Secondary | ICD-10-CM | POA: Diagnosis not present

## 2020-03-16 DIAGNOSIS — E1122 Type 2 diabetes mellitus with diabetic chronic kidney disease: Secondary | ICD-10-CM | POA: Diagnosis not present

## 2020-03-16 DIAGNOSIS — N281 Cyst of kidney, acquired: Secondary | ICD-10-CM | POA: Diagnosis not present

## 2020-03-16 DIAGNOSIS — R600 Localized edema: Secondary | ICD-10-CM | POA: Diagnosis not present

## 2020-03-16 DIAGNOSIS — Z87442 Personal history of urinary calculi: Secondary | ICD-10-CM | POA: Insufficient documentation

## 2020-03-16 DIAGNOSIS — R8290 Unspecified abnormal findings in urine: Secondary | ICD-10-CM | POA: Diagnosis not present

## 2020-03-16 DIAGNOSIS — N200 Calculus of kidney: Secondary | ICD-10-CM | POA: Diagnosis not present

## 2020-03-31 DIAGNOSIS — N179 Acute kidney failure, unspecified: Secondary | ICD-10-CM | POA: Diagnosis not present

## 2020-03-31 DIAGNOSIS — N184 Chronic kidney disease, stage 4 (severe): Secondary | ICD-10-CM | POA: Diagnosis not present

## 2020-04-05 DIAGNOSIS — R600 Localized edema: Secondary | ICD-10-CM | POA: Diagnosis not present

## 2020-04-05 DIAGNOSIS — N179 Acute kidney failure, unspecified: Secondary | ICD-10-CM | POA: Diagnosis not present

## 2020-04-05 DIAGNOSIS — E1122 Type 2 diabetes mellitus with diabetic chronic kidney disease: Secondary | ICD-10-CM | POA: Diagnosis not present

## 2020-04-05 DIAGNOSIS — N184 Chronic kidney disease, stage 4 (severe): Secondary | ICD-10-CM | POA: Diagnosis not present

## 2020-04-05 DIAGNOSIS — I10 Essential (primary) hypertension: Secondary | ICD-10-CM | POA: Diagnosis not present

## 2020-04-13 ENCOUNTER — Other Ambulatory Visit: Payer: Self-pay | Admitting: Physician Assistant

## 2020-04-13 DIAGNOSIS — R928 Other abnormal and inconclusive findings on diagnostic imaging of breast: Secondary | ICD-10-CM

## 2020-04-13 DIAGNOSIS — N6489 Other specified disorders of breast: Secondary | ICD-10-CM

## 2020-04-13 DIAGNOSIS — Z1231 Encounter for screening mammogram for malignant neoplasm of breast: Secondary | ICD-10-CM

## 2020-04-14 ENCOUNTER — Ambulatory Visit
Admission: RE | Admit: 2020-04-14 | Discharge: 2020-04-14 | Disposition: A | Discharge status: Home / Self Care | Payer: Medicare Other | Source: Ambulatory Visit | Attending: Urology | Admitting: Urology

## 2020-04-14 ENCOUNTER — Encounter: Payer: Self-pay | Admitting: Urology

## 2020-04-14 ENCOUNTER — Ambulatory Visit: Payer: Medicare Other | Admitting: Urology

## 2020-04-14 ENCOUNTER — Ambulatory Visit
Admission: RE | Admit: 2020-04-14 | Discharge: 2020-04-14 | Disposition: A | Discharge status: Home / Self Care | Payer: Medicare Other | Attending: Urology | Admitting: Urology

## 2020-04-14 ENCOUNTER — Other Ambulatory Visit: Payer: Self-pay

## 2020-04-14 VITALS — BP 136/83 | HR 64 | Ht 69.0 in | Wt 238.0 lb

## 2020-04-14 DIAGNOSIS — N200 Calculus of kidney: Secondary | ICD-10-CM | POA: Insufficient documentation

## 2020-04-14 DIAGNOSIS — M47816 Spondylosis without myelopathy or radiculopathy, lumbar region: Secondary | ICD-10-CM | POA: Diagnosis not present

## 2020-04-14 NOTE — Progress Notes (Signed)
04/14/2020 10:03 AM   Debra Paul July 05, 1950 [id number]  Referring provider: Mar Daring, PA-C [address] [place],  [place] [address]  Chief Complaint  Patient presents with  . Nephrolithiasis    Urologic history:  1.  Nephrolithiasis             -Left ureteroscopy 09/2015 11 mm UPJ calculus, ca ox mono             -Bilateral nonobstructing renal calculi             -Metabolic evaluation low urine volume, hypocitraturia, low pH  HPI: 69 y.o. female presents for annual follow-up of nephrolithiasis.   RUS 03/16/2020 ordered by nephrology remarkable for a 10 mm nonobstructing right lower pole calculus and a possible 17 mm left upper pole calculus.  15 mm simple cyst present on left  Denies flank or abdominal pain  Denies dysuria, gross hematuria  KUB performed today was reviewed and the right lower pole calcification has not changed significantly.  No left-sided calcifications are identified   PMH: Past Medical History:  Diagnosis Date  . Diabetes mellitus without complication ([place])   . GERD (gastroesophageal reflux disease)   . Hypercholesteremia   . Kidney stones    Takes lisinopril for kidney protection  . Kidney stones   . Parathyroid disorder ([place])   . Sleep apnea    does not use CPAP  . Vertigo    in past    Surgical History: Past Surgical History:  Procedure Laterality Date  . CATARACT EXTRACTION W/PHACO Left 05/14/2017   Procedure: CATARACT EXTRACTION PHACO AND INTRAOCULAR LENS PLACEMENT ([place]) LEFT DIABETIC;  Surgeon: Leandrew Koyanagi, MD;  Location: [facility];  Service: Ophthalmology;  Laterality: Left;  diabetic  . CYSTOSCOPY WITH URETEROSCOPY AND STENT PLACEMENT  09/29/2015   Dr. Bernardo Heater  . DIALYSIS/PERMA CATHETER INSERTION Left 06/21/2018   Procedure: DIALYSIS/PERMA CATHETER INSERTION;  Surgeon: Katha Cabal, MD;  Location: [facility];  Service: Cardiovascular;  Laterality: Left;  .  DIALYSIS/PERMA CATHETER REMOVAL N/A 07/18/2018   Procedure: DIALYSIS/PERMA CATHETER REMOVAL;  Surgeon: Algernon Huxley, MD;  Location: [facility];  Service: Cardiovascular;  Laterality: N/A;  . ESOPHAGOGASTRODUODENOSCOPY ENDOSCOPY    . LITHOTRIPSY  2002  . PARATHYROIDECTOMY  1990    Home Medications:  Allergies as of 04/14/2020      Reactions   Prilosec [omeprazole] Other (See Comments)   Reaction: unknown   Ciprofloxacin Rash      Medication List       Accurate as of April 14, 2020 10:03 AM. If you have any questions, ask your nurse or doctor.        acetaminophen 325 MG tablet Commonly known as: TYLENOL Take 2 tablets (650 mg total) by mouth every 6 (six) hours as needed for mild pain (or Fever >/= 101).   amLODipine 2.5 MG tablet Commonly known as: NORVASC TAKE 1 TABLET BY MOUTH AT  BEDTIME   ferrous sulfate 325 (65 FE) MG tablet Take 325 mg by mouth daily.   Multi-Vitamin Daily Tabs daily.   OVER THE COUNTER MEDICATION   pioglitazone 15 MG tablet Commonly known as: ACTOS TAKE 1 TABLET BY MOUTH  DAILY   pravastatin 20 MG tablet Commonly known as: PRAVACHOL TAKE 1 TABLET BY MOUTH AT  BEDTIME   sodium bicarbonate 650 MG tablet Take 650 mg by mouth 2 (two) times daily.       Allergies:  Allergies  Allergen  Reactions  . Prilosec [Omeprazole] Other (See Comments)    Reaction: unknown  . Ciprofloxacin Rash    Family History: Family History  Problem Relation Age of Onset  . Colon cancer Father   . Heart disease Mother   . Breast cancer Neg Hx     Social History:  reports that she has never smoked. She has never used smokeless tobacco. She reports that she does not drink alcohol and does not use drugs.   Physical Exam: BP 136/83 (BP Location: Left Arm, Patient Position: Sitting, Cuff Size: Normal)   Pulse 64   Ht 5\' 9"  (1.753 m)   Wt 238 lb (108 kg)   BMI 35.15 kg/m   Constitutional:  Alert and oriented, No acute distress. HEENT: [place]  AT, moist mucus membranes.  Trachea midline, no masses. Cardiovascular: No clubbing, cyanosis, or edema. Respiratory: Normal respiratory effort, no increased work of breathing.   Assessment & Plan:    1.  Nephrolithiasis  Stable right renal calculus  Renal ultrasound suggested a possible 17 mm left renal calculus  Recommend scheduling stone protocol CT abdomen pelvis  Will notify with results   Abbie Sons, [place] [address], [place],  [address] [phone]

## 2020-04-22 ENCOUNTER — Ambulatory Visit (INDEPENDENT_AMBULATORY_CARE_PROVIDER_SITE_OTHER): Payer: Medicare Other

## 2020-04-22 ENCOUNTER — Other Ambulatory Visit: Payer: Self-pay

## 2020-04-22 DIAGNOSIS — Z23 Encounter for immunization: Secondary | ICD-10-CM

## 2020-04-22 NOTE — Progress Notes (Signed)
Subjective:   Debra Paul is a 69 y.o. female who presents for Medicare Annual (Subsequent) preventive examination.  I connected with Roney Marion today by telephone and verified that I am speaking with the correct person using two identifiers. Location patient: home Location provider: work Persons participating in the virtual visit: patient, provider.   I discussed the limitations, risks, security and privacy concerns of performing an evaluation and management service by telephone and the availability of in person appointments. I also discussed with the patient that there may be a patient responsible charge related to this service. The patient expressed understanding and verbally consented to this telephonic visit.    Interactive audio and video telecommunications were attempted between this provider and patient, however failed, due to patient having technical difficulties OR patient did not have access to video capability.  We continued and completed visit with audio only.   Review of Systems    N/A  Cardiac Risk Factors include: advanced age (>70men, >33 women);diabetes mellitus;dyslipidemia;hypertension;obesity (BMI >30kg/m2)     Objective:    There were no vitals filed for this visit. There is no height or weight on file to calculate BMI.  Advanced Directives 04/26/2020 04/22/2019 07/23/2018 06/21/2018 06/17/2018 05/14/2017 10/12/2016  Does Patient Have a Medical Advance Directive? No No No No No No No  Would patient like information on creating a medical advance directive? No - Patient declined No - Patient declined No - Patient declined No - Patient declined - No - Patient declined -    Current Medications (verified) Outpatient Encounter Medications as of 04/26/2020  Medication Sig  . acetaminophen (TYLENOL) 325 MG tablet Take 2 tablets (650 mg total) by mouth every 6 (six) hours as needed for mild pain (or Fever >/= 101).  Marland Kitchen amLODipine (NORVASC) 2.5 MG  tablet TAKE 1 TABLET BY MOUTH AT  BEDTIME  . Cholecalciferol (VITAMIN D3) 50 MCG (2000 UT) capsule Take 2,000 Units by mouth daily.  . ferrous sulfate 325 (65 FE) MG tablet Take 325 mg by mouth daily.   . Multiple Vitamin (MULTI-VITAMIN DAILY) TABS daily.   Marland Kitchen OVER THE COUNTER MEDICATION   . pioglitazone (ACTOS) 15 MG tablet TAKE 1 TABLET BY MOUTH  DAILY  . pravastatin (PRAVACHOL) 20 MG tablet TAKE 1 TABLET BY MOUTH AT  BEDTIME  . sodium bicarbonate 650 MG tablet Take 1,300 mg by mouth 2 (two) times daily.   . [DISCONTINUED] pioglitazone (ACTOS) 15 MG tablet TAKE 1 TABLET BY MOUTH  DAILY   No facility-administered encounter medications on file as of 04/26/2020.    Allergies (verified) Prilosec [omeprazole] and Ciprofloxacin   History: Past Medical History:  Diagnosis Date  . Diabetes mellitus without complication ([place])   . GERD (gastroesophageal reflux disease)   . Hypercholesteremia   . Kidney stones    Takes lisinopril for kidney protection  . Kidney stones   . Parathyroid disorder ([place])   . Sleep apnea    does not use CPAP  . Vertigo    in past   Past Surgical History:  Procedure Laterality Date  . CATARACT EXTRACTION W/PHACO Left 05/14/2017   Procedure: CATARACT EXTRACTION PHACO AND INTRAOCULAR LENS PLACEMENT ([place]) LEFT DIABETIC;  Surgeon: Leandrew Koyanagi, MD;  Location: [facility];  Service: Ophthalmology;  Laterality: Left;  diabetic  . CYSTOSCOPY WITH URETEROSCOPY AND STENT PLACEMENT  09/29/2015   Dr. Bernardo Heater  . DIALYSIS/PERMA CATHETER INSERTION Left 06/21/2018   Procedure: DIALYSIS/PERMA CATHETER INSERTION;  Surgeon: Katha Cabal, MD;  Location:  [facility];  Service: Cardiovascular;  Laterality: Left;  . DIALYSIS/PERMA CATHETER REMOVAL N/A 07/18/2018   Procedure: DIALYSIS/PERMA CATHETER REMOVAL;  Surgeon: Algernon Huxley, MD;  Location: [facility];  Service: Cardiovascular;  Laterality: N/A;  . ESOPHAGOGASTRODUODENOSCOPY ENDOSCOPY      . LITHOTRIPSY  2002  . PARATHYROIDECTOMY  1990   Family History  Problem Relation Age of Onset  . Colon cancer Father   . Heart disease Mother   . Breast cancer Neg Hx    Social History   Socioeconomic History  . Marital status: Widowed    Spouse name: Not on file  . Number of children: 1  . Years of education: Not on file  . Highest education level: Some college, no degree  Occupational History  . Occupation: retired  Tobacco Use  . Smoking status: Never Smoker  . Smokeless tobacco: Never Used  Vaping Use  . Vaping Use: Never used  Substance and Sexual Activity  . Alcohol use: No    Comment: 2-3 times a year  . Drug use: No  . Sexual activity: Not on file  Other Topics Concern  . Not on file  Social History Narrative   Was legally separated at time of husbands death.   Social Determinants of Health   Financial Resource Strain: Low Risk   . Difficulty of Paying Living Expenses: Not hard at all  Food Insecurity: No Food Insecurity  . Worried About Charity fundraiser in the Last Year: Never true  . Ran Out of Food in the Last Year: Never true  Transportation Needs: No Transportation Needs  . Lack of Transportation (Medical): No  . Lack of Transportation (Non-Medical): No  Physical Activity: Inactive  . Days of Exercise per Week: 0 days  . Minutes of Exercise per Session: 0 min  Stress: No Stress Concern Present  . Feeling of Stress : Not at all  Social Connections: Moderately Integrated  . Frequency of Communication with Friends and Family: More than three times a week  . Frequency of Social Gatherings with Friends and Family: More than three times a week  . Attends Religious Services: More than 4 times per year  . Active Member of Clubs or Organizations: Yes  . Attends Archivist Meetings: More than 4 times per year  . Marital Status: Widowed    Tobacco Counseling Counseling given: Not Answered   Clinical Intake:  Pre-visit preparation  completed: Yes  Pain : No/denies pain     Nutritional Risks: None Diabetes: Yes  How often do you need to have someone help you when you read instructions, pamphlets, or other written materials from your doctor or pharmacy?: 1 - Never  Diabetic? Yes  Nutrition Risk Assessment:  Has the patient had any N/V/D within the last 2 months?  No  Does the patient have any non-healing wounds?  No  Has the patient had any unintentional weight loss or weight gain?  No   Diabetes:  Is the patient diabetic?  Yes  If diabetic, was a CBG obtained today?  No  Did the patient bring in their glucometer from home?  No  How often do you monitor your CBG's? Only checks as needed or in the clinic.   Financial Strains and Diabetes Management:  Are you having any financial strains with the device, your supplies or your medication? No .  Does the patient want to be seen by Chronic Care Management for management of their diabetes?  No  Would the patient like to be referred to a Nutritionist or for Diabetic Management?  No   Diabetic Exams:  Diabetic Eye Exam: Completed 10/20/19 Diabetic Foot Exam: Completed 01/08/20   Interpreter Needed?: No  Information entered by :: [facility], LPN   Activities of Daily Living In your present state of health, do you have any difficulty performing the following activities: 04/26/2020  Hearing? N  Vision? N  Difficulty concentrating or making decisions? N  Walking or climbing stairs? N  Dressing or bathing? N  Doing errands, shopping? N  Preparing Food and eating ? N  Using the Toilet? N  In the past six months, have you accidently leaked urine? N  Do you have problems with loss of bowel control? N  Managing your Medications? N  Managing your Finances? N  Housekeeping or managing your Housekeeping? N  Some recent data might be hidden    Patient Care Team: Mar Daring, PA-C as PCP - General (Family Medicine) Murlean Iba, MD  (Nephrology) Abbie Sons, MD (Urology) Bryson Ha, OD (Optometry) Fabio Bering, DMD as Referring Physician (Dentistry)  Indicate any recent Medical Services you may have received from other than [facility] providers in the past year (date may be approximate).     Assessment:   This is a routine wellness examination for [place].  Hearing/Vision screen No exam data present  Dietary issues and exercise activities discussed: Current Exercise Habits: The patient does not participate in regular exercise at present, Exercise limited by: None identified  Goals    . Exercise 3x per week (30 min per time)     Recommend to start walking 3 days a week for at least 30 minutes at a time.       Depression Screen PHQ 2/9 Scores 04/26/2020 04/22/2019 10/17/2017 10/12/2016 10/11/2015  PHQ - 2 Score 0 0 0 0 0  PHQ- 9 Score - - - 0 -    Fall Risk Fall Risk  04/26/2020 04/22/2019 10/17/2017 10/12/2016  Falls in the past year? 0 0 No No  Number falls in past yr: 0 0 - -  Injury with Fall? 0 0 - -    Any stairs in or around the home? Yes  If so, are there any without handrails? No  Home free of loose throw rugs in walkways, pet beds, electrical cords, etc? Yes  Adequate lighting in your home to reduce risk of falls? Yes   ASSISTIVE DEVICES UTILIZED TO PREVENT FALLS:  Life alert? No  Use of a cane, walker or w/c? No  Grab bars in the bathroom? No  Shower chair or bench in shower? No  Elevated toilet seat or a handicapped toilet? Yes    Cognitive Function:     6CIT Screen 04/26/2020  What Year? 0 points  What month? 0 points  What time? 0 points  Count back from 20 0 points  Months in reverse 0 points  Repeat phrase 0 points  Total Score 0    Immunizations Immunization History  Administered Date(s) Administered  . Fluad Quad(high Dose 65+) 04/01/2019, 04/22/2020  . Influenza, High Dose Seasonal PF 04/17/2018  . Influenza-Unspecified 04/08/2015  . PFIZER SARS-COV-2  Vaccination 08/06/2019, 08/27/2019  . Pneumococcal Conjugate-13 10/12/2016  . Pneumococcal Polysaccharide-23 10/17/2017  . Tdap 09/04/2011    TDAP status: Up to date Flu Vaccine status: Up to date Pneumococcal vaccine status: Up to date Covid-19 vaccine status: Completed vaccines   Qualifies for Shingles Vaccine? Yes  Zostavax completed No   Shingrix Completed?: No.    Education has been provided regarding the importance of this vaccine. Patient has been advised to call insurance company to determine out of pocket expense if they have not yet received this vaccine. Advised may also receive vaccine at local pharmacy or Health Dept. Verbalized acceptance and understanding.  Screening Tests Health Maintenance  Topic Date Due  . MAMMOGRAM  05/28/2020  . HEMOGLOBIN A1C  07/10/2020  . OPHTHALMOLOGY EXAM  10/19/2020  . COLONOSCOPY  12/06/2020  . FOOT EXAM  01/07/2021  . URINE MICROALBUMIN  01/07/2021  . TETANUS/TDAP  09/03/2021  . INFLUENZA VACCINE  Completed  . DEXA SCAN  Completed  . COVID-19 Vaccine  Completed  . Hepatitis C Screening  Completed  . PNA vac Low Risk Adult  Completed    Health Maintenance  There are no preventive care reminders to display for this patient.  Colorectal cancer screening: Completed 12/07/15. Repeat every 5 years Mammogram status: Completed 11/26/19. Repeat every 6 months. Scheduled 05/14/20. Bone Density status: Completed 11/23/16. Results reflect: Previous DEXA scan was normal. No repeat needed unless advised by a physician.  Lung Cancer Screening: (Low Dose CT Chest recommended if Age 71-80 years, 30 pack-year currently smoking OR have quit w/in 15years.) does not qualify.    Additional Screening:  Hepatitis C Screening: Up to date  Vision Screening: Recommended annual ophthalmology exams for early detection of glaucoma and other disorders of the eye. Is the patient up to date with their annual eye exam?  Yes  Who is the provider or what is the  name of the office in which the patient attends annual eye exams?  Dr Kerin Ransom If pt is not established with a provider, would they like to be referred to a provider to establish care? No  Dental Screening: Recommended annual dental exams for proper oral hygiene  Community Resource Referral / Chronic Care Management: CRR required this visit? No  CCM required this visit? No     Plan:     I have personally reviewed and noted the following in the patient's chart:   . Medical and social history . Use of alcohol, tobacco or illicit drugs  . Current medications and supplements . Functional ability and status . Nutritional status . Physical activity . Advanced directives . List of other physicians . Hospitalizations, surgeries, and ER visits in previous 12 months . Vitals . Screenings to include cognitive, depression, and falls . Referrals and appointments  In addition, I have reviewed and discussed with patient certain preventive protocols, quality metrics, and best practice recommendations. A written personalized care plan for preventive services as well as general preventive health recommendations were provided to patient.     Ashli Selders [place], [place]   [id number]   Nurse Notes: None.

## 2020-04-26 ENCOUNTER — Ambulatory Visit (INDEPENDENT_AMBULATORY_CARE_PROVIDER_SITE_OTHER): Payer: Medicare Other

## 2020-04-26 ENCOUNTER — Other Ambulatory Visit: Payer: Self-pay

## 2020-04-26 DIAGNOSIS — Z0000 Encounter for general adult medical examination without abnormal findings: Secondary | ICD-10-CM

## 2020-04-26 NOTE — Patient Instructions (Signed)
Debra Paul , Thank you for taking time to come for your Medicare Wellness Visit. I appreciate your ongoing commitment to your health goals. Please review the following plan we discussed and let me know if I can assist you in the future.   Screening recommendations/referrals: Colonoscopy: Up to date, due 12/2020 Mammogram: Up to date, scheduled for 05/14/20 Bone Density: Previous DEXA scan was normal. No repeat needed unless advised by a physician. Recommended yearly ophthalmology/optometry visit for glaucoma screening and checkup Recommended yearly dental visit for hygiene and checkup  Vaccinations: Influenza vaccine: Done 04/22/20 Pneumococcal vaccine: Completed series Tdap vaccine: Up to date, due 08/2021 Shingles vaccine: Shingrix discussed. Please contact your pharmacy for coverage information.     Advanced directives: Advance directive discussed with you today. Even though you declined this today please call our office should you change your mind and we can give you the proper paperwork for you to fill out.  Conditions/risks identified: Recommend to start back at the gym to exercise 3 days a week for at least 30 minutes at a time.   Next appointment: 07/12/20 @ 9:00 AM with [employer] 65 Years and Older, Female Preventive care refers to lifestyle choices and visits with your health care provider that can promote health and wellness. What does preventive care include?  A yearly physical exam. This is also called an annual well check.  Dental exams once or twice a year.  Routine eye exams. Ask your health care provider how often you should have your eyes checked.  Personal lifestyle choices, including:  Daily care of your teeth and gums.  Regular physical activity.  Eating a healthy diet.  Avoiding tobacco and drug use.  Limiting alcohol use.  Practicing safe sex.  Taking low-dose aspirin every day.  Taking vitamin and mineral  supplements as recommended by your health care provider. What happens during an annual well check? The services and screenings done by your health care provider during your annual well check will depend on your age, overall health, lifestyle risk factors, and family history of disease. Counseling  Your health care provider may ask you questions about your:  Alcohol use.  Tobacco use.  Drug use.  Emotional well-being.  Home and relationship well-being.  Sexual activity.  Eating habits.  History of falls.  Memory and ability to understand (cognition).  Work and work Statistician.  Reproductive health. Screening  You may have the following tests or measurements:  Height, weight, and BMI.  Blood pressure.  Lipid and cholesterol levels. These may be checked every 5 years, or more frequently if you are over 56 years old.  Skin check.  Lung cancer screening. You may have this screening every year starting at age 13 if you have a 30-pack-year history of smoking and currently smoke or have quit within the past 15 years.  Fecal occult blood test (FOBT) of the stool. You may have this test every year starting at age 84.  Flexible sigmoidoscopy or colonoscopy. You may have a sigmoidoscopy every 5 years or a colonoscopy every 10 years starting at age 43.  Hepatitis C blood test.  Hepatitis B blood test.  Sexually transmitted disease (STD) testing.  Diabetes screening. This is done by checking your blood sugar (glucose) after you have not eaten for a while (fasting). You may have this done every 1-3 years.  Bone density scan. This is done to screen for osteoporosis. You may have this done starting at age 71.  Mammogram. This may be done every 1-2 years. Talk to your health care provider about how often you should have regular mammograms. Talk with your health care provider about your test results, treatment options, and if necessary, the need for more tests. Vaccines  Your  health care provider may recommend certain vaccines, such as:  Influenza vaccine. This is recommended every year.  Tetanus, diphtheria, and acellular pertussis (Tdap, Td) vaccine. You may need a Td booster every 10 years.  Zoster vaccine. You may need this after age 51.  Pneumococcal 13-valent conjugate (PCV13) vaccine. One dose is recommended after age 88.  Pneumococcal polysaccharide (PPSV23) vaccine. One dose is recommended after age 26. Talk to your health care provider about which screenings and vaccines you need and how often you need them. This information is not intended to replace advice given to you by your health care provider. Make sure you discuss any questions you have with your health care provider. Document Released: 07/16/2015 Document Revised: 03/08/2016 Document Reviewed: 04/20/2015 Elsevier Interactive Patient Education  2017 [place] Prevention in the Home Falls can cause injuries. They can happen to people of all ages. There are many things you can do to make your home safe and to help prevent falls. What can I do on the outside of my home?  Regularly fix the edges of walkways and driveways and fix any cracks.  Remove anything that might make you trip as you walk through a door, such as a raised step or threshold.  Trim any bushes or trees on the path to your home.  Use bright outdoor lighting.  Clear any walking paths of anything that might make someone trip, such as rocks or tools.  Regularly check to see if handrails are loose or broken. Make sure that both sides of any steps have handrails.  Any raised decks and porches should have guardrails on the edges.  Have any leaves, snow, or ice cleared regularly.  Use sand or salt on walking paths during winter.  Clean up any spills in your garage right away. This includes oil or grease spills. What can I do in the bathroom?  Use night lights.  Install grab bars by the toilet and in the tub and  shower. Do not use towel bars as grab bars.  Use non-skid mats or decals in the tub or shower.  If you need to sit down in the shower, use a plastic, non-slip stool.  Keep the floor dry. Clean up any water that spills on the floor as soon as it happens.  Remove soap buildup in the tub or shower regularly.  Attach bath mats securely with double-sided non-slip rug tape.  Do not have throw rugs and other things on the floor that can make you trip. What can I do in the bedroom?  Use night lights.  Make sure that you have a light by your bed that is easy to reach.  Do not use any sheets or blankets that are too big for your bed. They should not hang down onto the floor.  Have a firm chair that has side arms. You can use this for support while you get dressed.  Do not have throw rugs and other things on the floor that can make you trip. What can I do in the kitchen?  Clean up any spills right away.  Avoid walking on wet floors.  Keep items that you use a lot in easy-to-reach places.  If you need to reach  something above you, use a strong step stool that has a grab bar.  Keep electrical cords out of the way.  Do not use floor polish or wax that makes floors slippery. If you must use wax, use non-skid floor wax.  Do not have throw rugs and other things on the floor that can make you trip. What can I do with my stairs?  Do not leave any items on the stairs.  Make sure that there are handrails on both sides of the stairs and use them. Fix handrails that are broken or loose. Make sure that handrails are as long as the stairways.  Check any carpeting to make sure that it is firmly attached to the stairs. Fix any carpet that is loose or worn.  Avoid having throw rugs at the top or bottom of the stairs. If you do have throw rugs, attach them to the floor with carpet tape.  Make sure that you have a light switch at the top of the stairs and the bottom of the stairs. If you do not  have them, ask someone to add them for you. What else can I do to help prevent falls?  Wear shoes that:  Do not have high heels.  Have rubber bottoms.  Are comfortable and fit you well.  Are closed at the toe. Do not wear sandals.  If you use a stepladder:  Make sure that it is fully opened. Do not climb a closed stepladder.  Make sure that both sides of the stepladder are locked into place.  Ask someone to hold it for you, if possible.  Clearly mark and make sure that you can see:  Any grab bars or handrails.  First and last steps.  Where the edge of each step is.  Use tools that help you move around (mobility aids) if they are needed. These include:  Canes.  Walkers.  Scooters.  Crutches.  Turn on the lights when you go into a dark area. Replace any light bulbs as soon as they burn out.  Set up your furniture so you have a clear path. Avoid moving your furniture around.  If any of your floors are uneven, fix them.  If there are any pets around you, be aware of where they are.  Review your medicines with your doctor. Some medicines can make you feel dizzy. This can increase your chance of falling. Ask your doctor what other things that you can do to help prevent falls. This information is not intended to replace advice given to you by your health care provider. Make sure you discuss any questions you have with your health care provider. Document Released: 04/15/2009 Document Revised: 11/25/2015 Document Reviewed: 07/24/2014 Elsevier Interactive Patient Education  2017 [employer].

## 2020-04-28 DIAGNOSIS — I10 Essential (primary) hypertension: Secondary | ICD-10-CM | POA: Diagnosis not present

## 2020-04-28 DIAGNOSIS — N184 Chronic kidney disease, stage 4 (severe): Secondary | ICD-10-CM | POA: Diagnosis not present

## 2020-04-30 ENCOUNTER — Ambulatory Visit
Admission: RE | Admit: 2020-04-30 | Discharge: 2020-04-30 | Disposition: A | Discharge status: Home / Self Care | Payer: Medicare Other | Source: Ambulatory Visit | Attending: Urology | Admitting: Urology

## 2020-04-30 ENCOUNTER — Other Ambulatory Visit: Payer: Self-pay

## 2020-04-30 DIAGNOSIS — N200 Calculus of kidney: Secondary | ICD-10-CM | POA: Diagnosis not present

## 2020-04-30 DIAGNOSIS — M47816 Spondylosis without myelopathy or radiculopathy, lumbar region: Secondary | ICD-10-CM | POA: Diagnosis not present

## 2020-04-30 DIAGNOSIS — M47814 Spondylosis without myelopathy or radiculopathy, thoracic region: Secondary | ICD-10-CM | POA: Diagnosis not present

## 2020-04-30 DIAGNOSIS — M5137 Other intervertebral disc degeneration, lumbosacral region: Secondary | ICD-10-CM | POA: Diagnosis not present

## 2020-05-02 ENCOUNTER — Encounter: Payer: Self-pay | Admitting: Urology

## 2020-05-24 ENCOUNTER — Ambulatory Visit
Admission: RE | Admit: 2020-05-24 | Discharge: 2020-05-24 | Disposition: A | Discharge status: Home / Self Care | Payer: Medicare Other | Source: Ambulatory Visit | Attending: Physician Assistant | Admitting: Physician Assistant

## 2020-05-24 ENCOUNTER — Other Ambulatory Visit: Payer: Self-pay

## 2020-05-24 DIAGNOSIS — R928 Other abnormal and inconclusive findings on diagnostic imaging of breast: Secondary | ICD-10-CM | POA: Diagnosis not present

## 2020-05-24 DIAGNOSIS — Z1231 Encounter for screening mammogram for malignant neoplasm of breast: Secondary | ICD-10-CM

## 2020-05-24 DIAGNOSIS — N6489 Other specified disorders of breast: Secondary | ICD-10-CM

## 2020-06-16 DIAGNOSIS — N184 Chronic kidney disease, stage 4 (severe): Secondary | ICD-10-CM | POA: Diagnosis not present

## 2020-07-09 ENCOUNTER — Other Ambulatory Visit: Payer: Self-pay | Admitting: Physician Assistant

## 2020-07-09 DIAGNOSIS — E7800 Pure hypercholesterolemia, unspecified: Secondary | ICD-10-CM

## 2020-07-09 NOTE — Progress Notes (Signed)
Complete physical exam   Patient: Debra Paul   DOB: Jun 11, 1951   70 y.o. Female  MRN: [id number] Visit Date: 07/12/2020  Today's healthcare provider: Margaretann Loveless, PA-C   Chief Complaint  Patient presents with  . Annual Exam   Subjective    Debra Paul is a 70 y.o. female who presents today for a complete physical exam.  She reports consuming a low sodium and well balanced diet. Gym/ health club routine includes senior silver sneaker. She generally feels well. She reports sleeping well. She does not have additional problems to discuss today.  HPI  Patient has AWV on 04/26/20 with NHA  Past Medical History:  Diagnosis Date  . Diabetes mellitus without complication (HCC)   . GERD (gastroesophageal reflux disease)   . Hypercholesteremia   . Kidney stones    Takes lisinopril for kidney protection  . Kidney stones   . Parathyroid disorder (HCC)   . Sleep apnea    does not use CPAP  . Vertigo    in past   Past Surgical History:  Procedure Laterality Date  . CATARACT EXTRACTION W/PHACO Left 05/14/2017   Procedure: CATARACT EXTRACTION PHACO AND INTRAOCULAR LENS PLACEMENT (IOC) LEFT DIABETIC;  Surgeon: Lockie Mola, MD;  Location: [facility];  Service: Ophthalmology;  Laterality: Left;  diabetic  . CYSTOSCOPY WITH URETEROSCOPY AND STENT PLACEMENT  09/29/2015   Dr. Lonna Cobb  . DIALYSIS/PERMA CATHETER INSERTION Left 06/21/2018   Procedure: DIALYSIS/PERMA CATHETER INSERTION;  Surgeon: Renford Dills, MD;  Location: [facility];  Service: Cardiovascular;  Laterality: Left;  . DIALYSIS/PERMA CATHETER REMOVAL N/A 07/18/2018   Procedure: DIALYSIS/PERMA CATHETER REMOVAL;  Surgeon: Annice Needy, MD;  Location: [facility] IN[facility]AS[facility] CV [facility];  Service: Cardiovascular;  Laterality: N/A;  . ESOPHAGOGASTRODUODENOSCOPY ENDOSCOPY    . LITHOTRIPSY  2002  . PARATHYROIDECTOMY  1990   Social History   Socioeconomic History  . Marital status:  Widowed    Spouse name: Not on file  . Number of children: 1  . Years of education: Not on file  . Highest education level: Some college, no degree  Occupational History  . Occupation: retired  Tobacco Use  . Smoking status: Never Smoker  . Smokeless tobacco: Never Used  Vaping Use  . Vaping Use: Never used  Substance and Sexual Activity  . Alcohol use: No    Comment: 2-3 times a year  . Drug use: No  . Sexual activity: Not on file  Other Topics Concern  . Not on file  Social History Narrative   Was legally separated at time of husbands death.   Social Determinants of Health   Financial Resource Strain: Low Risk   . Difficulty of Paying Living Expenses: Not hard at all  Food Insecurity: No Food Insecurity  . Worried About Programme researcher, broadcasting/film/video in the Last Year: Never true  . Ran Out of Food in the Last Year: Never true  Transportation Needs: No Transportation Needs  . Lack of Transportation (Medical): No  . Lack of Transportation (Non-Medical): No  Physical Activity: Inactive  . Days of Exercise per Week: 0 days  . Minutes of Exercise per Session: 0 min  Stress: No Stress Concern Present  . Feeling of Stress : Not at all  Social Connections: Moderately Integrated  . Frequency of Communication with Friends and Family: More than three times a week  . Frequency of Social Gatherings with Friends and Family: More than three times a  week  . Attends Religious Services: More than 4 times per year  . Active Member of Clubs or Organizations: Yes  . Attends Banker Meetings: More than 4 times per year  . Marital Status: Widowed  Intimate Partner Violence: Not At Risk  . Fear of Current or Ex-Partner: No  . Emotionally Abused: No  . Physically Abused: No  . Sexually Abused: No   Family Status  Relation Name Status  . Father  Deceased at age 55s  . Mother  Alive  . Sister  Alive  . Brother  Alive  . Son  Alive  . Brother  Alive  . Neg Hx  (Not Specified)    Family History  Problem Relation Age of Onset  . Colon cancer Father   . Heart disease Mother   . Breast cancer Neg Hx    Allergies  Allergen Reactions  . Prilosec [Omeprazole] Other (See Comments)    Reaction: unknown  . Ciprofloxacin Rash    Patient Care Team: Margaretann Loveless, PA-C as PCP - General (Family Medicine) Mosetta Pigeon, MD (Nephrology) Riki Altes, MD (Urology) Carrie Mew, OD (Optometry) Janne Napoleon, DMD as Referring Physician (Dentistry)   Medications: Outpatient Medications Prior to Visit  Medication Sig  . acetaminophen (TYLENOL) 325 MG tablet Take 2 tablets (650 mg total) by mouth every 6 (six) hours as needed for mild pain (or Fever >/= 101).  Marland Kitchen amLODipine (NORVASC) 2.5 MG tablet TAKE 1 TABLET BY MOUTH AT  BEDTIME  . Cholecalciferol (VITAMIN D3) 50 MCG (2000 UT) capsule Take 2,000 Units by mouth daily.  . ferrous sulfate 325 (65 FE) MG tablet Take 325 mg by mouth daily.   . Multiple Vitamin (MULTI-VITAMIN DAILY) TABS daily.   Marland Kitchen OVER THE COUNTER MEDICATION   . pioglitazone (ACTOS) 15 MG tablet TAKE 1 TABLET BY MOUTH  DAILY  . pravastatin (PRAVACHOL) 20 MG tablet TAKE 1 TABLET BY MOUTH AT  BEDTIME  . sodium bicarbonate 650 MG tablet Take 1,300 mg by mouth 2 (two) times daily.    No facility-administered medications prior to visit.    Review of Systems  Constitutional: Negative.   HENT: Negative.   Eyes: Negative.   Respiratory: Negative.   Cardiovascular: Negative.   Gastrointestinal: Negative.   Endocrine: Negative.   Genitourinary: Negative.   Musculoskeletal: Negative.   Skin: Negative.   Allergic/Immunologic: Negative.   Neurological: Negative.   Hematological: Negative.   Psychiatric/Behavioral: Negative.     Last CBC Lab Results  Component Value Date   WBC 8.5 07/07/2019   HGB 10.5 (L) 07/07/2019   HCT 32.9 (L) 07/07/2019   MCV 92 07/07/2019   MCH 29.2 07/07/2019   RDW 13.1 07/07/2019   PLT 154 07/07/2019    Last metabolic panel Lab Results  Component Value Date   GLUCOSE 126 (H) 06/22/2018   NA 138 06/22/2018   K 3.8 06/22/2018   CL 103 06/22/2018   CO2 28 06/22/2018   BUN 42 (H) 06/22/2018   CREATININE 3.04 (H) 06/22/2018   GFRNONAA 15 (L) 06/22/2018   GFRAA 18 (L) 06/22/2018   CALCIUM 7.2 (L) 06/22/2018   PHOS 4.3 06/22/2018   PROT 6.4 07/07/2019   ALBUMIN 4.3 07/07/2019   LABGLOB 2.4 10/17/2017   AGRATIO 1.8 10/17/2017   BILITOT 0.2 07/07/2019   ALKPHOS 109 07/07/2019   AST 21 07/07/2019   ALT 24 07/07/2019   ANIONGAP 7 06/22/2018      Objective  BP (!) 162/87 (BP Location: Left Arm, Patient Position: Sitting, Cuff Size: Large)   Pulse 65   Temp 97.6 F (36.4 C) (Oral)   Resp 16   Ht 5\' 9"  (1.753 m)   Wt 240 lb 1.6 oz (108.9 kg)   BMI 35.46 kg/m  BP Readings from Last 3 Encounters:  07/12/20 (!) 162/87  04/14/20 136/83  01/08/20 136/84   Wt Readings from Last 3 Encounters:  07/12/20 240 lb 1.6 oz (108.9 kg)  04/14/20 238 lb (108 kg)  01/08/20 237 lb (107.5 kg)      Physical Exam Vitals reviewed.  Constitutional:      General: She is not in acute distress.    Appearance: Normal appearance. She is well-developed and well-nourished. She is obese. She is not ill-appearing or diaphoretic.  HENT:     Head: Normocephalic and atraumatic.     Right Ear: Tympanic membrane, ear canal and external ear normal.     Left Ear: Tympanic membrane, ear canal and external ear normal.     Mouth/Throat:     Mouth: Oropharynx is clear and moist.  Eyes:     General: No scleral icterus.       Right eye: No discharge.        Left eye: No discharge.     Extraocular Movements: Extraocular movements intact and EOM normal.     Conjunctiva/sclera: Conjunctivae normal.     Pupils: Pupils are equal, round, and reactive to light.  Neck:     Thyroid: No thyromegaly.     Vascular: No carotid bruit or JVD.     Trachea: No tracheal deviation.  Cardiovascular:     Rate and  Rhythm: Normal rate and regular rhythm.     Pulses: Normal pulses and intact distal pulses.     Heart sounds: Normal heart sounds. No murmur heard. No friction rub. No gallop.   Pulmonary:     Effort: Pulmonary effort is normal. No respiratory distress.     Breath sounds: Normal breath sounds. No wheezing or rales.  Chest:     Chest wall: No tenderness.  Abdominal:     General: Abdomen is flat. Bowel sounds are normal. There is no distension.     Palpations: Abdomen is soft. There is no mass.     Tenderness: There is no abdominal tenderness. There is no guarding or rebound.  Musculoskeletal:        General: No tenderness or edema. Normal range of motion.     Cervical back: Normal range of motion and neck supple. No tenderness.     Right lower leg: No edema.     Left lower leg: No edema.  Lymphadenopathy:     Cervical: No cervical adenopathy.  Skin:    General: Skin is warm and dry.     Capillary Refill: Capillary refill takes less than 2 seconds.     Findings: No rash.  Neurological:     General: No focal deficit present.     Mental Status: She is alert and oriented to person, place, and time. Mental status is at baseline.  Psychiatric:        Mood and Affect: Mood and affect and mood normal.        Behavior: Behavior normal.        Thought Content: Thought content normal.        Judgment: Judgment normal.     Last depression screening scores PHQ 2/9 Scores 04/26/2020 04/22/2019 10/17/2017  PHQ - 2  Score 0 0 0  PHQ- 9 Score - - -   Last fall risk screening Fall Risk  04/26/2020  Falls in the past year? 0  Number falls in past yr: 0  Injury with Fall? 0   Last Audit-C alcohol use screening Alcohol Use Disorder Test (AUDIT) 04/26/2020  1. How often do you have a drink containing alcohol? 1  2. How many drinks containing alcohol do you have on a typical day when you are drinking? 0  3. How often do you have six or more drinks on one occasion? 0  AUDIT-C Score 1   Alcohol Brief Interventions/Follow-up AUDIT Score <7 follow-up not indicated   A score of 3 or more in women, and 4 or more in men indicates increased risk for alcohol abuse, EXCEPT if all of the points are from question 1   No results found for any visits on 07/12/20.  Assessment & Plan    Routine Health Maintenance and Physical Exam  Exercise Activities and Dietary recommendations Goals    . Exercise 3x per week (30 min per time)     Recommend to start walking 3 days a week for at least 30 minutes at a time.        Immunization History  Administered Date(s) Administered  . Fluad Quad(high Dose 65+) 04/01/2019, 04/22/2020  . Influenza, High Dose Seasonal PF 04/17/2018  . Influenza-Unspecified 04/08/2015  . PFIZER SARS-COV-2 Vaccination 08/06/2019, 08/27/2019  . Pneumococcal Conjugate-13 10/12/2016  . Pneumococcal Polysaccharide-23 10/17/2017  . Tdap 09/04/2011    Health Maintenance  Topic Date Due  . COVID-19 Vaccine (3 - Booster for Pfizer series) 02/24/2020  . HEMOGLOBIN A1C  07/10/2020  . OPHTHALMOLOGY EXAM  10/19/2020  . MAMMOGRAM  11/21/2020  . COLONOSCOPY (Pts 45-29yrs Insurance coverage will need to be confirmed)  12/06/2020  . FOOT EXAM  01/07/2021  . URINE MICROALBUMIN  01/07/2021  . TETANUS/TDAP  09/03/2021  . INFLUENZA VACCINE  Completed  . DEXA SCAN  Completed  . Hepatitis C Screening  Completed  . PNA vac Low Risk Adult  Completed    Discussed health benefits of physical activity, and encouraged her to engage in regular exercise appropriate for her age and condition.  1. Annual physical exam Normal physical exam today. Will check labs as below and f/u pending lab results. If labs are stable and WNL she will not need to have these rechecked for one year at her next annual physical exam. She is to call the office in the meantime if she has any acute issue, questions or concerns. - TSH  2. Type 2 diabetes mellitus without complication, without long-term  current use of insulin (HCC) Stable. Continue Pioglitazone 15mg . Will check labs as below and f/u pending results. - Hemoglobin A1c  3. Hypercholesteremia Stable. Continue pravastatin 20mg . Will check labs as below and f/u pending results. - Lipid panel  4. Fatty infiltration of liver Has been stable. Monitoring labs.   5. Parathyroid disorder (HCC) H/O this. Followed by Nephrology. Will check labs as below and f/u pending results. - TSH  6. Controlled type 2 diabetes mellitus with diabetic nephropathy, without long-term current use of insulin (HCC) Again stable. Controlled with lifestyle and pioglitazone 15mg .   7. CKD (chronic kidney disease) stage 4, GFR 15-29 ml/min (HCC) Stable. Followed by Nephrology.   8. Primary hypertension Was elevated today in the office. She will send home readings via mychart as she states home readings have been much better. Continue Amlodipine 2.5mg .  No follow-ups on file.     Delmer Islam, PA-C, have reviewed all documentation for this visit. The documentation on 07/13/20 for the exam, diagnosis, procedures, and orders are all accurate and complete.   Reine Just  [facility] [phone] (phone) [phone] (fax)  [facility]

## 2020-07-12 ENCOUNTER — Encounter: Payer: Self-pay | Admitting: Physician Assistant

## 2020-07-12 ENCOUNTER — Other Ambulatory Visit: Payer: Self-pay

## 2020-07-12 ENCOUNTER — Ambulatory Visit (INDEPENDENT_AMBULATORY_CARE_PROVIDER_SITE_OTHER): Payer: Medicare Other | Admitting: Physician Assistant

## 2020-07-12 VITALS — BP 162/87 | HR 65 | Temp 97.6°F | Resp 16 | Ht 69.0 in | Wt 240.1 lb

## 2020-07-12 DIAGNOSIS — E7800 Pure hypercholesterolemia, unspecified: Secondary | ICD-10-CM

## 2020-07-12 DIAGNOSIS — I10 Essential (primary) hypertension: Secondary | ICD-10-CM

## 2020-07-12 DIAGNOSIS — K760 Fatty (change of) liver, not elsewhere classified: Secondary | ICD-10-CM

## 2020-07-12 DIAGNOSIS — Z0000 Encounter for general adult medical examination without abnormal findings: Secondary | ICD-10-CM | POA: Diagnosis not present

## 2020-07-12 DIAGNOSIS — N184 Chronic kidney disease, stage 4 (severe): Secondary | ICD-10-CM | POA: Diagnosis not present

## 2020-07-12 DIAGNOSIS — E215 Disorder of parathyroid gland, unspecified: Secondary | ICD-10-CM | POA: Diagnosis not present

## 2020-07-12 DIAGNOSIS — E1121 Type 2 diabetes mellitus with diabetic nephropathy: Secondary | ICD-10-CM | POA: Diagnosis not present

## 2020-07-12 DIAGNOSIS — E119 Type 2 diabetes mellitus without complications: Secondary | ICD-10-CM

## 2020-07-12 NOTE — Patient Instructions (Signed)
Preventive Care 69 Years and Older, Female Preventive care refers to lifestyle choices and visits with your health care provider that can promote health and wellness. This includes:  A yearly physical exam. This is also called an annual wellness visit.  Regular dental and eye exams.  Immunizations.  Screening for certain conditions.  Healthy lifestyle choices, such as: ? Eating a healthy diet. ? Getting regular exercise. ? Not using drugs or products that contain nicotine and tobacco. ? Limiting alcohol use. What can I expect for my preventive care visit? Physical exam Your health care provider will check your:  Height and weight. These may be used to calculate your BMI (body mass index). BMI is a measurement that tells if you are at a healthy weight.  Heart rate and blood pressure.  Body temperature.  Skin for abnormal spots. Counseling Your health care provider may ask you questions about your:  Past medical problems.  Family's medical history.  Alcohol, tobacco, and drug use.  Emotional well-being.  Home life and relationship well-being.  Sexual activity.  Diet, exercise, and sleep habits.  History of falls.  Memory and ability to understand (cognition).  Work and work Statistician.  Pregnancy and menstrual history.  Access to firearms. What immunizations do I need? Vaccines are usually given at various ages, according to a schedule. Your health care provider will recommend vaccines for you based on your age, medical history, and lifestyle or other factors, such as travel or where you work.   What tests do I need? Blood tests  Lipid and cholesterol levels. These may be checked every 5 years, or more often depending on your overall health.  Hepatitis C test.  Hepatitis B test. Screening  Lung cancer screening. You may have this screening every year starting at age 69 if you have a 30-pack-year history of smoking and currently smoke or have quit within  the past 15 years.  Colorectal cancer screening. ? All adults should have this screening starting at age 69 and continuing until age 58. ? Your health care provider may recommend screening at age 2 if you are at increased risk. ? You will have tests every 1-10 years, depending on your results and the type of screening test.  Diabetes screening. ? This is done by checking your blood sugar (glucose) after you have not eaten for a while (fasting). ? You may have this done every 1-3 years.  Mammogram. ? This may be done every 1-2 years. ? Talk with your health care provider about how often you should have regular mammograms.  Abdominal aortic aneurysm (AAA) screening. You may need this if you are a current or former smoker.  BRCA-related cancer screening. This may be done if you have a family history of breast, ovarian, tubal, or peritoneal cancers. Other tests  STD (sexually transmitted disease) testing, if you are at risk.  Bone density scan. This is done to screen for osteoporosis. You may have this done starting at age 69. Talk with your health care provider about your test results, treatment options, and if necessary, the need for more tests. Follow these instructions at home: Eating and drinking  Eat a diet that includes fresh fruits and vegetables, whole grains, lean protein, and low-fat dairy products. Limit your intake of foods with high amounts of sugar, saturated fats, and salt.  Take vitamin and mineral supplements as recommended by your health care provider.  Do not drink alcohol if your health care provider tells you not to drink.  If you drink alcohol: ? Limit how much you have to 0-1 drink a day. ? Be aware of how much alcohol is in your drink. In the [place] one drink equals one 12 oz bottle of beer (355 mL), one 5 oz glass of wine (148 mL), or one 1 oz glass of hard liquor (44 mL).   Lifestyle  Take daily care of your teeth and gums. Brush your teeth every morning  and night with fluoride toothpaste. Floss one time each day.  Stay active. Exercise for at least 30 minutes 5 or more days each week.  Do not use any products that contain nicotine or tobacco, such as cigarettes, e-cigarettes, and chewing tobacco. If you need help quitting, ask your health care provider.  Do not use drugs.  If you are sexually active, practice safe sex. Use a condom or other form of protection in order to prevent STIs (sexually transmitted infections).  Talk with your health care provider about taking a low-dose aspirin or statin.  Find healthy ways to cope with stress, such as: ? Meditation, yoga, or listening to music. ? Journaling. ? Talking to a trusted person. ? Spending time with friends and family. Safety  Always wear your seat belt while driving or riding in a vehicle.  Do not drive: ? If you have been drinking alcohol. Do not ride with someone who has been drinking. ? When you are tired or distracted. ? While texting.  Wear a helmet and other protective equipment during sports activities.  If you have firearms in your house, make sure you follow all gun safety procedures. What's next?  Visit your health care provider once a year for an annual wellness visit.  Ask your health care provider how often you should have your eyes and teeth checked.  Stay up to date on all vaccines. This information is not intended to replace advice given to you by your health care provider. Make sure you discuss any questions you have with your health care provider. Document Revised: 06/09/2020 Document Reviewed: 06/13/2018 Elsevier Patient Education  2021 Elsevier Inc.  

## 2020-07-13 ENCOUNTER — Telehealth: Payer: Self-pay

## 2020-07-13 LAB — LIPID PANEL
Chol/HDL Ratio: 4 ratio (ref 0.0–4.4)
Cholesterol, Total: 177 mg/dL (ref 100–199)
HDL: 44 mg/dL (ref >39)
LDL Chol Calc (NIH): 106 mg/dL — ABNORMAL HIGH (ref 0–99)
Triglycerides: 156 mg/dL — ABNORMAL HIGH (ref 0–149)
VLDL Cholesterol Cal: 27 mg/dL (ref 5–40)

## 2020-07-13 LAB — TSH: TSH: 2.63 u[IU]/mL (ref 0.450–4.500)

## 2020-07-13 LAB — HEMOGLOBIN A1C
Est. average glucose Bld gHb Est-mCnc: 117 mg/dL
Hgb A1c MFr Bld: 5.7 % — ABNORMAL HIGH (ref 4.8–5.6)

## 2020-07-13 NOTE — Telephone Encounter (Signed)
-----   Message from Mar Daring, [place] sent at 07/13/2020  9:15 AM EST ----- Debra Paul are actually improving as expected with your lifestyle changes. A1c is down to 5.7 from 5.9. Thyroid is normal. Cholesterol has continued to improve. Keep up the good work.  Grace Bushy, [facility]

## 2020-07-20 DIAGNOSIS — R809 Proteinuria, unspecified: Secondary | ICD-10-CM | POA: Diagnosis not present

## 2020-07-20 DIAGNOSIS — E872 Acidosis: Secondary | ICD-10-CM | POA: Diagnosis not present

## 2020-07-20 DIAGNOSIS — I10 Essential (primary) hypertension: Secondary | ICD-10-CM | POA: Diagnosis not present

## 2020-07-20 DIAGNOSIS — N2581 Secondary hyperparathyroidism of renal origin: Secondary | ICD-10-CM | POA: Diagnosis not present

## 2020-07-20 DIAGNOSIS — N184 Chronic kidney disease, stage 4 (severe): Secondary | ICD-10-CM | POA: Diagnosis not present

## 2020-07-20 DIAGNOSIS — R600 Localized edema: Secondary | ICD-10-CM | POA: Diagnosis not present

## 2020-07-20 DIAGNOSIS — E1122 Type 2 diabetes mellitus with diabetic chronic kidney disease: Secondary | ICD-10-CM | POA: Diagnosis not present

## 2020-08-03 ENCOUNTER — Other Ambulatory Visit: Payer: Self-pay | Admitting: Physician Assistant

## 2020-08-18 ENCOUNTER — Other Ambulatory Visit: Payer: Self-pay | Admitting: Physician Assistant

## 2020-09-21 DIAGNOSIS — N184 Chronic kidney disease, stage 4 (severe): Secondary | ICD-10-CM | POA: Diagnosis not present

## 2020-09-21 DIAGNOSIS — R8290 Unspecified abnormal findings in urine: Secondary | ICD-10-CM | POA: Diagnosis not present

## 2020-09-28 DIAGNOSIS — N2581 Secondary hyperparathyroidism of renal origin: Secondary | ICD-10-CM | POA: Diagnosis not present

## 2020-09-28 DIAGNOSIS — N184 Chronic kidney disease, stage 4 (severe): Secondary | ICD-10-CM | POA: Diagnosis not present

## 2020-09-28 DIAGNOSIS — N200 Calculus of kidney: Secondary | ICD-10-CM | POA: Diagnosis not present

## 2020-09-28 DIAGNOSIS — E1122 Type 2 diabetes mellitus with diabetic chronic kidney disease: Secondary | ICD-10-CM | POA: Diagnosis not present

## 2020-09-28 DIAGNOSIS — I10 Essential (primary) hypertension: Secondary | ICD-10-CM | POA: Diagnosis not present

## 2020-09-29 ENCOUNTER — Other Ambulatory Visit: Payer: Self-pay | Admitting: Physician Assistant

## 2020-09-29 DIAGNOSIS — E7800 Pure hypercholesterolemia, unspecified: Secondary | ICD-10-CM

## 2020-09-29 NOTE — Telephone Encounter (Signed)
Requested Prescriptions  Pending Prescriptions Disp Refills  . pravastatin (PRAVACHOL) 20 MG tablet [Pharmacy Med Name: Pravastatin Sodium 20 MG Oral Tablet] 90 tablet 3    Sig: TAKE 1 TABLET BY MOUTH AT  BEDTIME     Cardiovascular:  Antilipid - Statins Failed - 09/29/2020 11:16 PM      Failed - LDL in normal range and within 360 days    LDL Chol Calc (NIH)  Date Value Ref Range Status  07/12/2020 106 (H) 0 - 99 mg/dL Final         Failed - Triglycerides in normal range and within 360 days    Triglycerides  Date Value Ref Range Status  07/12/2020 156 (H) 0 - 149 mg/dL Final         Passed - Total Cholesterol in normal range and within 360 days    Cholesterol, Total  Date Value Ref Range Status  07/12/2020 177 100 - 199 mg/dL Final         Passed - HDL in normal range and within 360 days    HDL  Date Value Ref Range Status  07/12/2020 44 >39 mg/dL Final         Passed - Patient is not pregnant      Passed - Valid encounter within last 12 months    Recent Outpatient Visits          2 months ago Annual physical exam   [facility] Fenton Malling M, PA-C   8 months ago Type 2 diabetes mellitus without complication, without long-term current use of insulin [facility])   [facility], Clearnce Sorrel, [place]   1 year ago Annual physical exam   [facility] Fenton Malling M, [place]   1 year ago Controlled type 2 diabetes mellitus with stage 4 chronic kidney disease, without long-term current use of insulin [facility])   [facility] [place], Clearnce Sorrel, [place]   2 years ago Essential (primary) hypertension   [employer], Clearnce Sorrel, PA-C      Future Appointments            In 3 months [place], Clearnce Sorrel, PA-C [employer], [place]

## 2020-11-09 DIAGNOSIS — Z79899 Other long term (current) drug therapy: Secondary | ICD-10-CM | POA: Diagnosis not present

## 2020-11-09 DIAGNOSIS — E1121 Type 2 diabetes mellitus with diabetic nephropathy: Secondary | ICD-10-CM | POA: Diagnosis not present

## 2020-11-09 DIAGNOSIS — N186 End stage renal disease: Secondary | ICD-10-CM | POA: Diagnosis not present

## 2020-11-09 DIAGNOSIS — Z713 Dietary counseling and surveillance: Secondary | ICD-10-CM | POA: Diagnosis not present

## 2020-11-09 DIAGNOSIS — R9431 Abnormal electrocardiogram [ECG] [EKG]: Secondary | ICD-10-CM | POA: Diagnosis not present

## 2020-11-09 DIAGNOSIS — N200 Calculus of kidney: Secondary | ICD-10-CM | POA: Diagnosis not present

## 2020-11-09 DIAGNOSIS — Z01818 Encounter for other preprocedural examination: Secondary | ICD-10-CM | POA: Diagnosis not present

## 2020-11-09 DIAGNOSIS — I10 Essential (primary) hypertension: Secondary | ICD-10-CM | POA: Diagnosis not present

## 2020-11-09 DIAGNOSIS — Z1159 Encounter for screening for other viral diseases: Secondary | ICD-10-CM | POA: Diagnosis not present

## 2020-11-09 DIAGNOSIS — Z87891 Personal history of nicotine dependence: Secondary | ICD-10-CM | POA: Diagnosis not present

## 2020-11-09 DIAGNOSIS — Z7682 Awaiting organ transplant status: Secondary | ICD-10-CM | POA: Diagnosis not present

## 2020-11-09 DIAGNOSIS — Z87440 Personal history of urinary (tract) infections: Secondary | ICD-10-CM | POA: Diagnosis not present

## 2020-11-09 DIAGNOSIS — Z87442 Personal history of urinary calculi: Secondary | ICD-10-CM | POA: Diagnosis not present

## 2020-11-09 DIAGNOSIS — N185 Chronic kidney disease, stage 5: Secondary | ICD-10-CM | POA: Diagnosis not present

## 2020-11-09 DIAGNOSIS — I119 Hypertensive heart disease without heart failure: Secondary | ICD-10-CM | POA: Diagnosis not present

## 2020-11-10 ENCOUNTER — Telehealth: Payer: Self-pay

## 2020-11-10 DIAGNOSIS — Z1211 Encounter for screening for malignant neoplasm of colon: Secondary | ICD-10-CM

## 2020-11-10 NOTE — Telephone Encounter (Signed)
Copied from [place] [phone]. Topic: General - Other >> Nov 10, 2020  1:16 PM Oneta Rack wrote: Reason for CRM:  patient requesting colonoscopy referral and would like a follow up call when completed.

## 2020-11-25 ENCOUNTER — Other Ambulatory Visit (HOSPITAL_COMMUNITY)
Admission: RE | Admit: 2020-11-25 | Discharge: 2020-11-25 | Disposition: A | Discharge status: Home / Self Care | Payer: Medicare Other | Source: Ambulatory Visit | Attending: Family Medicine | Admitting: Family Medicine

## 2020-11-25 ENCOUNTER — Other Ambulatory Visit: Payer: Self-pay

## 2020-11-25 ENCOUNTER — Ambulatory Visit (INDEPENDENT_AMBULATORY_CARE_PROVIDER_SITE_OTHER): Payer: Medicare Other | Admitting: Family Medicine

## 2020-11-25 ENCOUNTER — Encounter: Payer: Self-pay | Admitting: Family Medicine

## 2020-11-25 VITALS — BP 156/65 | HR 60 | Temp 98.2°F | Wt 242.0 lb

## 2020-11-25 DIAGNOSIS — Z124 Encounter for screening for malignant neoplasm of cervix: Secondary | ICD-10-CM | POA: Diagnosis present

## 2020-11-25 DIAGNOSIS — N184 Chronic kidney disease, stage 4 (severe): Secondary | ICD-10-CM

## 2020-11-25 DIAGNOSIS — Z1151 Encounter for screening for human papillomavirus (HPV): Secondary | ICD-10-CM | POA: Diagnosis not present

## 2020-11-25 DIAGNOSIS — Z01419 Encounter for gynecological examination (general) (routine) without abnormal findings: Secondary | ICD-10-CM | POA: Diagnosis not present

## 2020-11-25 NOTE — Progress Notes (Signed)
Established patient visit   Patient: Debra Paul   DOB: [birth year]   70 y.o. Female  MRN: [id number] Visit Date: 11/25/2020  Today's healthcare provider: Vernie Murders, PA-C   Chief Complaint  Patient presents with   Gynecologic Exam   Subjective    HPI  Pt is here for a Pap smear today in preparation for kidney transplant surgery at [facility].  Patient Active Problem List   Diagnosis Date Noted   Mucocele of lower lip 01/08/2020   Primary nonessential cutis verticis gyrata 07/08/2019   Parathyroid disorder ([place])    Impingement syndrome of shoulder region 04/16/2019   Shoulder pain 04/16/2019   Stiffness of shoulder joint 04/16/2019   History of sepsis 06/18/2018   Guaiac positive stools 11/20/2016   Hypocitraturia 08/21/2016   Anemia 10/13/2015   Hematuria 10/11/2015   Calculus of kidney 02/10/2015   Adult BMI 30+ 11/23/2014   CKD (chronic kidney disease) stage 4, GFR 15-29 ml/min ([place]) 11/23/2014   Colon polyp 11/23/2014   Controlled type 2 diabetes mellitus with diabetic nephropathy ([place]) 11/23/2014   Fatty infiltration of liver 11/23/2014   Herpes zona 11/23/2014   Hypercholesteremia 11/23/2014   Disordered sleep 11/23/2014   Essential (primary) hypertension 11/23/2014   Past Medical History:  Diagnosis Date   Diabetes mellitus without complication (HCC)    GERD (gastroesophageal reflux disease)    Hypercholesteremia    Kidney stones    Takes lisinopril for kidney protection   Kidney stones    Parathyroid disorder (HCC)    Sleep apnea    does not use CPAP   Vertigo    in past   Social History   Tobacco Use   Smoking status: Never Smoker   Smokeless tobacco: Never Used  Vaping Use   Vaping Use: Never used  Substance Use Topics   Alcohol use: No    Comment: 2-3 times a year   Drug use: No   Allergies  Allergen Reactions   Prilosec [Omeprazole] Other (See Comments)    Reaction: unknown   Ciprofloxacin Rash      Medications: Outpatient Medications Prior to Visit  Medication Sig   acetaminophen (TYLENOL) 325 MG tablet Take 2 tablets (650 mg total) by mouth every 6 (six) hours as needed for mild pain (or Fever >/= 101).   amLODipine (NORVASC) 2.5 MG tablet TAKE 1 TABLET BY MOUTH AT  BEDTIME   Cholecalciferol (VITAMIN D3) 50 MCG (2000 UT) capsule Take 2,000 Units by mouth daily.   ferrous sulfate 325 (65 FE) MG tablet Take 325 mg by mouth daily.    Multiple Vitamin (MULTI-VITAMIN DAILY) TABS daily.    OVER THE COUNTER MEDICATION    pioglitazone (ACTOS) 15 MG tablet TAKE 1 TABLET BY MOUTH  DAILY   pravastatin (PRAVACHOL) 20 MG tablet TAKE 1 TABLET BY MOUTH AT  BEDTIME   sodium bicarbonate 650 MG tablet Take 1,300 mg by mouth 2 (two) times daily.    No facility-administered medications prior to visit.    Review of Systems  Constitutional:  Positive for fatigue.  HENT: Negative.    Respiratory: Negative.    Cardiovascular: Negative.   Gastrointestinal: Negative.   Genitourinary: Negative.   Musculoskeletal: Negative.       Objective    BP (!) 156/65 (BP Location: Right Arm, Patient Position: Sitting, Cuff Size: Large)   Pulse 60   Temp 98.2 F (36.8 C) (Oral)   Wt 242 lb (109.8 kg)   SpO2 100%  BMI 35.74 kg/m     Physical Exam Constitutional:      Appearance: She is well-developed.  HENT:     Head: Normocephalic and atraumatic.     Right Ear: External ear normal.     Left Ear: External ear normal.     Nose: Nose normal.  Eyes:     General:        Right eye: No discharge.     Conjunctiva/sclera: Conjunctivae normal.     Pupils: Pupils are equal, round, and reactive to light.  Neck:     Thyroid: No thyromegaly.     Trachea: No tracheal deviation.  Cardiovascular:     Rate and Rhythm: Normal rate and regular rhythm.     Heart sounds: Normal heart sounds. No murmur heard. Pulmonary:     Effort: Pulmonary effort is normal. No respiratory distress.     Breath sounds: Normal  breath sounds. No wheezing or rales.  Chest:     Chest wall: No tenderness.  Abdominal:     General: There is no distension.     Palpations: Abdomen is soft. There is no mass.     Tenderness: There is no abdominal tenderness. There is no guarding or rebound.  Musculoskeletal:        General: No tenderness. Normal range of motion.     Cervical back: Normal range of motion and neck supple.  Lymphadenopathy:     Cervical: No cervical adenopathy.  Skin:    General: Skin is warm and dry.     Findings: No erythema or rash.  Neurological:     Mental Status: She is alert and oriented to person, place, and time.     Cranial Nerves: No cranial nerve deficit.     Motor: No abnormal muscle tone.     Coordination: Coordination normal.     Deep Tendon Reflexes: Reflexes are normal and symmetric. Reflexes normal.  Psychiatric:        Behavior: Behavior normal.        Thought Content: Thought content normal.        Judgment: Judgment normal.      No results found for any visits on 11/25/20.  Assessment & Plan     1. Well woman exam General health stable. Pap smear obtained in preparation for kidney transplant..  2. CKD (chronic kidney disease) stage 4, GFR 15-29 ml/min (HCC) Preparing for transplant at [facility]. GFR's in the low 20's with history of diabetic nephropathy.  3. Cervical cancer screening - Cytology - PAP   No follow-ups on file.      I, Jaydn Moscato, PA-C, have reviewed all documentation for this visit. The documentation on 11/25/20 for the exam, diagnosis, procedures, and orders are all accurate and complete.    Vernie Murders, PA-C  [employer] [phone] (phone) [phone] (fax)  [place]

## 2020-11-30 LAB — CYTOLOGY - PAP
Comment: NEGATIVE
Diagnosis: NEGATIVE
High risk HPV: NEGATIVE

## 2020-12-01 ENCOUNTER — Telehealth: Payer: Self-pay

## 2020-12-01 DIAGNOSIS — Z86010 Personal history of colonic polyps: Secondary | ICD-10-CM | POA: Diagnosis not present

## 2020-12-01 NOTE — Telephone Encounter (Signed)
Copied from [place] [id number]. Topic: General - Other >> Dec 01, 2020  2:19 PM Antonieta Iba C wrote: Reason for CRM: Juliann Pulse with [facility] is calling in to confirm receipt of pt's medical records request?   Please assist/advise further.   [phone]

## 2020-12-07 ENCOUNTER — Telehealth: Payer: Self-pay

## 2020-12-07 NOTE — Telephone Encounter (Signed)
Please fax copy of Pap smear results and her wellness visit from 11/25/20 to [facility], Attention [place], at [phone] Thanks   Copied from [place]. Topic: General - Other >> Dec 07, 2020  2:14 PM Erick Blinks wrote: Needs a PAP smear and Women's wellness visit, STAT request for continuity of care  [facility] from [place] [phone]

## 2020-12-09 ENCOUNTER — Encounter: Payer: Self-pay | Admitting: *Deleted

## 2020-12-09 NOTE — Telephone Encounter (Signed)
Completed 12/07/20.

## 2020-12-10 ENCOUNTER — Ambulatory Visit
Admission: RE | Admit: 2020-12-10 | Discharge: 2020-12-10 | Disposition: A | Discharge status: Home / Self Care | Payer: Medicare Other | Attending: Gastroenterology | Admitting: Gastroenterology

## 2020-12-10 ENCOUNTER — Ambulatory Visit: Payer: Medicare Other | Admitting: Anesthesiology

## 2020-12-10 ENCOUNTER — Encounter: Payer: Self-pay | Admitting: *Deleted

## 2020-12-10 ENCOUNTER — Encounter
Admission: RE | Disposition: A | Discharge status: Home / Self Care | Payer: Self-pay | Source: Home / Self Care | Attending: Gastroenterology

## 2020-12-10 DIAGNOSIS — Z1211 Encounter for screening for malignant neoplasm of colon: Secondary | ICD-10-CM | POA: Diagnosis not present

## 2020-12-10 DIAGNOSIS — Z86010 Personal history of colonic polyps: Secondary | ICD-10-CM | POA: Diagnosis not present

## 2020-12-10 DIAGNOSIS — Z7984 Long term (current) use of oral hypoglycemic drugs: Secondary | ICD-10-CM | POA: Diagnosis not present

## 2020-12-10 DIAGNOSIS — K640 First degree hemorrhoids: Secondary | ICD-10-CM | POA: Diagnosis not present

## 2020-12-10 DIAGNOSIS — Z800 Family history of malignant neoplasm of digestive organs: Secondary | ICD-10-CM | POA: Insufficient documentation

## 2020-12-10 DIAGNOSIS — E1122 Type 2 diabetes mellitus with diabetic chronic kidney disease: Secondary | ICD-10-CM | POA: Diagnosis not present

## 2020-12-10 DIAGNOSIS — N189 Chronic kidney disease, unspecified: Secondary | ICD-10-CM | POA: Diagnosis not present

## 2020-12-10 DIAGNOSIS — Z79899 Other long term (current) drug therapy: Secondary | ICD-10-CM | POA: Diagnosis not present

## 2020-12-10 DIAGNOSIS — K648 Other hemorrhoids: Secondary | ICD-10-CM | POA: Diagnosis not present

## 2020-12-10 DIAGNOSIS — K219 Gastro-esophageal reflux disease without esophagitis: Secondary | ICD-10-CM | POA: Insufficient documentation

## 2020-12-10 DIAGNOSIS — Z881 Allergy status to other antibiotic agents status: Secondary | ICD-10-CM | POA: Insufficient documentation

## 2020-12-10 DIAGNOSIS — I129 Hypertensive chronic kidney disease with stage 1 through stage 4 chronic kidney disease, or unspecified chronic kidney disease: Secondary | ICD-10-CM | POA: Diagnosis not present

## 2020-12-10 DIAGNOSIS — Z888 Allergy status to other drugs, medicaments and biological substances status: Secondary | ICD-10-CM | POA: Insufficient documentation

## 2020-12-10 HISTORY — DX: Other fecal abnormalities: R19.5

## 2020-12-10 HISTORY — DX: Essential (primary) hypertension: I10

## 2020-12-10 HISTORY — PX: COLONOSCOPY WITH PROPOFOL: SHX5780

## 2020-12-10 HISTORY — DX: Personal history of urinary calculi: Z87.442

## 2020-12-10 HISTORY — DX: Anemia, unspecified: D64.9

## 2020-12-10 LAB — GLUCOSE, CAPILLARY: Glucose-Capillary: 109 mg/dL — ABNORMAL HIGH (ref 70–99)

## 2020-12-10 SURGERY — COLONOSCOPY WITH PROPOFOL
Anesthesia: General

## 2020-12-10 MED ORDER — MIDAZOLAM HCL 2 MG/2ML IJ SOLN
INTRAMUSCULAR | Status: AC
  Filled 2020-12-10: qty 2

## 2020-12-10 MED ORDER — LIDOCAINE HCL (CARDIAC) PF 100 MG/5ML IV SOSY
PREFILLED_SYRINGE | INTRAVENOUS | Status: DC | PRN
  Administered 2020-12-10: 50 mg via INTRAVENOUS

## 2020-12-10 MED ORDER — PROPOFOL 500 MG/50ML IV EMUL
INTRAVENOUS | Status: AC
  Filled 2020-12-10: qty 50

## 2020-12-10 MED ORDER — LIDOCAINE HCL (PF) 2 % IJ SOLN
INTRAMUSCULAR | Status: AC
  Filled 2020-12-10: qty 5

## 2020-12-10 MED ORDER — PROPOFOL 10 MG/ML IV BOLUS
INTRAVENOUS | Status: DC | PRN
  Administered 2020-12-10: 20 mg via INTRAVENOUS
  Administered 2020-12-10: 10 mg via INTRAVENOUS
  Administered 2020-12-10: 20 mg via INTRAVENOUS

## 2020-12-10 MED ORDER — SODIUM CHLORIDE 0.9 % IV SOLN: INTRAVENOUS | Status: DC

## 2020-12-10 MED ORDER — MIDAZOLAM HCL 2 MG/2ML IJ SOLN
INTRAMUSCULAR | Status: DC | PRN
  Administered 2020-12-10: 2 mg via INTRAVENOUS

## 2020-12-10 MED ORDER — FENTANYL CITRATE (PF) 100 MCG/2ML IJ SOLN
INTRAMUSCULAR | Status: DC | PRN
  Administered 2020-12-10 (×2): 50 ug via INTRAVENOUS

## 2020-12-10 MED ORDER — PROPOFOL 500 MG/50ML IV EMUL
INTRAVENOUS | Status: DC | PRN
  Administered 2020-12-10: 50 ug/kg/min via INTRAVENOUS

## 2020-12-10 MED ORDER — FENTANYL CITRATE (PF) 100 MCG/2ML IJ SOLN
INTRAMUSCULAR | Status: AC
  Filled 2020-12-10: qty 2

## 2020-12-10 NOTE — Op Note (Signed)
[facility] Gastroenterology Patient Name: Debra Paul Procedure Date: 12/10/2020 9:30 AM MRN: [id number] Account #: [id number] Date of Birth: 12-20-1950 Admit Type: Outpatient Age: 70 Room: [facility] ENDO ROOM 3 Gender: Female Note Status: Finalized Procedure:             Colonoscopy Indications:           High risk colon cancer surveillance: Personal history                         of colonic polyps, Family history of colon cancer in a                         first-degree relative before age 88 years Providers:             Andrey Farmer MD, MD Referring MD:          Dionne Bucy. Bacigalupo (Referring MD) Medicines:             Monitored Anesthesia Care Complications:         No immediate complications. Procedure:             Pre-Anesthesia Assessment:                        - Prior to the procedure, a History and Physical was                         performed, and patient medications and allergies were                         reviewed. The patient is competent. The risks and                         benefits of the procedure and the sedation options and                         risks were discussed with the patient. All questions                         were answered and informed consent was obtained.                         Patient identification and proposed procedure were                         verified by the physician, the nurse, the anesthetist                         and the technician in the endoscopy suite. Mental                         Status Examination: alert and oriented. Airway                         Examination: normal oropharyngeal airway and neck                         mobility. Respiratory Examination: clear to  auscultation. CV Examination: normal. Prophylactic                         Antibiotics: The patient does not require prophylactic                         antibiotics. Prior Anticoagulants: The patient has                          taken no previous anticoagulant or antiplatelet                         agents. ASA Grade Assessment: III - A patient with                         severe systemic disease. After reviewing the risks and                         benefits, the patient was deemed in satisfactory                         condition to undergo the procedure. The anesthesia                         plan was to use monitored anesthesia care (MAC).                         Immediately prior to administration of medications,                         the patient was re-assessed for adequacy to receive                         sedatives. The heart rate, respiratory rate, oxygen                         saturations, blood pressure, adequacy of pulmonary                         ventilation, and response to care were monitored                         throughout the procedure. The physical status of the                         patient was re-assessed after the procedure.                        After obtaining informed consent, the colonoscope was                         passed under direct vision. Throughout the procedure,                         the patient's blood pressure, pulse, and oxygen                         saturations were monitored continuously. The  Colonoscope was introduced through the anus and                         advanced to the the cecum, identified by appendiceal                         orifice and ileocecal valve. The colonoscopy was                         performed without difficulty. The patient tolerated                         the procedure well. The quality of the bowel                         preparation was good. Findings:      The perianal and digital rectal examinations were normal.      Internal hemorrhoids were found during retroflexion. The hemorrhoids       were Grade I (internal hemorrhoids that do not prolapse).      The exam was otherwise without  abnormality on direct and retroflexion       views. Impression:            - Internal hemorrhoids.                        - The examination was otherwise normal on direct and                         retroflexion views.                        - No specimens collected. Recommendation:        - Discharge patient to home.                        - Resume previous diet.                        - Continue present medications.                        - Repeat colonoscopy in 5 years for surveillance.                        - Return to referring physician as previously                         scheduled. Procedure Code(s):     --- Professional ---                        M2111, Colorectal cancer screening; colonoscopy on                         individual at high risk Diagnosis Code(s):     --- Professional ---                        Z86.010, Personal history of colonic polyps  K64.0, First degree hemorrhoids                        Z80.0, Family history of malignant neoplasm of                         digestive organs CPT copyright 2019 American Medical Association. All rights reserved. The codes documented in this report are preliminary and upon coder review may  be revised to meet current compliance requirements. Andrey Farmer MD, MD 12/10/2020 9:55:25 AM Number of Addenda: 0 Note Initiated On: 12/10/2020 9:30 AM Scope Withdrawal Time: 0 hours 6 minutes 47 seconds  Total Procedure Duration: 0 hours 13 minutes 45 seconds  Estimated Blood Loss:  Estimated blood loss: none.      [facility]

## 2020-12-10 NOTE — Anesthesia Preprocedure Evaluation (Signed)
Anesthesia Evaluation  Patient identified by MRN, date of birth, ID band Patient awake    Reviewed: Allergy & Precautions, NPO status , Patient's Chart, lab work & pertinent test results  History of Anesthesia Complications Negative for: history of anesthetic complications  Airway Mallampati: II       Dental   Pulmonary sleep apnea (Not using CPAP) , neg COPD, Not current smoker,           Cardiovascular hypertension, Pt. on medications (-) Past MI and (-) CHF (-) dysrhythmias (-) Valvular Problems/Murmurs     Neuro/Psych neg Seizures    GI/Hepatic Neg liver ROS, GERD  Medicated and Controlled,  Endo/Other  diabetes, Type 2, Oral Hypoglycemic Agents  Renal/GU Renal Insufficiency, ESRF and CRFRenal disease (not currently on dialysis)     Musculoskeletal   Abdominal   Peds  Hematology   Anesthesia Other Findings   Reproductive/Obstetrics                             Anesthesia Physical Anesthesia Plan  ASA: 3  Anesthesia Plan: General   Post-op Pain Management:    Induction: Intravenous  PONV Risk Score and Plan: 3 and Propofol infusion, TIVA and Treatment may vary due to age or medical condition  Airway Management Planned: Nasal Cannula  Additional Equipment:   Intra-op Plan:   Post-operative Plan:   Informed Consent: I have reviewed the patients History and Physical, chart, labs and discussed the procedure including the risks, benefits and alternatives for the proposed anesthesia with the patient or authorized representative who has indicated his/her understanding and acceptance.       Plan Discussed with:   Anesthesia Plan Comments:         Anesthesia Quick Evaluation

## 2020-12-10 NOTE — Interval H&P Note (Signed)
History and Physical Interval Note:  12/10/2020 9:27 AM  Debra Paul  has presented today for surgery, with the diagnosis of PERSONAL HX.OF COLON POLYPS.  The various methods of treatment have been discussed with the patient and family. After consideration of risks, benefits and other options for treatment, the patient has consented to  Procedure(s) with comments: COLONOSCOPY WITH PROPOFOL (N/A) - DM as a surgical intervention.  The patient's history has been reviewed, patient examined, no change in status, stable for surgery.  I have reviewed the patient's chart and labs.  Questions were answered to the patient's satisfaction.     [name]  Ok to proceed with colonoscopy

## 2020-12-10 NOTE — H&P (Signed)
Outpatient short stay form Pre-procedure 12/10/2020 9:25 AM Raylene Miyamoto MD, MPH  Primary Physician: Dr. Brita Romp  Reason for visit:  Surveillance  History of present illness:   70 y/o lady with history of DM II, hypertension, and CKD here for surveillance colonoscopy. Father with colon cancer in his 70's. No abdominal surgeries. No blood thinners. No new symptoms.    Current Facility-Administered Medications:    0.9 %  sodium chloride infusion, , Intravenous, Continuous, Zylpha Poynor, Hilton Cork, MD, Last Rate: 20 mL/hr at 12/10/20 0901, New Bag at 12/10/20 0901  Medications Prior to Admission  Medication Sig Dispense Refill Last Dose   amLODipine (NORVASC) 2.5 MG tablet TAKE 1 TABLET BY MOUTH AT  BEDTIME 90 tablet 1 12/09/2020   losartan (COZAAR) 25 MG tablet Take 25 mg by mouth daily.   12/09/2020   pioglitazone (ACTOS) 15 MG tablet TAKE 1 TABLET BY MOUTH  DAILY 90 tablet 3 12/09/2020   pravastatin (PRAVACHOL) 20 MG tablet TAKE 1 TABLET BY MOUTH AT  BEDTIME 90 tablet 3 12/09/2020   sodium bicarbonate 650 MG tablet Take 1,300 mg by mouth 2 (two) times daily.    Past Week   acetaminophen (TYLENOL) 325 MG tablet Take 2 tablets (650 mg total) by mouth every 6 (six) hours as needed for mild pain (or Fever >/= 101).      Cholecalciferol (VITAMIN D3) 50 MCG (2000 UT) capsule Take 2,000 Units by mouth daily.      ferrous sulfate 325 (65 FE) MG tablet Take 325 mg by mouth daily.       Multiple Vitamin (MULTI-VITAMIN DAILY) TABS daily.       OVER THE COUNTER MEDICATION         Allergies  Allergen Reactions   Prilosec [Omeprazole] Other (See Comments)    Reaction: unknown   Ciprofloxacin Rash     Past Medical History:  Diagnosis Date   Anemia    Diabetes mellitus without complication (HCC)    GERD (gastroesophageal reflux disease)    Guaiac positive stools    History of renal calculi    Hypercholesteremia    Hypertension    Kidney stones    Takes lisinopril for kidney protection    Kidney stones    Parathyroid disorder ([place])    Sleep apnea    does not use CPAP   Vertigo    in past    Review of systems:  Otherwise negative.    Physical Exam  Gen: Alert, oriented. Appears stated age.  HEENT: PERRLA. Lungs: No respiratory distress CV: RRR Abd: soft, benign, no masses Ext: No edema    Planned procedures: Proceed with colonoscopy. The patient understands the nature of the planned procedure, indications, risks, alternatives and potential complications including but not limited to bleeding, infection, perforation, damage to internal organs and possible oversedation/side effects from anesthesia. The patient agrees and gives consent to proceed.  Please refer to procedure notes for findings, recommendations and patient disposition/instructions.     Raylene Miyamoto MD, MPH Gastroenterology 12/10/2020  9:25 AM

## 2020-12-10 NOTE — Transfer of Care (Signed)
Immediate Anesthesia Transfer of Care Note  Patient: Debra Paul  Procedure(s) Performed: COLONOSCOPY WITH PROPOFOL  Patient Location: PACU  Anesthesia Type:General  Level of Consciousness: sedated  Airway & Oxygen Therapy: Patient Spontanous Breathing  Post-op Assessment: Report given to RN and Post -op Vital signs reviewed and stable  Post vital signs: Reviewed and stable  Last Vitals:  Vitals Value Taken Time  BP 112/85 12/10/20 0957  Temp    Pulse 57 12/10/20 0958  Resp 16 12/10/20 0958  SpO2 98 % 12/10/20 0958  Vitals shown include unvalidated device data.  Last Pain:  Vitals:   12/10/20 0957  TempSrc:   PainSc: 0-No pain         Complications: No notable events documented.

## 2020-12-13 NOTE — Anesthesia Postprocedure Evaluation (Signed)
Anesthesia Post Note  Patient: Debra Paul  Procedure(s) Performed: COLONOSCOPY WITH PROPOFOL  Patient location during evaluation: Endoscopy Anesthesia Type: General Level of consciousness: awake and alert Pain management: pain level controlled Vital Signs Assessment: post-procedure vital signs reviewed and stable Respiratory status: spontaneous breathing and respiratory function stable Cardiovascular status: stable Anesthetic complications: no   No notable events documented.   Last Vitals:  Vitals:   12/10/20 0957 12/10/20 1007  BP: 112/85 (!) 147/77  Pulse: (!) 54 (!) 55  Resp: 17 18  Temp:    SpO2: 97% 96%    Last Pain:  Vitals:   12/10/20 1007  TempSrc:   PainSc: 0-No pain                 Ekam Besson K

## 2020-12-17 DIAGNOSIS — Z7682 Awaiting organ transplant status: Secondary | ICD-10-CM | POA: Diagnosis not present

## 2020-12-17 DIAGNOSIS — I10 Essential (primary) hypertension: Secondary | ICD-10-CM | POA: Diagnosis not present

## 2020-12-17 DIAGNOSIS — Z01810 Encounter for preprocedural cardiovascular examination: Secondary | ICD-10-CM | POA: Diagnosis not present

## 2020-12-17 DIAGNOSIS — I5189 Other ill-defined heart diseases: Secondary | ICD-10-CM | POA: Diagnosis not present

## 2020-12-17 DIAGNOSIS — R5383 Other fatigue: Secondary | ICD-10-CM | POA: Diagnosis not present

## 2020-12-17 DIAGNOSIS — I517 Cardiomegaly: Secondary | ICD-10-CM | POA: Diagnosis not present

## 2020-12-17 DIAGNOSIS — Z01818 Encounter for other preprocedural examination: Secondary | ICD-10-CM | POA: Diagnosis not present

## 2020-12-17 DIAGNOSIS — I491 Atrial premature depolarization: Secondary | ICD-10-CM | POA: Diagnosis not present

## 2020-12-17 DIAGNOSIS — I081 Rheumatic disorders of both mitral and tricuspid valves: Secondary | ICD-10-CM | POA: Diagnosis not present

## 2020-12-31 ENCOUNTER — Telehealth: Payer: Medicare Other

## 2021-01-06 DIAGNOSIS — E089 Diabetes mellitus due to underlying condition without complications: Secondary | ICD-10-CM | POA: Diagnosis not present

## 2021-01-06 DIAGNOSIS — H524 Presbyopia: Secondary | ICD-10-CM | POA: Diagnosis not present

## 2021-01-06 DIAGNOSIS — E119 Type 2 diabetes mellitus without complications: Secondary | ICD-10-CM | POA: Diagnosis not present

## 2021-01-10 ENCOUNTER — Ambulatory Visit: Payer: Medicare Other | Admitting: Physician Assistant

## 2021-01-10 DIAGNOSIS — E7800 Pure hypercholesterolemia, unspecified: Secondary | ICD-10-CM | POA: Diagnosis not present

## 2021-01-10 DIAGNOSIS — K760 Fatty (change of) liver, not elsewhere classified: Secondary | ICD-10-CM | POA: Diagnosis not present

## 2021-01-10 DIAGNOSIS — R9439 Abnormal result of other cardiovascular function study: Secondary | ICD-10-CM | POA: Diagnosis not present

## 2021-01-10 DIAGNOSIS — Z01818 Encounter for other preprocedural examination: Secondary | ICD-10-CM | POA: Diagnosis not present

## 2021-01-10 DIAGNOSIS — I10 Essential (primary) hypertension: Secondary | ICD-10-CM | POA: Diagnosis not present

## 2021-01-10 DIAGNOSIS — E1121 Type 2 diabetes mellitus with diabetic nephropathy: Secondary | ICD-10-CM | POA: Diagnosis not present

## 2021-01-24 DIAGNOSIS — I10 Essential (primary) hypertension: Secondary | ICD-10-CM | POA: Diagnosis not present

## 2021-01-24 DIAGNOSIS — N184 Chronic kidney disease, stage 4 (severe): Secondary | ICD-10-CM | POA: Diagnosis not present

## 2021-01-31 DIAGNOSIS — I120 Hypertensive chronic kidney disease with stage 5 chronic kidney disease or end stage renal disease: Secondary | ICD-10-CM | POA: Diagnosis not present

## 2021-01-31 DIAGNOSIS — R600 Localized edema: Secondary | ICD-10-CM | POA: Diagnosis not present

## 2021-01-31 DIAGNOSIS — N2581 Secondary hyperparathyroidism of renal origin: Secondary | ICD-10-CM | POA: Diagnosis not present

## 2021-01-31 DIAGNOSIS — R9439 Abnormal result of other cardiovascular function study: Secondary | ICD-10-CM | POA: Diagnosis not present

## 2021-01-31 DIAGNOSIS — I2510 Atherosclerotic heart disease of native coronary artery without angina pectoris: Secondary | ICD-10-CM | POA: Diagnosis not present

## 2021-01-31 DIAGNOSIS — E7800 Pure hypercholesterolemia, unspecified: Secondary | ICD-10-CM | POA: Diagnosis not present

## 2021-01-31 DIAGNOSIS — Z87891 Personal history of nicotine dependence: Secondary | ICD-10-CM | POA: Diagnosis not present

## 2021-01-31 DIAGNOSIS — Z794 Long term (current) use of insulin: Secondary | ICD-10-CM | POA: Diagnosis not present

## 2021-01-31 DIAGNOSIS — N184 Chronic kidney disease, stage 4 (severe): Secondary | ICD-10-CM | POA: Diagnosis not present

## 2021-01-31 DIAGNOSIS — N186 End stage renal disease: Secondary | ICD-10-CM | POA: Diagnosis not present

## 2021-01-31 DIAGNOSIS — E1121 Type 2 diabetes mellitus with diabetic nephropathy: Secondary | ICD-10-CM | POA: Diagnosis not present

## 2021-01-31 DIAGNOSIS — R809 Proteinuria, unspecified: Secondary | ICD-10-CM | POA: Diagnosis not present

## 2021-01-31 DIAGNOSIS — E1122 Type 2 diabetes mellitus with diabetic chronic kidney disease: Secondary | ICD-10-CM | POA: Diagnosis not present

## 2021-01-31 DIAGNOSIS — I10 Essential (primary) hypertension: Secondary | ICD-10-CM | POA: Diagnosis not present

## 2021-01-31 DIAGNOSIS — K760 Fatty (change of) liver, not elsewhere classified: Secondary | ICD-10-CM | POA: Diagnosis not present

## 2021-01-31 DIAGNOSIS — Z20822 Contact with and (suspected) exposure to covid-19: Secondary | ICD-10-CM | POA: Diagnosis not present

## 2021-01-31 DIAGNOSIS — E872 Acidosis: Secondary | ICD-10-CM | POA: Diagnosis not present

## 2021-01-31 DIAGNOSIS — D631 Anemia in chronic kidney disease: Secondary | ICD-10-CM | POA: Diagnosis not present

## 2021-01-31 DIAGNOSIS — G4733 Obstructive sleep apnea (adult) (pediatric): Secondary | ICD-10-CM | POA: Diagnosis not present

## 2021-01-31 DIAGNOSIS — Z79899 Other long term (current) drug therapy: Secondary | ICD-10-CM | POA: Diagnosis not present

## 2021-01-31 DIAGNOSIS — N200 Calculus of kidney: Secondary | ICD-10-CM | POA: Diagnosis not present

## 2021-01-31 DIAGNOSIS — E785 Hyperlipidemia, unspecified: Secondary | ICD-10-CM | POA: Diagnosis not present

## 2021-02-01 DIAGNOSIS — I120 Hypertensive chronic kidney disease with stage 5 chronic kidney disease or end stage renal disease: Secondary | ICD-10-CM | POA: Diagnosis not present

## 2021-02-01 DIAGNOSIS — N185 Chronic kidney disease, stage 5: Secondary | ICD-10-CM | POA: Diagnosis not present

## 2021-02-01 DIAGNOSIS — K760 Fatty (change of) liver, not elsewhere classified: Secondary | ICD-10-CM | POA: Diagnosis not present

## 2021-02-01 DIAGNOSIS — Z87891 Personal history of nicotine dependence: Secondary | ICD-10-CM | POA: Diagnosis not present

## 2021-02-01 DIAGNOSIS — G4733 Obstructive sleep apnea (adult) (pediatric): Secondary | ICD-10-CM | POA: Diagnosis not present

## 2021-02-01 DIAGNOSIS — Z7682 Awaiting organ transplant status: Secondary | ICD-10-CM | POA: Diagnosis not present

## 2021-02-01 DIAGNOSIS — R9439 Abnormal result of other cardiovascular function study: Secondary | ICD-10-CM | POA: Diagnosis not present

## 2021-02-01 DIAGNOSIS — Z794 Long term (current) use of insulin: Secondary | ICD-10-CM | POA: Diagnosis not present

## 2021-02-01 DIAGNOSIS — Z79899 Other long term (current) drug therapy: Secondary | ICD-10-CM | POA: Diagnosis not present

## 2021-02-01 DIAGNOSIS — D631 Anemia in chronic kidney disease: Secondary | ICD-10-CM | POA: Diagnosis not present

## 2021-02-01 DIAGNOSIS — I2510 Atherosclerotic heart disease of native coronary artery without angina pectoris: Secondary | ICD-10-CM | POA: Diagnosis not present

## 2021-02-01 DIAGNOSIS — E1122 Type 2 diabetes mellitus with diabetic chronic kidney disease: Secondary | ICD-10-CM | POA: Diagnosis not present

## 2021-02-01 DIAGNOSIS — E785 Hyperlipidemia, unspecified: Secondary | ICD-10-CM | POA: Diagnosis not present

## 2021-02-01 DIAGNOSIS — E7800 Pure hypercholesterolemia, unspecified: Secondary | ICD-10-CM | POA: Diagnosis not present

## 2021-02-01 DIAGNOSIS — Z7984 Long term (current) use of oral hypoglycemic drugs: Secondary | ICD-10-CM | POA: Diagnosis not present

## 2021-02-01 DIAGNOSIS — E1121 Type 2 diabetes mellitus with diabetic nephropathy: Secondary | ICD-10-CM | POA: Diagnosis not present

## 2021-02-01 DIAGNOSIS — Z01810 Encounter for preprocedural cardiovascular examination: Secondary | ICD-10-CM | POA: Diagnosis not present

## 2021-02-01 DIAGNOSIS — N186 End stage renal disease: Secondary | ICD-10-CM | POA: Diagnosis not present

## 2021-02-01 DIAGNOSIS — Z20822 Contact with and (suspected) exposure to covid-19: Secondary | ICD-10-CM | POA: Diagnosis not present

## 2021-02-02 DIAGNOSIS — E785 Hyperlipidemia, unspecified: Secondary | ICD-10-CM | POA: Diagnosis not present

## 2021-02-02 DIAGNOSIS — E1122 Type 2 diabetes mellitus with diabetic chronic kidney disease: Secondary | ICD-10-CM | POA: Diagnosis not present

## 2021-02-02 DIAGNOSIS — E1121 Type 2 diabetes mellitus with diabetic nephropathy: Secondary | ICD-10-CM | POA: Diagnosis not present

## 2021-02-02 DIAGNOSIS — K760 Fatty (change of) liver, not elsewhere classified: Secondary | ICD-10-CM | POA: Diagnosis not present

## 2021-02-02 DIAGNOSIS — Z87891 Personal history of nicotine dependence: Secondary | ICD-10-CM | POA: Diagnosis not present

## 2021-02-02 DIAGNOSIS — G4733 Obstructive sleep apnea (adult) (pediatric): Secondary | ICD-10-CM | POA: Diagnosis not present

## 2021-02-02 DIAGNOSIS — E7800 Pure hypercholesterolemia, unspecified: Secondary | ICD-10-CM | POA: Diagnosis not present

## 2021-02-02 DIAGNOSIS — D631 Anemia in chronic kidney disease: Secondary | ICD-10-CM | POA: Diagnosis not present

## 2021-02-02 DIAGNOSIS — I120 Hypertensive chronic kidney disease with stage 5 chronic kidney disease or end stage renal disease: Secondary | ICD-10-CM | POA: Diagnosis not present

## 2021-02-02 DIAGNOSIS — N186 End stage renal disease: Secondary | ICD-10-CM | POA: Diagnosis not present

## 2021-02-02 DIAGNOSIS — I2510 Atherosclerotic heart disease of native coronary artery without angina pectoris: Secondary | ICD-10-CM | POA: Diagnosis not present

## 2021-02-02 DIAGNOSIS — Z20822 Contact with and (suspected) exposure to covid-19: Secondary | ICD-10-CM | POA: Diagnosis not present

## 2021-02-02 DIAGNOSIS — Z79899 Other long term (current) drug therapy: Secondary | ICD-10-CM | POA: Diagnosis not present

## 2021-02-02 DIAGNOSIS — R9439 Abnormal result of other cardiovascular function study: Secondary | ICD-10-CM | POA: Diagnosis not present

## 2021-02-02 DIAGNOSIS — Z794 Long term (current) use of insulin: Secondary | ICD-10-CM | POA: Diagnosis not present

## 2021-02-15 DIAGNOSIS — N184 Chronic kidney disease, stage 4 (severe): Secondary | ICD-10-CM | POA: Diagnosis not present

## 2021-02-21 DIAGNOSIS — E7800 Pure hypercholesterolemia, unspecified: Secondary | ICD-10-CM | POA: Diagnosis not present

## 2021-02-21 DIAGNOSIS — I2510 Atherosclerotic heart disease of native coronary artery without angina pectoris: Secondary | ICD-10-CM | POA: Diagnosis not present

## 2021-02-21 DIAGNOSIS — I10 Essential (primary) hypertension: Secondary | ICD-10-CM | POA: Diagnosis not present

## 2021-03-29 DIAGNOSIS — I10 Essential (primary) hypertension: Secondary | ICD-10-CM | POA: Diagnosis not present

## 2021-03-29 DIAGNOSIS — I129 Hypertensive chronic kidney disease with stage 1 through stage 4 chronic kidney disease, or unspecified chronic kidney disease: Secondary | ICD-10-CM | POA: Diagnosis not present

## 2021-03-29 DIAGNOSIS — N200 Calculus of kidney: Secondary | ICD-10-CM | POA: Diagnosis not present

## 2021-03-29 DIAGNOSIS — N184 Chronic kidney disease, stage 4 (severe): Secondary | ICD-10-CM | POA: Diagnosis not present

## 2021-03-29 DIAGNOSIS — R319 Hematuria, unspecified: Secondary | ICD-10-CM | POA: Diagnosis not present

## 2021-04-04 DIAGNOSIS — I10 Essential (primary) hypertension: Secondary | ICD-10-CM | POA: Diagnosis not present

## 2021-04-04 DIAGNOSIS — N184 Chronic kidney disease, stage 4 (severe): Secondary | ICD-10-CM | POA: Diagnosis not present

## 2021-04-04 DIAGNOSIS — E1122 Type 2 diabetes mellitus with diabetic chronic kidney disease: Secondary | ICD-10-CM | POA: Diagnosis not present

## 2021-04-04 DIAGNOSIS — R600 Localized edema: Secondary | ICD-10-CM | POA: Diagnosis not present

## 2021-04-19 ENCOUNTER — Telehealth: Payer: Self-pay | Admitting: Family Medicine

## 2021-04-19 DIAGNOSIS — Z7682 Awaiting organ transplant status: Secondary | ICD-10-CM | POA: Diagnosis not present

## 2021-04-19 NOTE — Telephone Encounter (Signed)
Copied from [place] [id number]. Topic: Medicare AWV >> Apr 19, 2021 11:42 AM Cher Nakai R wrote: Reason for CRM:  LM re: New  AWV appt information - rescheduled due to [facility] scedule change-srs

## 2021-04-22 ENCOUNTER — Encounter: Payer: Self-pay | Admitting: Family Medicine

## 2021-04-22 DIAGNOSIS — R928 Other abnormal and inconclusive findings on diagnostic imaging of breast: Secondary | ICD-10-CM

## 2021-04-22 DIAGNOSIS — Z1231 Encounter for screening mammogram for malignant neoplasm of breast: Secondary | ICD-10-CM

## 2021-04-27 ENCOUNTER — Ambulatory Visit (INDEPENDENT_AMBULATORY_CARE_PROVIDER_SITE_OTHER): Payer: Medicare Other

## 2021-04-27 ENCOUNTER — Other Ambulatory Visit: Payer: Self-pay

## 2021-04-27 DIAGNOSIS — Z23 Encounter for immunization: Secondary | ICD-10-CM | POA: Diagnosis not present

## 2021-05-16 ENCOUNTER — Other Ambulatory Visit: Payer: Self-pay

## 2021-05-16 ENCOUNTER — Ambulatory Visit (INDEPENDENT_AMBULATORY_CARE_PROVIDER_SITE_OTHER): Payer: Medicare Other

## 2021-05-16 VITALS — BP 110/60 | HR 77 | Temp 98.8°F | Resp 18 | Ht 69.0 in | Wt 240.7 lb

## 2021-05-16 DIAGNOSIS — Z0000 Encounter for general adult medical examination without abnormal findings: Secondary | ICD-10-CM

## 2021-05-16 NOTE — Patient Instructions (Signed)
Debra Paul , Thank you for taking time to come for your Medicare Wellness Visit. I appreciate your ongoing commitment to your health goals. Please review the following plan we discussed and let me know if I can assist you in the future.   Screening recommendations/referrals: Colonoscopy: 12/10/20  Mammogram: 05/24/2020 Bone Density: 11/23/2016 Recommended yearly ophthalmology/optometry visit for glaucoma screening and checkup Recommended yearly dental visit for hygiene and checkup  Vaccinations: Influenza vaccine: 04/27/2021 Pneumococcal vaccine: 10/17/2017 Tdap vaccine: 09/04/2011 Shingles vaccine: declined   Covid-19:08/06/2019, 08/27/19, 03/04/2020, 10/04/2020, 04/29/2021  Advanced directives: son has this in place, requested copy   Conditions/risks identified:   Next appointment: Follow up in one year for your annual wellness visit    Preventive Care 22 Years and Older, Female Preventive care refers to lifestyle choices and visits with your health care provider that can promote health and wellness. What does preventive care include? A yearly physical exam. This is also called an annual well check. Dental exams once or twice a year. Routine eye exams. Ask your health care provider how often you should have your eyes checked. Personal lifestyle choices, including: Daily care of your teeth and gums. Regular physical activity. Eating a healthy diet. Avoiding tobacco and drug use. Limiting alcohol use. Practicing safe sex. Taking low-dose aspirin every day. Taking vitamin and mineral supplements as recommended by your health care provider. What happens during an annual well check? The services and screenings done by your health care provider during your annual well check will depend on your age, overall health, lifestyle risk factors, and family history of disease. Counseling  Your health care provider may ask you questions about your: Alcohol use. Tobacco use. Drug  use. Emotional well-being. Home and relationship well-being. Sexual activity. Eating habits. History of falls. Memory and ability to understand (cognition). Work and work Statistician. Reproductive health. Screening  You may have the following tests or measurements: Height, weight, and BMI. Blood pressure. Lipid and cholesterol levels. These may be checked every 5 years, or more frequently if you are over 40 years old. Skin check. Lung cancer screening. You may have this screening every year starting at age 2 if you have a 30-pack-year history of smoking and currently smoke or have quit within the past 15 years. Fecal occult blood test (FOBT) of the stool. You may have this test every year starting at age 45. Flexible sigmoidoscopy or colonoscopy. You may have a sigmoidoscopy every 5 years or a colonoscopy every 10 years starting at age 40. Hepatitis C blood test. Hepatitis B blood test. Sexually transmitted disease (STD) testing. Diabetes screening. This is done by checking your blood sugar (glucose) after you have not eaten for a while (fasting). You may have this done every 1-3 years. Bone density scan. This is done to screen for osteoporosis. You may have this done starting at age 10. Mammogram. This may be done every 1-2 years. Talk to your health care provider about how often you should have regular mammograms. Talk with your health care provider about your test results, treatment options, and if necessary, the need for more tests. Vaccines  Your health care provider may recommend certain vaccines, such as: Influenza vaccine. This is recommended every year. Tetanus, diphtheria, and acellular pertussis (Tdap, Td) vaccine. You may need a Td booster every 10 years. Zoster vaccine. You may need this after age 56. Pneumococcal 13-valent conjugate (PCV13) vaccine. One dose is recommended after age 22. Pneumococcal polysaccharide (PPSV23) vaccine. One dose is recommended after age  65. Talk to your health care provider about which screenings and vaccines you need and how often you need them. This information is not intended to replace advice given to you by your health care provider. Make sure you discuss any questions you have with your health care provider. Document Released: 07/16/2015 Document Revised: 03/08/2016 Document Reviewed: 04/20/2015 Elsevier Interactive Patient Education  2017 [place] Prevention in the Home Falls can cause injuries. They can happen to people of all ages. There are many things you can do to make your home safe and to help prevent falls. What can I do on the outside of my home? Regularly fix the edges of walkways and driveways and fix any cracks. Remove anything that might make you trip as you walk through a door, such as a raised step or threshold. Trim any bushes or trees on the path to your home. Use bright outdoor lighting. Clear any walking paths of anything that might make someone trip, such as rocks or tools. Regularly check to see if handrails are loose or broken. Make sure that both sides of any steps have handrails. Any raised decks and porches should have guardrails on the edges. Have any leaves, snow, or ice cleared regularly. Use sand or salt on walking paths during winter. Clean up any spills in your garage right away. This includes oil or grease spills. What can I do in the bathroom? Use night lights. Install grab bars by the toilet and in the tub and shower. Do not use towel bars as grab bars. Use non-skid mats or decals in the tub or shower. If you need to sit down in the shower, use a plastic, non-slip stool. Keep the floor dry. Clean up any water that spills on the floor as soon as it happens. Remove soap buildup in the tub or shower regularly. Attach bath mats securely with double-sided non-slip rug tape. Do not have throw rugs and other things on the floor that can make you trip. What can I do in the  bedroom? Use night lights. Make sure that you have a light by your bed that is easy to reach. Do not use any sheets or blankets that are too big for your bed. They should not hang down onto the floor. Have a firm chair that has side arms. You can use this for support while you get dressed. Do not have throw rugs and other things on the floor that can make you trip. What can I do in the kitchen? Clean up any spills right away. Avoid walking on wet floors. Keep items that you use a lot in easy-to-reach places. If you need to reach something above you, use a strong step stool that has a grab bar. Keep electrical cords out of the way. Do not use floor polish or wax that makes floors slippery. If you must use wax, use non-skid floor wax. Do not have throw rugs and other things on the floor that can make you trip. What can I do with my stairs? Do not leave any items on the stairs. Make sure that there are handrails on both sides of the stairs and use them. Fix handrails that are broken or loose. Make sure that handrails are as long as the stairways. Check any carpeting to make sure that it is firmly attached to the stairs. Fix any carpet that is loose or worn. Avoid having throw rugs at the top or bottom of the stairs. If you do have throw  rugs, attach them to the floor with carpet tape. Make sure that you have a light switch at the top of the stairs and the bottom of the stairs. If you do not have them, ask someone to add them for you. What else can I do to help prevent falls? Wear shoes that: Do not have high heels. Have rubber bottoms. Are comfortable and fit you well. Are closed at the toe. Do not wear sandals. If you use a stepladder: Make sure that it is fully opened. Do not climb a closed stepladder. Make sure that both sides of the stepladder are locked into place. Ask someone to hold it for you, if possible. Clearly mark and make sure that you can see: Any grab bars or  handrails. First and last steps. Where the edge of each step is. Use tools that help you move around (mobility aids) if they are needed. These include: Canes. Walkers. Scooters. Crutches. Turn on the lights when you go into a dark area. Replace any light bulbs as soon as they burn out. Set up your furniture so you have a clear path. Avoid moving your furniture around. If any of your floors are uneven, fix them. If there are any pets around you, be aware of where they are. Review your medicines with your doctor. Some medicines can make you feel dizzy. This can increase your chance of falling. Ask your doctor what other things that you can do to help prevent falls. This information is not intended to replace advice given to you by your health care provider. Make sure you discuss any questions you have with your health care provider. Document Released: 04/15/2009 Document Revised: 11/25/2015 Document Reviewed: 07/24/2014 Elsevier Interactive Patient Education  2017 [employer].

## 2021-05-16 NOTE — Progress Notes (Signed)
Subjective:   Debra Paul is a 70 y.o. female who presents for Medicare Annual (Subsequent) preventive examination.  Review of Systems           Objective:    Today's Vitals   05/16/21 1327  BP: 110/60  Pulse: 77  Resp: 18  Temp: 98.8 F (37.1 C)  TempSrc: Oral  Weight: 240 lb 11.2 oz (109.2 kg)  Height: 5\' 9"  (1.753 m)   Body mass index is 35.55 kg/m.  Advanced Directives 12/10/2020 04/26/2020 04/22/2019 07/23/2018 06/21/2018 06/17/2018 05/14/2017  Does Patient Have a Medical Advance Directive? No No No No No No No  Would patient like information on creating a medical advance directive? - No - Patient declined No - Patient declined No - Patient declined No - Patient declined - No - Patient declined    Current Medications (verified) Outpatient Encounter Medications as of 05/16/2021  Medication Sig   ezetimibe (ZETIA) 10 MG tablet Take 1 tablet by mouth as directed.   acetaminophen (TYLENOL) 325 MG tablet Take 2 tablets (650 mg total) by mouth every 6 (six) hours as needed for mild pain (or Fever >/= 101).   amLODipine (NORVASC) 2.5 MG tablet TAKE 1 TABLET BY MOUTH AT  BEDTIME   Cholecalciferol (VITAMIN D3) 50 MCG (2000 UT) capsule Take 2,000 Units by mouth daily.   ferrous sulfate 325 (65 FE) MG tablet Take 325 mg by mouth daily.    losartan (COZAAR) 25 MG tablet Take 25 mg by mouth daily.   magnesium oxide (MAG-OX) 400 MG tablet Take 1 tablet by mouth as directed.   Multiple Vitamin (MULTI-VITAMIN DAILY) TABS daily.    Multiple Vitamin (MULTI-VITAMIN) tablet Take 1 tablet by mouth daily.   OVER THE COUNTER MEDICATION    pioglitazone (ACTOS) 15 MG tablet TAKE 1 TABLET BY MOUTH  DAILY   pravastatin (PRAVACHOL) 20 MG tablet TAKE 1 TABLET BY MOUTH AT  BEDTIME   sodium bicarbonate 650 MG tablet Take 1,300 mg by mouth 2 (two) times daily.    No facility-administered encounter medications on file as of 05/16/2021.    Allergies (verified) Prilosec [omeprazole]  and Ciprofloxacin   History: Past Medical History:  Diagnosis Date   Anemia    Diabetes mellitus without complication (HCC)    GERD (gastroesophageal reflux disease)    Guaiac positive stools    History of renal calculi    Hypercholesteremia    Hypertension    Kidney stones    Takes lisinopril for kidney protection   Kidney stones    Parathyroid disorder ([place])    Sleep apnea    does not use CPAP   Vertigo    in past   Past Surgical History:  Procedure Laterality Date   CATARACT EXTRACTION W/PHACO Left 05/14/2017   Procedure: CATARACT EXTRACTION PHACO AND INTRAOCULAR LENS PLACEMENT ([place]) LEFT DIABETIC;  Surgeon: Leandrew Koyanagi, MD;  Location: [facility];  Service: Ophthalmology;  Laterality: Left;  diabetic   COLONOSCOPY     2003,2007,2015,2017   COLONOSCOPY WITH PROPOFOL N/A 12/10/2020   Procedure: COLONOSCOPY WITH PROPOFOL;  Surgeon: [name], MD;  Location: [facility];  Service: Endoscopy;  Laterality: N/A;  DM   CYSTOSCOPY WITH URETEROSCOPY AND STENT PLACEMENT  09/29/2015   Dr. Bernardo Heater   DIALYSIS/PERMA CATHETER INSERTION Left 06/21/2018   Procedure: DIALYSIS/PERMA CATHETER INSERTION;  Surgeon: Katha Cabal, MD;  Location: [facility];  Service: Cardiovascular;  Laterality: Left;   DIALYSIS/PERMA CATHETER REMOVAL N/A 07/18/2018  Procedure: DIALYSIS/PERMA CATHETER REMOVAL;  Surgeon: Algernon Huxley, MD;  Location: [facility];  Service: Cardiovascular;  Laterality: N/A;   ESOPHAGOGASTRODUODENOSCOPY ENDOSCOPY     LITHOTRIPSY  2002   PARATHYROIDECTOMY  1990   Family History  Problem Relation Age of Onset   Heart disease Mother    Atrial fibrillation Mother    Colon cancer Father    Coronary artery disease Maternal Grandmother    Breast cancer Neg Hx    Social History   Socioeconomic History   Marital status: Not on file    Spouse name: Not on file   Number of children: 1   Years of education: Not on file   Highest  education level: Some college, no degree  Occupational History   Occupation: retired  Tobacco Use   Smoking status: Never   Smokeless tobacco: Never  Vaping Use   Vaping Use: Never used  Substance and Sexual Activity   Alcohol use: No    Comment: 2-3 times a year   Drug use: No   Sexual activity: Not Currently  Other Topics Concern   Not on file  Social History Narrative   Was legally separated at time of husbands death.   Social Determinants of Health   Financial Resource Strain: Low Risk    Difficulty of Paying Living Expenses: Not hard at all  Food Insecurity: No Food Insecurity   Worried About Charity fundraiser in the Last Year: Never true   [place] in the Last Year: Never true  Transportation Needs: No Transportation Needs   Lack of Transportation (Medical): No   Lack of Transportation (Non-Medical): No  Physical Activity: Insufficiently Active   Days of Exercise per Week: 3 days   Minutes of Exercise per Session: 20 min  Stress: No Stress Concern Present   Feeling of Stress : Not at all  Social Connections: Moderately Isolated   Frequency of Communication with Friends and Family: More than three times a week   Frequency of Social Gatherings with Friends and Family: Once a week   Attends Religious Services: More than 4 times per year   Active Member of [employer] or Organizations: No   Attends Archivist Meetings: Never   Marital Status: Widowed    Tobacco Counseling Counseling given: Not Answered   Clinical Intake:  Pre-visit preparation completed: Yes  Pain : No/denies pain     Nutritional Status: BMI > 30  Obese Diabetes: Yes CBG done?: No Did pt. bring in CBG monitor from home?: No  How often do you need to have someone help you when you read instructions, pamphlets, or other written materials from your doctor or pharmacy?: 1 - Never  Diabetic?yes Nutrition Risk Assessment:  Has the patient had any N/V/D within the last 2 months?   No  Does the patient have any non-healing wounds?  No  Has the patient had any unintentional weight loss or weight gain?  No   Diabetes:  Is the patient diabetic?  Yes  If diabetic, was a CBG obtained today?  No  Did the patient bring in their glucometer from home?  No  How often do you monitor your CBG's? Twice a day.   Financial Strains and Diabetes Management:  Are you having any financial strains with the device, your supplies or your medication? No .  Does the patient want to be seen by Chronic Care Management for management of their diabetes?  No  Would the patient  like to be referred to a Nutritionist or for Diabetic Management?  No   Diabetic Exams:  Diabetic Eye Exam: Completed 01/06/21. [facility]- Dr.Kevin Ritter  Diabetic Foot Exam: Completed 04/04/21 q72months. Pt has been advised about the importance in completing this exam. Dr. Candiss Norse Interpreter Needed?: No  Information entered by :: Kirke Shaggy, LPN   Activities of Daily Living In your present state of health, do you have any difficulty performing the following activities: 11/25/2020  Hearing? N  Vision? N  Difficulty concentrating or making decisions? N  Walking or climbing stairs? N  Dressing or bathing? N  Doing errands, shopping? N  Some recent data might be hidden    Patient Care Team: Virginia Crews, MD as PCP - General (Family Medicine) Murlean Iba, MD (Nephrology) Abbie Sons, MD (Urology) Bryson Ha, OD (Optometry) Fabio Bering, DMD as Referring Physician (Dentistry)  Indicate any recent Medical Services you may have received from other than [facility] providers in the past year (date may be approximate).     Assessment:   This is a routine wellness examination for [place].  Hearing/Vision screen No results found.  Dietary issues and exercise activities discussed:     Goals Addressed             This Visit's Progress    Exercise 3x per week (30 min per time)        Recommend to start walking 3 days a week for at least 30 minutes at a time. New goals after transplant       Depression Screen PHQ 2/9 Scores 11/25/2020 04/26/2020 04/22/2019 10/17/2017 10/12/2016 10/11/2015  PHQ - 2 Score 0 0 0 0 0 0  PHQ- 9 Score 0 - - - 0 -    Fall Risk Fall Risk  11/25/2020 04/26/2020 04/22/2019 10/17/2017 10/12/2016  Falls in the past year? 0 0 0 No No  Number falls in past yr: 0 0 0 - -  Injury with Fall? 0 0 0 - -  Risk for fall due to : No Fall Risks - - - -  Follow up Falls evaluation completed - - - -    FALL RISK PREVENTION PERTAINING TO THE HOME:  Any stairs in or around the home? Yes  If so, are there any without handrails? No  Home free of loose throw rugs in walkways, pet beds, electrical cords, etc? Yes  Adequate lighting in your home to reduce risk of falls? Yes   ASSISTIVE DEVICES UTILIZED TO PREVENT FALLS:  Life alert? No  Use of a cane, walker or w/c? No  Grab bars in the bathroom? Yes  Shower chair or bench in shower? No  Elevated toilet seat or a handicapped toilet? Yes   TIMED UP AND GO:  Was the test performed? Yes .  Length of time to ambulate 10 feet: 3 sec.   Gait steady and fast without use of assistive device  Cognitive Function: .Normal cognitive status assessed by direct observation by this Nurse Health Advisor. No abnormalities found.      6CIT Screen 04/26/2020  What Year? 0 points  What month? 0 points  What time? 0 points  Count back from 20 0 points  Months in reverse 0 points  Repeat phrase 0 points  Total Score 0    Immunizations Immunization History  Administered Date(s) Administered   Fluad Quad(high Dose 65+) 04/01/2019, 04/22/2020, 04/27/2021   Influenza, High Dose Seasonal PF 04/17/2018   Influenza-Unspecified 04/08/2015  PFIZER Comirnaty(Gray Top)Covid-19 Tri-Sucrose Vaccine 03/04/2020, 10/04/2020   PFIZER(Purple Top)SARS-COV-2 Vaccination 08/06/2019, 08/27/2019   Pneumococcal Conjugate-13  10/12/2016   Pneumococcal Polysaccharide-23 10/17/2017   Tdap 09/04/2011    TDAP status: Up to date  Flu Vaccine status: Up to date  Pneumococcal vaccine status: Up to date  Covid-19 vaccine status: Completed vaccines  Qualifies for Shingles Vaccine? Yes   Zostavax completed No   Shingrix Completed?: No.    Education has been provided regarding the importance of this vaccine. Patient has been advised to call insurance company to determine out of pocket expense if they have not yet received this vaccine. Advised may also receive vaccine at local pharmacy or Health Dept. Verbalized acceptance and understanding.  Screening Tests Health Maintenance  Topic Date Due   Zoster Vaccines- Shingrix (1 of 2) Never done   OPHTHALMOLOGY EXAM  10/19/2020   MAMMOGRAM  11/21/2020   COVID-19 Vaccine (5 - Booster for Pfizer series) 11/29/2020   FOOT EXAM  01/07/2021   HEMOGLOBIN A1C  01/09/2021   TETANUS/TDAP  09/03/2021   COLONOSCOPY (Pts 45-27yrs Insurance coverage will need to be confirmed)  12/10/2025   Pneumonia Vaccine 72+ Years old  Completed   INFLUENZA VACCINE  Completed   DEXA SCAN  Completed   Hepatitis C Screening  Completed   HPV VACCINES  Aged Out    Health Maintenance  Health Maintenance Due  Topic Date Due   Zoster Vaccines- Shingrix (1 of 2) Never done   OPHTHALMOLOGY EXAM  10/19/2020   MAMMOGRAM  11/21/2020   COVID-19 Vaccine (5 - Booster for Pfizer series) 11/29/2020   FOOT EXAM  01/07/2021   HEMOGLOBIN A1C  01/09/2021    Colorectal cancer screening: Type of screening: Colonoscopy. Completed 12/10/2020. Repeat every 10 years  Mammogram status: Completed 05/24/2020. Repeat every year  Bone Density status: Completed 11/23/2016. Results reflect: Bone density results: NORMAL. Repeat every 2 years.  Lung Cancer Screening: (Low Dose CT Chest recommended if Age 86-80 years, 30 pack-year currently smoking OR have quit w/in 15years.) does not qualify.   Additional  Screening:  Hepatitis C Screening: does qualify; Completed 06/18/2018  Vision Screening: Recommended annual ophthalmology exams for early detection of glaucoma and other disorders of the eye. Is the patient up to date with their annual eye exam?  Yes  Who is the provider or what is the name of the office in which the patient attends annual eye exams? Dr.Ritter  Dental Screening: Recommended annual dental exams for proper oral hygiene  Community Resource Referral / Chronic Care Management: CRR required this visit?  No   CCM required this visit?  No      Plan:     I have personally reviewed and noted the following in the patient's chart:   Medical and social history Use of alcohol, tobacco or illicit drugs  Current medications and supplements including opioid prescriptions.  Functional ability and status Nutritional status Physical activity Advanced directives List of other physicians Hospitalizations, surgeries, and ER visits in previous 12 months Vitals Screenings to include cognitive, depression, and falls Referrals and appointments  In addition, I have reviewed and discussed with patient certain preventive protocols, quality metrics, and best practice recommendations. A written personalized care plan for preventive services as well as general preventive health recommendations were provided to patient.     Dionisio David   05/16/2021   Nurse Notes: none

## 2021-05-25 DIAGNOSIS — I10 Essential (primary) hypertension: Secondary | ICD-10-CM | POA: Diagnosis not present

## 2021-05-25 DIAGNOSIS — E1121 Type 2 diabetes mellitus with diabetic nephropathy: Secondary | ICD-10-CM | POA: Diagnosis not present

## 2021-05-25 DIAGNOSIS — I2510 Atherosclerotic heart disease of native coronary artery without angina pectoris: Secondary | ICD-10-CM | POA: Diagnosis not present

## 2021-05-25 DIAGNOSIS — Z20822 Contact with and (suspected) exposure to covid-19: Secondary | ICD-10-CM | POA: Diagnosis not present

## 2021-05-25 DIAGNOSIS — Z1159 Encounter for screening for other viral diseases: Secondary | ICD-10-CM | POA: Diagnosis not present

## 2021-05-25 DIAGNOSIS — N185 Chronic kidney disease, stage 5: Secondary | ICD-10-CM | POA: Diagnosis not present

## 2021-05-25 DIAGNOSIS — Z7682 Awaiting organ transplant status: Secondary | ICD-10-CM | POA: Diagnosis not present

## 2021-05-25 DIAGNOSIS — N200 Calculus of kidney: Secondary | ICD-10-CM | POA: Diagnosis not present

## 2021-05-25 DIAGNOSIS — Z01812 Encounter for preprocedural laboratory examination: Secondary | ICD-10-CM | POA: Diagnosis not present

## 2021-05-30 ENCOUNTER — Ambulatory Visit
Admission: RE | Admit: 2021-05-30 | Discharge: 2021-05-30 | Disposition: A | Discharge status: Home / Self Care | Payer: Medicare Other | Source: Ambulatory Visit | Attending: Family Medicine | Admitting: Family Medicine

## 2021-05-30 ENCOUNTER — Other Ambulatory Visit: Payer: Self-pay

## 2021-05-30 DIAGNOSIS — R928 Other abnormal and inconclusive findings on diagnostic imaging of breast: Secondary | ICD-10-CM | POA: Insufficient documentation

## 2021-05-30 DIAGNOSIS — Z1231 Encounter for screening mammogram for malignant neoplasm of breast: Secondary | ICD-10-CM | POA: Insufficient documentation

## 2021-05-30 DIAGNOSIS — R922 Inconclusive mammogram: Secondary | ICD-10-CM | POA: Diagnosis not present

## 2021-06-01 DIAGNOSIS — K760 Fatty (change of) liver, not elsewhere classified: Secondary | ICD-10-CM | POA: Diagnosis not present

## 2021-06-01 DIAGNOSIS — E7800 Pure hypercholesterolemia, unspecified: Secondary | ICD-10-CM | POA: Diagnosis not present

## 2021-06-01 DIAGNOSIS — E1121 Type 2 diabetes mellitus with diabetic nephropathy: Secondary | ICD-10-CM | POA: Diagnosis not present

## 2021-06-01 DIAGNOSIS — B029 Zoster without complications: Secondary | ICD-10-CM | POA: Diagnosis not present

## 2021-06-01 DIAGNOSIS — N200 Calculus of kidney: Secondary | ICD-10-CM | POA: Diagnosis not present

## 2021-06-01 DIAGNOSIS — N185 Chronic kidney disease, stage 5: Secondary | ICD-10-CM | POA: Diagnosis not present

## 2021-06-01 DIAGNOSIS — D631 Anemia in chronic kidney disease: Secondary | ICD-10-CM | POA: Diagnosis not present

## 2021-06-01 DIAGNOSIS — I2510 Atherosclerotic heart disease of native coronary artery without angina pectoris: Secondary | ICD-10-CM | POA: Diagnosis not present

## 2021-06-01 DIAGNOSIS — Z01818 Encounter for other preprocedural examination: Secondary | ICD-10-CM | POA: Diagnosis not present

## 2021-06-01 DIAGNOSIS — I10 Essential (primary) hypertension: Secondary | ICD-10-CM | POA: Diagnosis not present

## 2021-06-16 DIAGNOSIS — Z792 Long term (current) use of antibiotics: Secondary | ICD-10-CM | POA: Diagnosis not present

## 2021-06-16 DIAGNOSIS — K219 Gastro-esophageal reflux disease without esophagitis: Secondary | ICD-10-CM | POA: Diagnosis not present

## 2021-06-16 DIAGNOSIS — R739 Hyperglycemia, unspecified: Secondary | ICD-10-CM | POA: Diagnosis not present

## 2021-06-16 DIAGNOSIS — Z20822 Contact with and (suspected) exposure to covid-19: Secondary | ICD-10-CM | POA: Diagnosis not present

## 2021-06-16 DIAGNOSIS — Z7982 Long term (current) use of aspirin: Secondary | ICD-10-CM | POA: Diagnosis not present

## 2021-06-16 DIAGNOSIS — Z881 Allergy status to other antibiotic agents status: Secondary | ICD-10-CM | POA: Diagnosis not present

## 2021-06-16 DIAGNOSIS — S37001A Unspecified injury of right kidney, initial encounter: Secondary | ICD-10-CM | POA: Diagnosis not present

## 2021-06-16 DIAGNOSIS — Z87440 Personal history of urinary (tract) infections: Secondary | ICD-10-CM | POA: Diagnosis not present

## 2021-06-16 DIAGNOSIS — E1122 Type 2 diabetes mellitus with diabetic chronic kidney disease: Secondary | ICD-10-CM | POA: Diagnosis not present

## 2021-06-16 DIAGNOSIS — Z794 Long term (current) use of insulin: Secondary | ICD-10-CM | POA: Diagnosis not present

## 2021-06-16 DIAGNOSIS — E1121 Type 2 diabetes mellitus with diabetic nephropathy: Secondary | ICD-10-CM | POA: Diagnosis not present

## 2021-06-16 DIAGNOSIS — Z87442 Personal history of urinary calculi: Secondary | ICD-10-CM | POA: Diagnosis not present

## 2021-06-16 DIAGNOSIS — E7800 Pure hypercholesterolemia, unspecified: Secondary | ICD-10-CM | POA: Diagnosis not present

## 2021-06-16 DIAGNOSIS — I10 Essential (primary) hypertension: Secondary | ICD-10-CM | POA: Diagnosis not present

## 2021-06-16 DIAGNOSIS — Z7984 Long term (current) use of oral hypoglycemic drugs: Secondary | ICD-10-CM | POA: Diagnosis not present

## 2021-06-16 DIAGNOSIS — I120 Hypertensive chronic kidney disease with stage 5 chronic kidney disease or end stage renal disease: Secondary | ICD-10-CM | POA: Diagnosis not present

## 2021-06-16 DIAGNOSIS — T380X5A Adverse effect of glucocorticoids and synthetic analogues, initial encounter: Secondary | ICD-10-CM | POA: Diagnosis not present

## 2021-06-16 DIAGNOSIS — E1065 Type 1 diabetes mellitus with hyperglycemia: Secondary | ICD-10-CM | POA: Diagnosis not present

## 2021-06-16 DIAGNOSIS — Z888 Allergy status to other drugs, medicaments and biological substances status: Secondary | ICD-10-CM | POA: Diagnosis not present

## 2021-06-16 DIAGNOSIS — D8481 Immunodeficiency due to conditions classified elsewhere: Secondary | ICD-10-CM | POA: Diagnosis not present

## 2021-06-16 DIAGNOSIS — E871 Hypo-osmolality and hyponatremia: Secondary | ICD-10-CM | POA: Diagnosis not present

## 2021-06-16 DIAGNOSIS — Z940 Kidney transplant status: Secondary | ICD-10-CM | POA: Diagnosis not present

## 2021-06-16 DIAGNOSIS — Z9889 Other specified postprocedural states: Secondary | ICD-10-CM | POA: Diagnosis not present

## 2021-06-16 DIAGNOSIS — I2510 Atherosclerotic heart disease of native coronary artery without angina pectoris: Secondary | ICD-10-CM | POA: Diagnosis not present

## 2021-06-16 DIAGNOSIS — R7989 Other specified abnormal findings of blood chemistry: Secondary | ICD-10-CM | POA: Diagnosis not present

## 2021-06-16 DIAGNOSIS — Z79899 Other long term (current) drug therapy: Secondary | ICD-10-CM | POA: Diagnosis not present

## 2021-06-16 DIAGNOSIS — N185 Chronic kidney disease, stage 5: Secondary | ICD-10-CM | POA: Diagnosis not present

## 2021-06-16 DIAGNOSIS — D849 Immunodeficiency, unspecified: Secondary | ICD-10-CM | POA: Diagnosis not present

## 2021-06-16 DIAGNOSIS — Z01818 Encounter for other preprocedural examination: Secondary | ICD-10-CM | POA: Diagnosis not present

## 2021-06-16 DIAGNOSIS — Z8719 Personal history of other diseases of the digestive system: Secondary | ICD-10-CM | POA: Diagnosis not present

## 2021-06-16 DIAGNOSIS — N059 Unspecified nephritic syndrome with unspecified morphologic changes: Secondary | ICD-10-CM | POA: Diagnosis not present

## 2021-06-16 DIAGNOSIS — E1022 Type 1 diabetes mellitus with diabetic chronic kidney disease: Secondary | ICD-10-CM | POA: Diagnosis not present

## 2021-06-16 DIAGNOSIS — N186 End stage renal disease: Secondary | ICD-10-CM | POA: Diagnosis not present

## 2021-06-17 DIAGNOSIS — N185 Chronic kidney disease, stage 5: Secondary | ICD-10-CM | POA: Diagnosis not present

## 2021-06-17 DIAGNOSIS — I2510 Atherosclerotic heart disease of native coronary artery without angina pectoris: Secondary | ICD-10-CM | POA: Diagnosis not present

## 2021-06-17 DIAGNOSIS — Z940 Kidney transplant status: Secondary | ICD-10-CM | POA: Diagnosis not present

## 2021-06-17 DIAGNOSIS — Z792 Long term (current) use of antibiotics: Secondary | ICD-10-CM | POA: Diagnosis not present

## 2021-06-17 DIAGNOSIS — D849 Immunodeficiency, unspecified: Secondary | ICD-10-CM | POA: Diagnosis not present

## 2021-06-17 DIAGNOSIS — T380X5A Adverse effect of glucocorticoids and synthetic analogues, initial encounter: Secondary | ICD-10-CM | POA: Diagnosis not present

## 2021-06-17 DIAGNOSIS — R739 Hyperglycemia, unspecified: Secondary | ICD-10-CM | POA: Diagnosis not present

## 2021-06-17 DIAGNOSIS — I10 Essential (primary) hypertension: Secondary | ICD-10-CM | POA: Diagnosis not present

## 2021-06-17 DIAGNOSIS — Z01818 Encounter for other preprocedural examination: Secondary | ICD-10-CM | POA: Diagnosis not present

## 2021-06-18 DIAGNOSIS — Z01818 Encounter for other preprocedural examination: Secondary | ICD-10-CM | POA: Diagnosis not present

## 2021-06-19 DIAGNOSIS — R739 Hyperglycemia, unspecified: Secondary | ICD-10-CM | POA: Diagnosis not present

## 2021-06-19 DIAGNOSIS — Z792 Long term (current) use of antibiotics: Secondary | ICD-10-CM | POA: Diagnosis not present

## 2021-06-19 DIAGNOSIS — Z940 Kidney transplant status: Secondary | ICD-10-CM | POA: Diagnosis not present

## 2021-06-19 DIAGNOSIS — I10 Essential (primary) hypertension: Secondary | ICD-10-CM | POA: Diagnosis not present

## 2021-06-19 DIAGNOSIS — D849 Immunodeficiency, unspecified: Secondary | ICD-10-CM | POA: Diagnosis not present

## 2021-06-19 DIAGNOSIS — N185 Chronic kidney disease, stage 5: Secondary | ICD-10-CM | POA: Diagnosis not present

## 2021-06-19 DIAGNOSIS — Z01818 Encounter for other preprocedural examination: Secondary | ICD-10-CM | POA: Diagnosis not present

## 2021-06-19 DIAGNOSIS — T380X5A Adverse effect of glucocorticoids and synthetic analogues, initial encounter: Secondary | ICD-10-CM | POA: Diagnosis not present

## 2021-06-19 DIAGNOSIS — E1121 Type 2 diabetes mellitus with diabetic nephropathy: Secondary | ICD-10-CM | POA: Diagnosis not present

## 2021-06-20 DIAGNOSIS — D849 Immunodeficiency, unspecified: Secondary | ICD-10-CM | POA: Diagnosis not present

## 2021-06-20 DIAGNOSIS — Z940 Kidney transplant status: Secondary | ICD-10-CM | POA: Diagnosis not present

## 2021-06-20 DIAGNOSIS — R7989 Other specified abnormal findings of blood chemistry: Secondary | ICD-10-CM | POA: Diagnosis not present

## 2021-06-20 DIAGNOSIS — N185 Chronic kidney disease, stage 5: Secondary | ICD-10-CM | POA: Diagnosis not present

## 2021-06-20 DIAGNOSIS — Z792 Long term (current) use of antibiotics: Secondary | ICD-10-CM | POA: Diagnosis not present

## 2021-06-20 DIAGNOSIS — E1121 Type 2 diabetes mellitus with diabetic nephropathy: Secondary | ICD-10-CM | POA: Diagnosis not present

## 2021-06-20 DIAGNOSIS — I10 Essential (primary) hypertension: Secondary | ICD-10-CM | POA: Diagnosis not present

## 2021-06-21 DIAGNOSIS — S37001A Unspecified injury of right kidney, initial encounter: Secondary | ICD-10-CM | POA: Diagnosis not present

## 2021-06-21 DIAGNOSIS — R739 Hyperglycemia, unspecified: Secondary | ICD-10-CM | POA: Diagnosis not present

## 2021-06-21 DIAGNOSIS — T380X5A Adverse effect of glucocorticoids and synthetic analogues, initial encounter: Secondary | ICD-10-CM | POA: Diagnosis not present

## 2021-06-21 DIAGNOSIS — Z792 Long term (current) use of antibiotics: Secondary | ICD-10-CM | POA: Diagnosis not present

## 2021-06-21 DIAGNOSIS — N185 Chronic kidney disease, stage 5: Secondary | ICD-10-CM | POA: Diagnosis not present

## 2021-06-21 DIAGNOSIS — I2510 Atherosclerotic heart disease of native coronary artery without angina pectoris: Secondary | ICD-10-CM | POA: Diagnosis not present

## 2021-06-21 DIAGNOSIS — R7989 Other specified abnormal findings of blood chemistry: Secondary | ICD-10-CM | POA: Diagnosis not present

## 2021-06-21 DIAGNOSIS — E1121 Type 2 diabetes mellitus with diabetic nephropathy: Secondary | ICD-10-CM | POA: Diagnosis not present

## 2021-06-21 DIAGNOSIS — N059 Unspecified nephritic syndrome with unspecified morphologic changes: Secondary | ICD-10-CM | POA: Diagnosis not present

## 2021-06-21 DIAGNOSIS — I10 Essential (primary) hypertension: Secondary | ICD-10-CM | POA: Diagnosis not present

## 2021-06-21 DIAGNOSIS — D849 Immunodeficiency, unspecified: Secondary | ICD-10-CM | POA: Diagnosis not present

## 2021-06-21 DIAGNOSIS — Z940 Kidney transplant status: Secondary | ICD-10-CM | POA: Diagnosis not present

## 2021-06-22 DIAGNOSIS — Z940 Kidney transplant status: Secondary | ICD-10-CM | POA: Diagnosis not present

## 2021-06-22 DIAGNOSIS — I10 Essential (primary) hypertension: Secondary | ICD-10-CM | POA: Diagnosis not present

## 2021-06-22 DIAGNOSIS — I2510 Atherosclerotic heart disease of native coronary artery without angina pectoris: Secondary | ICD-10-CM | POA: Diagnosis not present

## 2021-06-22 DIAGNOSIS — D849 Immunodeficiency, unspecified: Secondary | ICD-10-CM | POA: Diagnosis not present

## 2021-06-22 DIAGNOSIS — Z792 Long term (current) use of antibiotics: Secondary | ICD-10-CM | POA: Diagnosis not present

## 2021-06-22 DIAGNOSIS — E1121 Type 2 diabetes mellitus with diabetic nephropathy: Secondary | ICD-10-CM | POA: Diagnosis not present

## 2021-06-22 DIAGNOSIS — R7989 Other specified abnormal findings of blood chemistry: Secondary | ICD-10-CM | POA: Diagnosis not present

## 2021-06-22 DIAGNOSIS — R739 Hyperglycemia, unspecified: Secondary | ICD-10-CM | POA: Diagnosis not present

## 2021-06-22 DIAGNOSIS — T380X5A Adverse effect of glucocorticoids and synthetic analogues, initial encounter: Secondary | ICD-10-CM | POA: Diagnosis not present

## 2021-06-22 DIAGNOSIS — N185 Chronic kidney disease, stage 5: Secondary | ICD-10-CM | POA: Diagnosis not present

## 2021-06-23 DIAGNOSIS — Z792 Long term (current) use of antibiotics: Secondary | ICD-10-CM | POA: Diagnosis not present

## 2021-06-23 DIAGNOSIS — D849 Immunodeficiency, unspecified: Secondary | ICD-10-CM | POA: Diagnosis not present

## 2021-06-23 DIAGNOSIS — R7989 Other specified abnormal findings of blood chemistry: Secondary | ICD-10-CM | POA: Diagnosis not present

## 2021-06-23 DIAGNOSIS — R739 Hyperglycemia, unspecified: Secondary | ICD-10-CM | POA: Diagnosis not present

## 2021-06-23 DIAGNOSIS — I10 Essential (primary) hypertension: Secondary | ICD-10-CM | POA: Diagnosis not present

## 2021-06-23 DIAGNOSIS — N185 Chronic kidney disease, stage 5: Secondary | ICD-10-CM | POA: Diagnosis not present

## 2021-06-23 DIAGNOSIS — E1121 Type 2 diabetes mellitus with diabetic nephropathy: Secondary | ICD-10-CM | POA: Diagnosis not present

## 2021-06-23 DIAGNOSIS — I2510 Atherosclerotic heart disease of native coronary artery without angina pectoris: Secondary | ICD-10-CM | POA: Diagnosis not present

## 2021-06-23 DIAGNOSIS — Z940 Kidney transplant status: Secondary | ICD-10-CM | POA: Diagnosis not present

## 2021-06-23 DIAGNOSIS — T380X5A Adverse effect of glucocorticoids and synthetic analogues, initial encounter: Secondary | ICD-10-CM | POA: Diagnosis not present

## 2021-06-25 DIAGNOSIS — Z940 Kidney transplant status: Secondary | ICD-10-CM | POA: Diagnosis not present

## 2021-06-25 DIAGNOSIS — D849 Immunodeficiency, unspecified: Secondary | ICD-10-CM | POA: Diagnosis not present

## 2021-06-29 DIAGNOSIS — E871 Hypo-osmolality and hyponatremia: Secondary | ICD-10-CM | POA: Diagnosis not present

## 2021-06-29 DIAGNOSIS — Z792 Long term (current) use of antibiotics: Secondary | ICD-10-CM | POA: Diagnosis not present

## 2021-06-29 DIAGNOSIS — Z940 Kidney transplant status: Secondary | ICD-10-CM | POA: Diagnosis not present

## 2021-06-29 DIAGNOSIS — Z794 Long term (current) use of insulin: Secondary | ICD-10-CM | POA: Diagnosis not present

## 2021-06-29 DIAGNOSIS — Z87891 Personal history of nicotine dependence: Secondary | ICD-10-CM | POA: Diagnosis not present

## 2021-06-29 DIAGNOSIS — D72829 Elevated white blood cell count, unspecified: Secondary | ICD-10-CM | POA: Diagnosis not present

## 2021-06-29 DIAGNOSIS — Z7984 Long term (current) use of oral hypoglycemic drugs: Secondary | ICD-10-CM | POA: Diagnosis not present

## 2021-06-29 DIAGNOSIS — I10 Essential (primary) hypertension: Secondary | ICD-10-CM | POA: Diagnosis not present

## 2021-06-29 DIAGNOSIS — N184 Chronic kidney disease, stage 4 (severe): Secondary | ICD-10-CM | POA: Diagnosis not present

## 2021-06-29 DIAGNOSIS — Z79621 Long term (current) use of calcineurin inhibitor: Secondary | ICD-10-CM | POA: Diagnosis not present

## 2021-06-29 DIAGNOSIS — D649 Anemia, unspecified: Secondary | ICD-10-CM | POA: Diagnosis not present

## 2021-06-29 DIAGNOSIS — Z7982 Long term (current) use of aspirin: Secondary | ICD-10-CM | POA: Diagnosis not present

## 2021-06-29 DIAGNOSIS — D849 Immunodeficiency, unspecified: Secondary | ICD-10-CM | POA: Diagnosis not present

## 2021-06-29 DIAGNOSIS — Z4822 Encounter for aftercare following kidney transplant: Secondary | ICD-10-CM | POA: Diagnosis not present

## 2021-06-29 DIAGNOSIS — E1121 Type 2 diabetes mellitus with diabetic nephropathy: Secondary | ICD-10-CM | POA: Diagnosis not present

## 2021-06-29 DIAGNOSIS — E8809 Other disorders of plasma-protein metabolism, not elsewhere classified: Secondary | ICD-10-CM | POA: Diagnosis not present

## 2021-06-29 DIAGNOSIS — G8918 Other acute postprocedural pain: Secondary | ICD-10-CM | POA: Diagnosis not present

## 2021-06-29 DIAGNOSIS — I129 Hypertensive chronic kidney disease with stage 1 through stage 4 chronic kidney disease, or unspecified chronic kidney disease: Secondary | ICD-10-CM | POA: Diagnosis not present

## 2021-06-29 DIAGNOSIS — E1122 Type 2 diabetes mellitus with diabetic chronic kidney disease: Secondary | ICD-10-CM | POA: Diagnosis not present

## 2021-06-29 DIAGNOSIS — Z79899 Other long term (current) drug therapy: Secondary | ICD-10-CM | POA: Diagnosis not present

## 2021-06-30 DIAGNOSIS — Z940 Kidney transplant status: Secondary | ICD-10-CM | POA: Diagnosis not present

## 2021-06-30 DIAGNOSIS — D849 Immunodeficiency, unspecified: Secondary | ICD-10-CM | POA: Diagnosis not present

## 2021-07-06 ENCOUNTER — Other Ambulatory Visit: Payer: Self-pay | Admitting: Physician Assistant

## 2021-07-06 DIAGNOSIS — L0390 Cellulitis, unspecified: Secondary | ICD-10-CM | POA: Diagnosis not present

## 2021-07-06 DIAGNOSIS — E1165 Type 2 diabetes mellitus with hyperglycemia: Secondary | ICD-10-CM | POA: Diagnosis not present

## 2021-07-06 DIAGNOSIS — B3789 Other sites of candidiasis: Secondary | ICD-10-CM | POA: Diagnosis not present

## 2021-07-06 DIAGNOSIS — I739 Peripheral vascular disease, unspecified: Secondary | ICD-10-CM | POA: Diagnosis not present

## 2021-07-06 DIAGNOSIS — D849 Immunodeficiency, unspecified: Secondary | ICD-10-CM | POA: Diagnosis not present

## 2021-07-06 DIAGNOSIS — R1033 Periumbilical pain: Secondary | ICD-10-CM | POA: Diagnosis not present

## 2021-07-06 DIAGNOSIS — E8729 Other acidosis: Secondary | ICD-10-CM | POA: Diagnosis not present

## 2021-07-06 DIAGNOSIS — E119 Type 2 diabetes mellitus without complications: Secondary | ICD-10-CM | POA: Diagnosis not present

## 2021-07-06 DIAGNOSIS — D899 Disorder involving the immune mechanism, unspecified: Secondary | ICD-10-CM | POA: Diagnosis not present

## 2021-07-06 DIAGNOSIS — R609 Edema, unspecified: Secondary | ICD-10-CM | POA: Diagnosis not present

## 2021-07-06 DIAGNOSIS — T380X5A Adverse effect of glucocorticoids and synthetic analogues, initial encounter: Secondary | ICD-10-CM | POA: Diagnosis not present

## 2021-07-06 DIAGNOSIS — D6959 Other secondary thrombocytopenia: Secondary | ICD-10-CM | POA: Diagnosis not present

## 2021-07-06 DIAGNOSIS — D84821 Immunodeficiency due to drugs: Secondary | ICD-10-CM | POA: Diagnosis not present

## 2021-07-06 DIAGNOSIS — N170 Acute kidney failure with tubular necrosis: Secondary | ICD-10-CM | POA: Diagnosis not present

## 2021-07-06 DIAGNOSIS — D509 Iron deficiency anemia, unspecified: Secondary | ICD-10-CM | POA: Diagnosis not present

## 2021-07-06 DIAGNOSIS — K219 Gastro-esophageal reflux disease without esophagitis: Secondary | ICD-10-CM | POA: Diagnosis not present

## 2021-07-06 DIAGNOSIS — R6521 Severe sepsis with septic shock: Secondary | ICD-10-CM | POA: Diagnosis not present

## 2021-07-06 DIAGNOSIS — T360X5A Adverse effect of penicillins, initial encounter: Secondary | ICD-10-CM | POA: Diagnosis not present

## 2021-07-06 DIAGNOSIS — J9601 Acute respiratory failure with hypoxia: Secondary | ICD-10-CM | POA: Diagnosis not present

## 2021-07-06 DIAGNOSIS — I10 Essential (primary) hypertension: Secondary | ICD-10-CM | POA: Diagnosis not present

## 2021-07-06 DIAGNOSIS — K2970 Gastritis, unspecified, without bleeding: Secondary | ICD-10-CM | POA: Diagnosis not present

## 2021-07-06 DIAGNOSIS — Z299 Encounter for prophylactic measures, unspecified: Secondary | ICD-10-CM | POA: Diagnosis not present

## 2021-07-06 DIAGNOSIS — I898 Other specified noninfective disorders of lymphatic vessels and lymph nodes: Secondary | ICD-10-CM | POA: Diagnosis not present

## 2021-07-06 DIAGNOSIS — D649 Anemia, unspecified: Secondary | ICD-10-CM | POA: Diagnosis not present

## 2021-07-06 DIAGNOSIS — K2980 Duodenitis without bleeding: Secondary | ICD-10-CM | POA: Diagnosis not present

## 2021-07-06 DIAGNOSIS — I82612 Acute embolism and thrombosis of superficial veins of left upper extremity: Secondary | ICD-10-CM | POA: Diagnosis not present

## 2021-07-06 DIAGNOSIS — Z9189 Other specified personal risk factors, not elsewhere classified: Secondary | ICD-10-CM | POA: Diagnosis not present

## 2021-07-06 DIAGNOSIS — Z789 Other specified health status: Secondary | ICD-10-CM | POA: Diagnosis not present

## 2021-07-06 DIAGNOSIS — T471X5A Adverse effect of other antacids and anti-gastric-secretion drugs, initial encounter: Secondary | ICD-10-CM | POA: Diagnosis not present

## 2021-07-06 DIAGNOSIS — R52 Pain, unspecified: Secondary | ICD-10-CM | POA: Diagnosis not present

## 2021-07-06 DIAGNOSIS — R579 Shock, unspecified: Secondary | ICD-10-CM | POA: Diagnosis not present

## 2021-07-06 DIAGNOSIS — E875 Hyperkalemia: Secondary | ICD-10-CM | POA: Diagnosis not present

## 2021-07-06 DIAGNOSIS — N185 Chronic kidney disease, stage 5: Secondary | ICD-10-CM | POA: Diagnosis not present

## 2021-07-06 DIAGNOSIS — R531 Weakness: Secondary | ICD-10-CM | POA: Diagnosis not present

## 2021-07-06 DIAGNOSIS — K631 Perforation of intestine (nontraumatic): Secondary | ICD-10-CM | POA: Diagnosis not present

## 2021-07-06 DIAGNOSIS — R600 Localized edema: Secondary | ICD-10-CM | POA: Diagnosis not present

## 2021-07-06 DIAGNOSIS — E785 Hyperlipidemia, unspecified: Secondary | ICD-10-CM | POA: Diagnosis not present

## 2021-07-06 DIAGNOSIS — E871 Hypo-osmolality and hyponatremia: Secondary | ICD-10-CM | POA: Diagnosis not present

## 2021-07-06 DIAGNOSIS — R109 Unspecified abdominal pain: Secondary | ICD-10-CM | POA: Diagnosis not present

## 2021-07-06 DIAGNOSIS — T85590A Other mechanical complication of bile duct prosthesis, initial encounter: Secondary | ICD-10-CM | POA: Diagnosis not present

## 2021-07-06 DIAGNOSIS — I959 Hypotension, unspecified: Secondary | ICD-10-CM | POA: Diagnosis not present

## 2021-07-06 DIAGNOSIS — J9602 Acute respiratory failure with hypercapnia: Secondary | ICD-10-CM | POA: Diagnosis not present

## 2021-07-06 DIAGNOSIS — G479 Sleep disorder, unspecified: Secondary | ICD-10-CM | POA: Diagnosis not present

## 2021-07-06 DIAGNOSIS — K838 Other specified diseases of biliary tract: Secondary | ICD-10-CM | POA: Diagnosis not present

## 2021-07-06 DIAGNOSIS — Z736 Limitation of activities due to disability: Secondary | ICD-10-CM | POA: Diagnosis not present

## 2021-07-06 DIAGNOSIS — R739 Hyperglycemia, unspecified: Secondary | ICD-10-CM | POA: Diagnosis not present

## 2021-07-06 DIAGNOSIS — Z792 Long term (current) use of antibiotics: Secondary | ICD-10-CM | POA: Diagnosis not present

## 2021-07-06 DIAGNOSIS — T368X5A Adverse effect of other systemic antibiotics, initial encounter: Secondary | ICD-10-CM | POA: Diagnosis not present

## 2021-07-06 DIAGNOSIS — D62 Acute posthemorrhagic anemia: Secondary | ICD-10-CM | POA: Diagnosis not present

## 2021-07-06 DIAGNOSIS — Z4682 Encounter for fitting and adjustment of non-vascular catheter: Secondary | ICD-10-CM | POA: Diagnosis not present

## 2021-07-06 DIAGNOSIS — A419 Sepsis, unspecified organism: Secondary | ICD-10-CM | POA: Diagnosis not present

## 2021-07-06 DIAGNOSIS — R5383 Other fatigue: Secondary | ICD-10-CM | POA: Diagnosis not present

## 2021-07-06 DIAGNOSIS — S36400D Unspecified injury of duodenum, subsequent encounter: Secondary | ICD-10-CM | POA: Diagnosis not present

## 2021-07-06 DIAGNOSIS — Z20822 Contact with and (suspected) exposure to covid-19: Secondary | ICD-10-CM | POA: Diagnosis not present

## 2021-07-06 DIAGNOSIS — M6259 Muscle wasting and atrophy, not elsewhere classified, multiple sites: Secondary | ICD-10-CM | POA: Diagnosis not present

## 2021-07-06 DIAGNOSIS — Z79899 Other long term (current) drug therapy: Secondary | ICD-10-CM | POA: Diagnosis not present

## 2021-07-06 DIAGNOSIS — I129 Hypertensive chronic kidney disease with stage 1 through stage 4 chronic kidney disease, or unspecified chronic kidney disease: Secondary | ICD-10-CM | POA: Diagnosis not present

## 2021-07-06 DIAGNOSIS — Z940 Kidney transplant status: Secondary | ICD-10-CM | POA: Diagnosis not present

## 2021-07-06 DIAGNOSIS — K265 Chronic or unspecified duodenal ulcer with perforation: Secondary | ICD-10-CM | POA: Diagnosis not present

## 2021-07-06 DIAGNOSIS — E569 Vitamin deficiency, unspecified: Secondary | ICD-10-CM | POA: Diagnosis not present

## 2021-07-06 DIAGNOSIS — E872 Acidosis, unspecified: Secondary | ICD-10-CM | POA: Diagnosis not present

## 2021-07-06 DIAGNOSIS — E1121 Type 2 diabetes mellitus with diabetic nephropathy: Secondary | ICD-10-CM | POA: Diagnosis not present

## 2021-07-06 DIAGNOSIS — M6250 Muscle wasting and atrophy, not elsewhere classified, unspecified site: Secondary | ICD-10-CM | POA: Diagnosis not present

## 2021-07-06 DIAGNOSIS — R188 Other ascites: Secondary | ICD-10-CM | POA: Diagnosis not present

## 2021-07-06 DIAGNOSIS — K9189 Other postprocedural complications and disorders of digestive system: Secondary | ICD-10-CM | POA: Diagnosis not present

## 2021-07-06 DIAGNOSIS — Y831 Surgical operation with implant of artificial internal device as the cause of abnormal reaction of the patient, or of later complication, without mention of misadventure at the time of the procedure: Secondary | ICD-10-CM | POA: Diagnosis not present

## 2021-07-06 DIAGNOSIS — D72829 Elevated white blood cell count, unspecified: Secondary | ICD-10-CM | POA: Diagnosis not present

## 2021-07-07 DIAGNOSIS — Z79899 Other long term (current) drug therapy: Secondary | ICD-10-CM | POA: Diagnosis not present

## 2021-07-07 DIAGNOSIS — N170 Acute kidney failure with tubular necrosis: Secondary | ICD-10-CM | POA: Diagnosis not present

## 2021-07-07 DIAGNOSIS — R1033 Periumbilical pain: Secondary | ICD-10-CM | POA: Diagnosis not present

## 2021-07-07 DIAGNOSIS — E1121 Type 2 diabetes mellitus with diabetic nephropathy: Secondary | ICD-10-CM | POA: Diagnosis not present

## 2021-07-07 DIAGNOSIS — R6521 Severe sepsis with septic shock: Secondary | ICD-10-CM | POA: Diagnosis not present

## 2021-07-07 DIAGNOSIS — A419 Sepsis, unspecified organism: Secondary | ICD-10-CM | POA: Diagnosis not present

## 2021-07-07 DIAGNOSIS — Z4682 Encounter for fitting and adjustment of non-vascular catheter: Secondary | ICD-10-CM | POA: Diagnosis not present

## 2021-07-07 DIAGNOSIS — Z940 Kidney transplant status: Secondary | ICD-10-CM | POA: Diagnosis not present

## 2021-07-07 DIAGNOSIS — Z792 Long term (current) use of antibiotics: Secondary | ICD-10-CM | POA: Diagnosis not present

## 2021-07-07 DIAGNOSIS — D849 Immunodeficiency, unspecified: Secondary | ICD-10-CM | POA: Diagnosis not present

## 2021-07-07 DIAGNOSIS — K631 Perforation of intestine (nontraumatic): Secondary | ICD-10-CM | POA: Diagnosis not present

## 2021-07-08 DIAGNOSIS — K631 Perforation of intestine (nontraumatic): Secondary | ICD-10-CM | POA: Diagnosis not present

## 2021-07-08 DIAGNOSIS — Z792 Long term (current) use of antibiotics: Secondary | ICD-10-CM | POA: Diagnosis not present

## 2021-07-08 DIAGNOSIS — E1121 Type 2 diabetes mellitus with diabetic nephropathy: Secondary | ICD-10-CM | POA: Diagnosis not present

## 2021-07-08 DIAGNOSIS — Z79899 Other long term (current) drug therapy: Secondary | ICD-10-CM | POA: Diagnosis not present

## 2021-07-08 DIAGNOSIS — Z940 Kidney transplant status: Secondary | ICD-10-CM | POA: Diagnosis not present

## 2021-07-08 DIAGNOSIS — D849 Immunodeficiency, unspecified: Secondary | ICD-10-CM | POA: Diagnosis not present

## 2021-07-08 DIAGNOSIS — R1033 Periumbilical pain: Secondary | ICD-10-CM | POA: Diagnosis not present

## 2021-07-08 DIAGNOSIS — N170 Acute kidney failure with tubular necrosis: Secondary | ICD-10-CM | POA: Diagnosis not present

## 2021-07-09 DIAGNOSIS — Z940 Kidney transplant status: Secondary | ICD-10-CM | POA: Diagnosis not present

## 2021-07-09 DIAGNOSIS — J9601 Acute respiratory failure with hypoxia: Secondary | ICD-10-CM | POA: Diagnosis not present

## 2021-07-09 DIAGNOSIS — R1033 Periumbilical pain: Secondary | ICD-10-CM | POA: Diagnosis not present

## 2021-07-09 DIAGNOSIS — E872 Acidosis, unspecified: Secondary | ICD-10-CM | POA: Diagnosis not present

## 2021-07-09 DIAGNOSIS — E875 Hyperkalemia: Secondary | ICD-10-CM | POA: Diagnosis not present

## 2021-07-09 DIAGNOSIS — A419 Sepsis, unspecified organism: Secondary | ICD-10-CM | POA: Diagnosis not present

## 2021-07-09 DIAGNOSIS — E1121 Type 2 diabetes mellitus with diabetic nephropathy: Secondary | ICD-10-CM | POA: Diagnosis not present

## 2021-07-09 DIAGNOSIS — G479 Sleep disorder, unspecified: Secondary | ICD-10-CM | POA: Diagnosis not present

## 2021-07-09 DIAGNOSIS — Z9189 Other specified personal risk factors, not elsewhere classified: Secondary | ICD-10-CM | POA: Diagnosis not present

## 2021-07-09 DIAGNOSIS — N170 Acute kidney failure with tubular necrosis: Secondary | ICD-10-CM | POA: Diagnosis not present

## 2021-07-09 DIAGNOSIS — K631 Perforation of intestine (nontraumatic): Secondary | ICD-10-CM | POA: Diagnosis not present

## 2021-07-10 DIAGNOSIS — Z792 Long term (current) use of antibiotics: Secondary | ICD-10-CM | POA: Diagnosis not present

## 2021-07-10 DIAGNOSIS — E875 Hyperkalemia: Secondary | ICD-10-CM | POA: Diagnosis not present

## 2021-07-10 DIAGNOSIS — J9601 Acute respiratory failure with hypoxia: Secondary | ICD-10-CM | POA: Diagnosis not present

## 2021-07-10 DIAGNOSIS — Z9189 Other specified personal risk factors, not elsewhere classified: Secondary | ICD-10-CM | POA: Diagnosis not present

## 2021-07-10 DIAGNOSIS — D849 Immunodeficiency, unspecified: Secondary | ICD-10-CM | POA: Diagnosis not present

## 2021-07-10 DIAGNOSIS — E872 Acidosis, unspecified: Secondary | ICD-10-CM | POA: Diagnosis not present

## 2021-07-10 DIAGNOSIS — K631 Perforation of intestine (nontraumatic): Secondary | ICD-10-CM | POA: Diagnosis not present

## 2021-07-10 DIAGNOSIS — Z940 Kidney transplant status: Secondary | ICD-10-CM | POA: Diagnosis not present

## 2021-07-10 DIAGNOSIS — R1033 Periumbilical pain: Secondary | ICD-10-CM | POA: Diagnosis not present

## 2021-07-10 DIAGNOSIS — N170 Acute kidney failure with tubular necrosis: Secondary | ICD-10-CM | POA: Diagnosis not present

## 2021-07-10 DIAGNOSIS — E1121 Type 2 diabetes mellitus with diabetic nephropathy: Secondary | ICD-10-CM | POA: Diagnosis not present

## 2021-07-11 DIAGNOSIS — R1033 Periumbilical pain: Secondary | ICD-10-CM | POA: Diagnosis not present

## 2021-07-11 DIAGNOSIS — K631 Perforation of intestine (nontraumatic): Secondary | ICD-10-CM | POA: Diagnosis not present

## 2021-07-11 DIAGNOSIS — Z792 Long term (current) use of antibiotics: Secondary | ICD-10-CM | POA: Diagnosis not present

## 2021-07-11 DIAGNOSIS — A419 Sepsis, unspecified organism: Secondary | ICD-10-CM | POA: Diagnosis not present

## 2021-07-11 DIAGNOSIS — Z940 Kidney transplant status: Secondary | ICD-10-CM | POA: Diagnosis not present

## 2021-07-11 DIAGNOSIS — E1121 Type 2 diabetes mellitus with diabetic nephropathy: Secondary | ICD-10-CM | POA: Diagnosis not present

## 2021-07-11 DIAGNOSIS — R6521 Severe sepsis with septic shock: Secondary | ICD-10-CM | POA: Diagnosis not present

## 2021-07-11 DIAGNOSIS — D849 Immunodeficiency, unspecified: Secondary | ICD-10-CM | POA: Diagnosis not present

## 2021-07-12 DIAGNOSIS — Z792 Long term (current) use of antibiotics: Secondary | ICD-10-CM | POA: Diagnosis not present

## 2021-07-12 DIAGNOSIS — D849 Immunodeficiency, unspecified: Secondary | ICD-10-CM | POA: Diagnosis not present

## 2021-07-12 DIAGNOSIS — E1121 Type 2 diabetes mellitus with diabetic nephropathy: Secondary | ICD-10-CM | POA: Diagnosis not present

## 2021-07-12 DIAGNOSIS — Z940 Kidney transplant status: Secondary | ICD-10-CM | POA: Diagnosis not present

## 2021-07-12 DIAGNOSIS — K631 Perforation of intestine (nontraumatic): Secondary | ICD-10-CM | POA: Diagnosis not present

## 2021-07-12 DIAGNOSIS — R1033 Periumbilical pain: Secondary | ICD-10-CM | POA: Diagnosis not present

## 2021-07-12 DIAGNOSIS — A419 Sepsis, unspecified organism: Secondary | ICD-10-CM | POA: Diagnosis not present

## 2021-07-12 DIAGNOSIS — R6521 Severe sepsis with septic shock: Secondary | ICD-10-CM | POA: Diagnosis not present

## 2021-07-13 DIAGNOSIS — R6521 Severe sepsis with septic shock: Secondary | ICD-10-CM | POA: Diagnosis not present

## 2021-07-13 DIAGNOSIS — Z940 Kidney transplant status: Secondary | ICD-10-CM | POA: Diagnosis not present

## 2021-07-13 DIAGNOSIS — A419 Sepsis, unspecified organism: Secondary | ICD-10-CM | POA: Diagnosis not present

## 2021-07-13 DIAGNOSIS — Z792 Long term (current) use of antibiotics: Secondary | ICD-10-CM | POA: Diagnosis not present

## 2021-07-13 DIAGNOSIS — K631 Perforation of intestine (nontraumatic): Secondary | ICD-10-CM | POA: Diagnosis not present

## 2021-07-13 DIAGNOSIS — E1121 Type 2 diabetes mellitus with diabetic nephropathy: Secondary | ICD-10-CM | POA: Diagnosis not present

## 2021-07-13 DIAGNOSIS — D849 Immunodeficiency, unspecified: Secondary | ICD-10-CM | POA: Diagnosis not present

## 2021-07-13 DIAGNOSIS — R1033 Periumbilical pain: Secondary | ICD-10-CM | POA: Diagnosis not present

## 2021-07-14 DIAGNOSIS — R1033 Periumbilical pain: Secondary | ICD-10-CM | POA: Diagnosis not present

## 2021-07-14 DIAGNOSIS — A419 Sepsis, unspecified organism: Secondary | ICD-10-CM | POA: Diagnosis not present

## 2021-07-14 DIAGNOSIS — D849 Immunodeficiency, unspecified: Secondary | ICD-10-CM | POA: Diagnosis not present

## 2021-07-14 DIAGNOSIS — K9189 Other postprocedural complications and disorders of digestive system: Secondary | ICD-10-CM | POA: Diagnosis not present

## 2021-07-14 DIAGNOSIS — Z940 Kidney transplant status: Secondary | ICD-10-CM | POA: Diagnosis not present

## 2021-07-14 DIAGNOSIS — K631 Perforation of intestine (nontraumatic): Secondary | ICD-10-CM | POA: Diagnosis not present

## 2021-07-14 DIAGNOSIS — R6521 Severe sepsis with septic shock: Secondary | ICD-10-CM | POA: Diagnosis not present

## 2021-07-14 DIAGNOSIS — Z792 Long term (current) use of antibiotics: Secondary | ICD-10-CM | POA: Diagnosis not present

## 2021-07-15 DIAGNOSIS — T380X5A Adverse effect of glucocorticoids and synthetic analogues, initial encounter: Secondary | ICD-10-CM | POA: Diagnosis not present

## 2021-07-15 DIAGNOSIS — A419 Sepsis, unspecified organism: Secondary | ICD-10-CM | POA: Diagnosis not present

## 2021-07-15 DIAGNOSIS — K631 Perforation of intestine (nontraumatic): Secondary | ICD-10-CM | POA: Diagnosis not present

## 2021-07-15 DIAGNOSIS — R739 Hyperglycemia, unspecified: Secondary | ICD-10-CM | POA: Diagnosis not present

## 2021-07-15 DIAGNOSIS — Z940 Kidney transplant status: Secondary | ICD-10-CM | POA: Diagnosis not present

## 2021-07-15 DIAGNOSIS — Z792 Long term (current) use of antibiotics: Secondary | ICD-10-CM | POA: Diagnosis not present

## 2021-07-15 DIAGNOSIS — Z789 Other specified health status: Secondary | ICD-10-CM | POA: Diagnosis not present

## 2021-07-15 DIAGNOSIS — R6521 Severe sepsis with septic shock: Secondary | ICD-10-CM | POA: Diagnosis not present

## 2021-07-15 DIAGNOSIS — R1033 Periumbilical pain: Secondary | ICD-10-CM | POA: Diagnosis not present

## 2021-07-15 DIAGNOSIS — D849 Immunodeficiency, unspecified: Secondary | ICD-10-CM | POA: Diagnosis not present

## 2021-07-15 DIAGNOSIS — E1121 Type 2 diabetes mellitus with diabetic nephropathy: Secondary | ICD-10-CM | POA: Diagnosis not present

## 2021-07-16 DIAGNOSIS — T380X5A Adverse effect of glucocorticoids and synthetic analogues, initial encounter: Secondary | ICD-10-CM | POA: Diagnosis not present

## 2021-07-16 DIAGNOSIS — R739 Hyperglycemia, unspecified: Secondary | ICD-10-CM | POA: Diagnosis not present

## 2021-07-16 DIAGNOSIS — R1033 Periumbilical pain: Secondary | ICD-10-CM | POA: Diagnosis not present

## 2021-07-16 DIAGNOSIS — Z789 Other specified health status: Secondary | ICD-10-CM | POA: Diagnosis not present

## 2021-07-16 DIAGNOSIS — E1121 Type 2 diabetes mellitus with diabetic nephropathy: Secondary | ICD-10-CM | POA: Diagnosis not present

## 2021-07-17 DIAGNOSIS — E1121 Type 2 diabetes mellitus with diabetic nephropathy: Secondary | ICD-10-CM | POA: Diagnosis not present

## 2021-07-17 DIAGNOSIS — T380X5A Adverse effect of glucocorticoids and synthetic analogues, initial encounter: Secondary | ICD-10-CM | POA: Diagnosis not present

## 2021-07-17 DIAGNOSIS — Z789 Other specified health status: Secondary | ICD-10-CM | POA: Diagnosis not present

## 2021-07-17 DIAGNOSIS — R739 Hyperglycemia, unspecified: Secondary | ICD-10-CM | POA: Diagnosis not present

## 2021-07-18 DIAGNOSIS — R739 Hyperglycemia, unspecified: Secondary | ICD-10-CM | POA: Diagnosis not present

## 2021-07-18 DIAGNOSIS — R1033 Periumbilical pain: Secondary | ICD-10-CM | POA: Diagnosis not present

## 2021-07-18 DIAGNOSIS — E1121 Type 2 diabetes mellitus with diabetic nephropathy: Secondary | ICD-10-CM | POA: Diagnosis not present

## 2021-07-18 DIAGNOSIS — Z792 Long term (current) use of antibiotics: Secondary | ICD-10-CM | POA: Diagnosis not present

## 2021-07-18 DIAGNOSIS — Z940 Kidney transplant status: Secondary | ICD-10-CM | POA: Diagnosis not present

## 2021-07-18 DIAGNOSIS — Z79899 Other long term (current) drug therapy: Secondary | ICD-10-CM | POA: Diagnosis not present

## 2021-07-18 DIAGNOSIS — Z789 Other specified health status: Secondary | ICD-10-CM | POA: Diagnosis not present

## 2021-07-18 DIAGNOSIS — T380X5A Adverse effect of glucocorticoids and synthetic analogues, initial encounter: Secondary | ICD-10-CM | POA: Diagnosis not present

## 2021-07-18 DIAGNOSIS — K631 Perforation of intestine (nontraumatic): Secondary | ICD-10-CM | POA: Diagnosis not present

## 2021-07-21 DIAGNOSIS — T85590A Other mechanical complication of bile duct prosthesis, initial encounter: Secondary | ICD-10-CM | POA: Diagnosis not present

## 2021-07-21 DIAGNOSIS — Y831 Surgical operation with implant of artificial internal device as the cause of abnormal reaction of the patient, or of later complication, without mention of misadventure at the time of the procedure: Secondary | ICD-10-CM | POA: Diagnosis not present

## 2021-07-25 ENCOUNTER — Encounter: Payer: Medicare Other | Admitting: Family Medicine

## 2021-07-26 DIAGNOSIS — D849 Immunodeficiency, unspecified: Secondary | ICD-10-CM | POA: Diagnosis not present

## 2021-07-26 DIAGNOSIS — M6281 Muscle weakness (generalized): Secondary | ICD-10-CM | POA: Diagnosis not present

## 2021-07-26 DIAGNOSIS — D509 Iron deficiency anemia, unspecified: Secondary | ICD-10-CM | POA: Diagnosis not present

## 2021-07-26 DIAGNOSIS — K219 Gastro-esophageal reflux disease without esophagitis: Secondary | ICD-10-CM | POA: Diagnosis not present

## 2021-07-26 DIAGNOSIS — M6250 Muscle wasting and atrophy, not elsewhere classified, unspecified site: Secondary | ICD-10-CM | POA: Diagnosis not present

## 2021-07-26 DIAGNOSIS — Z940 Kidney transplant status: Secondary | ICD-10-CM | POA: Diagnosis not present

## 2021-07-26 DIAGNOSIS — I10 Essential (primary) hypertension: Secondary | ICD-10-CM | POA: Diagnosis not present

## 2021-07-26 DIAGNOSIS — E569 Vitamin deficiency, unspecified: Secondary | ICD-10-CM | POA: Diagnosis not present

## 2021-07-26 DIAGNOSIS — K8591 Acute pancreatitis with uninfected necrosis, unspecified: Secondary | ICD-10-CM | POA: Diagnosis not present

## 2021-07-26 DIAGNOSIS — Z299 Encounter for prophylactic measures, unspecified: Secondary | ICD-10-CM | POA: Diagnosis not present

## 2021-07-26 DIAGNOSIS — E119 Type 2 diabetes mellitus without complications: Secondary | ICD-10-CM | POA: Diagnosis not present

## 2021-07-26 DIAGNOSIS — L0390 Cellulitis, unspecified: Secondary | ICD-10-CM | POA: Diagnosis not present

## 2021-07-26 DIAGNOSIS — M6259 Muscle wasting and atrophy, not elsewhere classified, multiple sites: Secondary | ICD-10-CM | POA: Diagnosis not present

## 2021-07-26 DIAGNOSIS — Z736 Limitation of activities due to disability: Secondary | ICD-10-CM | POA: Diagnosis not present

## 2021-07-26 DIAGNOSIS — E785 Hyperlipidemia, unspecified: Secondary | ICD-10-CM | POA: Diagnosis not present

## 2021-07-26 DIAGNOSIS — R52 Pain, unspecified: Secondary | ICD-10-CM | POA: Diagnosis not present

## 2021-07-26 DIAGNOSIS — R531 Weakness: Secondary | ICD-10-CM | POA: Diagnosis not present

## 2021-07-26 DIAGNOSIS — D696 Thrombocytopenia, unspecified: Secondary | ICD-10-CM | POA: Diagnosis not present

## 2021-07-26 DIAGNOSIS — S36400D Unspecified injury of duodenum, subsequent encounter: Secondary | ICD-10-CM | POA: Diagnosis not present

## 2021-07-26 DIAGNOSIS — E118 Type 2 diabetes mellitus with unspecified complications: Secondary | ICD-10-CM | POA: Diagnosis not present

## 2021-07-26 DIAGNOSIS — R5383 Other fatigue: Secondary | ICD-10-CM | POA: Diagnosis not present

## 2021-07-27 DIAGNOSIS — D509 Iron deficiency anemia, unspecified: Secondary | ICD-10-CM | POA: Diagnosis not present

## 2021-07-27 DIAGNOSIS — S36400D Unspecified injury of duodenum, subsequent encounter: Secondary | ICD-10-CM | POA: Diagnosis not present

## 2021-07-27 DIAGNOSIS — M6281 Muscle weakness (generalized): Secondary | ICD-10-CM | POA: Diagnosis not present

## 2021-07-27 DIAGNOSIS — D696 Thrombocytopenia, unspecified: Secondary | ICD-10-CM | POA: Diagnosis not present

## 2021-07-27 DIAGNOSIS — E118 Type 2 diabetes mellitus with unspecified complications: Secondary | ICD-10-CM | POA: Diagnosis not present

## 2021-07-27 DIAGNOSIS — Z940 Kidney transplant status: Secondary | ICD-10-CM | POA: Diagnosis not present

## 2021-07-28 DIAGNOSIS — K8591 Acute pancreatitis with uninfected necrosis, unspecified: Secondary | ICD-10-CM | POA: Diagnosis not present

## 2021-07-28 DIAGNOSIS — M6281 Muscle weakness (generalized): Secondary | ICD-10-CM | POA: Diagnosis not present

## 2021-07-28 DIAGNOSIS — S36400D Unspecified injury of duodenum, subsequent encounter: Secondary | ICD-10-CM | POA: Diagnosis not present

## 2021-07-28 DIAGNOSIS — E118 Type 2 diabetes mellitus with unspecified complications: Secondary | ICD-10-CM | POA: Diagnosis not present

## 2021-07-28 DIAGNOSIS — D696 Thrombocytopenia, unspecified: Secondary | ICD-10-CM | POA: Diagnosis not present

## 2021-07-28 DIAGNOSIS — D509 Iron deficiency anemia, unspecified: Secondary | ICD-10-CM | POA: Diagnosis not present

## 2021-07-28 DIAGNOSIS — Z940 Kidney transplant status: Secondary | ICD-10-CM | POA: Diagnosis not present

## 2021-07-29 DIAGNOSIS — E119 Type 2 diabetes mellitus without complications: Secondary | ICD-10-CM | POA: Diagnosis not present

## 2021-07-29 DIAGNOSIS — D509 Iron deficiency anemia, unspecified: Secondary | ICD-10-CM | POA: Diagnosis not present

## 2021-07-29 DIAGNOSIS — Z940 Kidney transplant status: Secondary | ICD-10-CM | POA: Diagnosis not present

## 2021-07-29 DIAGNOSIS — D696 Thrombocytopenia, unspecified: Secondary | ICD-10-CM | POA: Diagnosis not present

## 2021-07-29 DIAGNOSIS — S36400D Unspecified injury of duodenum, subsequent encounter: Secondary | ICD-10-CM | POA: Diagnosis not present

## 2021-08-01 DIAGNOSIS — S36400D Unspecified injury of duodenum, subsequent encounter: Secondary | ICD-10-CM | POA: Diagnosis not present

## 2021-08-03 DIAGNOSIS — S36400D Unspecified injury of duodenum, subsequent encounter: Secondary | ICD-10-CM | POA: Diagnosis not present

## 2021-08-03 DIAGNOSIS — D849 Immunodeficiency, unspecified: Secondary | ICD-10-CM | POA: Diagnosis not present

## 2021-08-03 DIAGNOSIS — Z940 Kidney transplant status: Secondary | ICD-10-CM | POA: Diagnosis not present

## 2021-08-05 ENCOUNTER — Telehealth: Payer: Self-pay | Admitting: *Deleted

## 2021-08-05 DIAGNOSIS — S36400D Unspecified injury of duodenum, subsequent encounter: Secondary | ICD-10-CM | POA: Diagnosis not present

## 2021-08-05 DIAGNOSIS — E118 Type 2 diabetes mellitus with unspecified complications: Secondary | ICD-10-CM | POA: Diagnosis not present

## 2021-08-05 DIAGNOSIS — D696 Thrombocytopenia, unspecified: Secondary | ICD-10-CM | POA: Diagnosis not present

## 2021-08-05 DIAGNOSIS — D509 Iron deficiency anemia, unspecified: Secondary | ICD-10-CM | POA: Diagnosis not present

## 2021-08-05 DIAGNOSIS — Z940 Kidney transplant status: Secondary | ICD-10-CM | POA: Diagnosis not present

## 2021-08-05 DIAGNOSIS — M6281 Muscle weakness (generalized): Secondary | ICD-10-CM | POA: Diagnosis not present

## 2021-08-05 NOTE — Telephone Encounter (Unsigned)
Copied from [place] [phone]. Topic: General - Other >> Aug 05, 2021  9:35 AM Parke Poisson wrote: Reason for CRM: Santiago Glad from [facility] would like to know if you will sign orders for pt to receive services from them..Pt was at [facility] and then sent to [facility]. She will be released on 08/10/21 and they will start services on 08/11/21 if she can get a verbal.Pt will need drain management and insulin injection education Contact number is [phone]

## 2021-08-05 NOTE — Telephone Encounter (Signed)
We can complete these when they arrive, but I am out of office next week, so someone else will need to sign

## 2021-08-08 NOTE — Telephone Encounter (Signed)
Debra Paul only needed verbal order so, this was given.    She did state that patient has not been seen in a while and needed follow up appointment with [place].

## 2021-08-10 DIAGNOSIS — Z794 Long term (current) use of insulin: Secondary | ICD-10-CM | POA: Diagnosis not present

## 2021-08-10 DIAGNOSIS — N179 Acute kidney failure, unspecified: Secondary | ICD-10-CM | POA: Diagnosis not present

## 2021-08-10 DIAGNOSIS — Z79899 Other long term (current) drug therapy: Secondary | ICD-10-CM | POA: Diagnosis not present

## 2021-08-10 DIAGNOSIS — I10 Essential (primary) hypertension: Secondary | ICD-10-CM | POA: Diagnosis not present

## 2021-08-10 DIAGNOSIS — Z792 Long term (current) use of antibiotics: Secondary | ICD-10-CM | POA: Diagnosis not present

## 2021-08-10 DIAGNOSIS — E878 Other disorders of electrolyte and fluid balance, not elsewhere classified: Secondary | ICD-10-CM | POA: Diagnosis not present

## 2021-08-10 DIAGNOSIS — Z940 Kidney transplant status: Secondary | ICD-10-CM | POA: Diagnosis not present

## 2021-08-10 DIAGNOSIS — K9189 Other postprocedural complications and disorders of digestive system: Secondary | ICD-10-CM | POA: Diagnosis not present

## 2021-08-10 DIAGNOSIS — E1121 Type 2 diabetes mellitus with diabetic nephropathy: Secondary | ICD-10-CM | POA: Diagnosis not present

## 2021-08-10 DIAGNOSIS — D849 Immunodeficiency, unspecified: Secondary | ICD-10-CM | POA: Diagnosis not present

## 2021-08-10 DIAGNOSIS — T8619 Other complication of kidney transplant: Secondary | ICD-10-CM | POA: Diagnosis not present

## 2021-08-10 DIAGNOSIS — D84821 Immunodeficiency due to drugs: Secondary | ICD-10-CM | POA: Diagnosis not present

## 2021-08-10 DIAGNOSIS — K265 Chronic or unspecified duodenal ulcer with perforation: Secondary | ICD-10-CM | POA: Diagnosis not present

## 2021-08-10 DIAGNOSIS — E1165 Type 2 diabetes mellitus with hyperglycemia: Secondary | ICD-10-CM | POA: Diagnosis not present

## 2021-08-10 NOTE — Telephone Encounter (Signed)
Left vm for pt to call back to schedule

## 2021-08-11 DIAGNOSIS — R2681 Unsteadiness on feet: Secondary | ICD-10-CM | POA: Diagnosis not present

## 2021-08-11 DIAGNOSIS — Z9181 History of falling: Secondary | ICD-10-CM | POA: Diagnosis not present

## 2021-08-11 DIAGNOSIS — Z79621 Long term (current) use of calcineurin inhibitor: Secondary | ICD-10-CM | POA: Diagnosis not present

## 2021-08-11 DIAGNOSIS — N189 Chronic kidney disease, unspecified: Secondary | ICD-10-CM | POA: Diagnosis not present

## 2021-08-11 DIAGNOSIS — Z940 Kidney transplant status: Secondary | ICD-10-CM | POA: Diagnosis not present

## 2021-08-11 DIAGNOSIS — Z7952 Long term (current) use of systemic steroids: Secondary | ICD-10-CM | POA: Diagnosis not present

## 2021-08-11 DIAGNOSIS — Z7984 Long term (current) use of oral hypoglycemic drugs: Secondary | ICD-10-CM | POA: Diagnosis not present

## 2021-08-11 DIAGNOSIS — K261 Acute duodenal ulcer with perforation: Secondary | ICD-10-CM | POA: Diagnosis not present

## 2021-08-11 DIAGNOSIS — E1122 Type 2 diabetes mellitus with diabetic chronic kidney disease: Secondary | ICD-10-CM | POA: Diagnosis not present

## 2021-08-11 DIAGNOSIS — I129 Hypertensive chronic kidney disease with stage 1 through stage 4 chronic kidney disease, or unspecified chronic kidney disease: Secondary | ICD-10-CM | POA: Diagnosis not present

## 2021-08-11 DIAGNOSIS — M6281 Muscle weakness (generalized): Secondary | ICD-10-CM | POA: Diagnosis not present

## 2021-08-11 DIAGNOSIS — Z4801 Encounter for change or removal of surgical wound dressing: Secondary | ICD-10-CM | POA: Diagnosis not present

## 2021-08-15 DIAGNOSIS — N189 Chronic kidney disease, unspecified: Secondary | ICD-10-CM | POA: Diagnosis not present

## 2021-08-15 DIAGNOSIS — Z79621 Long term (current) use of calcineurin inhibitor: Secondary | ICD-10-CM | POA: Diagnosis not present

## 2021-08-15 DIAGNOSIS — Z940 Kidney transplant status: Secondary | ICD-10-CM | POA: Diagnosis not present

## 2021-08-15 DIAGNOSIS — R2681 Unsteadiness on feet: Secondary | ICD-10-CM | POA: Diagnosis not present

## 2021-08-15 DIAGNOSIS — Z7984 Long term (current) use of oral hypoglycemic drugs: Secondary | ICD-10-CM | POA: Diagnosis not present

## 2021-08-15 DIAGNOSIS — M6281 Muscle weakness (generalized): Secondary | ICD-10-CM | POA: Diagnosis not present

## 2021-08-15 DIAGNOSIS — E1122 Type 2 diabetes mellitus with diabetic chronic kidney disease: Secondary | ICD-10-CM | POA: Diagnosis not present

## 2021-08-15 DIAGNOSIS — Z4801 Encounter for change or removal of surgical wound dressing: Secondary | ICD-10-CM | POA: Diagnosis not present

## 2021-08-15 DIAGNOSIS — Z9181 History of falling: Secondary | ICD-10-CM | POA: Diagnosis not present

## 2021-08-15 DIAGNOSIS — I129 Hypertensive chronic kidney disease with stage 1 through stage 4 chronic kidney disease, or unspecified chronic kidney disease: Secondary | ICD-10-CM | POA: Diagnosis not present

## 2021-08-15 DIAGNOSIS — Z7952 Long term (current) use of systemic steroids: Secondary | ICD-10-CM | POA: Diagnosis not present

## 2021-08-15 DIAGNOSIS — K261 Acute duodenal ulcer with perforation: Secondary | ICD-10-CM | POA: Diagnosis not present

## 2021-08-16 DIAGNOSIS — E1121 Type 2 diabetes mellitus with diabetic nephropathy: Secondary | ICD-10-CM | POA: Diagnosis not present

## 2021-08-16 DIAGNOSIS — I959 Hypotension, unspecified: Secondary | ICD-10-CM | POA: Diagnosis not present

## 2021-08-16 LAB — HEMOGLOBIN A1C: Hemoglobin A1C: 7

## 2021-08-17 DIAGNOSIS — T380X5A Adverse effect of glucocorticoids and synthetic analogues, initial encounter: Secondary | ICD-10-CM | POA: Diagnosis not present

## 2021-08-17 DIAGNOSIS — Z8711 Personal history of peptic ulcer disease: Secondary | ICD-10-CM | POA: Diagnosis not present

## 2021-08-17 DIAGNOSIS — E878 Other disorders of electrolyte and fluid balance, not elsewhere classified: Secondary | ICD-10-CM | POA: Diagnosis not present

## 2021-08-17 DIAGNOSIS — D849 Immunodeficiency, unspecified: Secondary | ICD-10-CM | POA: Diagnosis not present

## 2021-08-17 DIAGNOSIS — I129 Hypertensive chronic kidney disease with stage 1 through stage 4 chronic kidney disease, or unspecified chronic kidney disease: Secondary | ICD-10-CM | POA: Diagnosis not present

## 2021-08-17 DIAGNOSIS — E1121 Type 2 diabetes mellitus with diabetic nephropathy: Secondary | ICD-10-CM | POA: Diagnosis not present

## 2021-08-17 DIAGNOSIS — D72829 Elevated white blood cell count, unspecified: Secondary | ICD-10-CM | POA: Diagnosis not present

## 2021-08-17 DIAGNOSIS — Z87442 Personal history of urinary calculi: Secondary | ICD-10-CM | POA: Diagnosis not present

## 2021-08-17 DIAGNOSIS — Z792 Long term (current) use of antibiotics: Secondary | ICD-10-CM | POA: Diagnosis not present

## 2021-08-17 DIAGNOSIS — E1165 Type 2 diabetes mellitus with hyperglycemia: Secondary | ICD-10-CM | POA: Diagnosis not present

## 2021-08-17 DIAGNOSIS — I10 Essential (primary) hypertension: Secondary | ICD-10-CM | POA: Diagnosis not present

## 2021-08-17 DIAGNOSIS — N184 Chronic kidney disease, stage 4 (severe): Secondary | ICD-10-CM | POA: Diagnosis not present

## 2021-08-17 DIAGNOSIS — R739 Hyperglycemia, unspecified: Secondary | ICD-10-CM | POA: Diagnosis not present

## 2021-08-17 DIAGNOSIS — Z79899 Other long term (current) drug therapy: Secondary | ICD-10-CM | POA: Diagnosis not present

## 2021-08-17 DIAGNOSIS — E1122 Type 2 diabetes mellitus with diabetic chronic kidney disease: Secondary | ICD-10-CM | POA: Diagnosis not present

## 2021-08-17 DIAGNOSIS — Z4822 Encounter for aftercare following kidney transplant: Secondary | ICD-10-CM | POA: Diagnosis not present

## 2021-08-17 DIAGNOSIS — Z79621 Long term (current) use of calcineurin inhibitor: Secondary | ICD-10-CM | POA: Diagnosis not present

## 2021-08-17 DIAGNOSIS — K631 Perforation of intestine (nontraumatic): Secondary | ICD-10-CM | POA: Diagnosis not present

## 2021-08-17 DIAGNOSIS — I959 Hypotension, unspecified: Secondary | ICD-10-CM | POA: Diagnosis not present

## 2021-08-17 DIAGNOSIS — Z940 Kidney transplant status: Secondary | ICD-10-CM | POA: Diagnosis not present

## 2021-08-17 DIAGNOSIS — K265 Chronic or unspecified duodenal ulcer with perforation: Secondary | ICD-10-CM | POA: Diagnosis not present

## 2021-08-17 DIAGNOSIS — Z87440 Personal history of urinary (tract) infections: Secondary | ICD-10-CM | POA: Diagnosis not present

## 2021-08-17 LAB — MICROALBUMIN, URINE: Microalb, Ur: 35.9

## 2021-08-17 LAB — MICROALBUMIN / CREATININE URINE RATIO: Microalb Creat Ratio: 18.6

## 2021-08-19 DIAGNOSIS — E1122 Type 2 diabetes mellitus with diabetic chronic kidney disease: Secondary | ICD-10-CM | POA: Diagnosis not present

## 2021-08-19 DIAGNOSIS — N189 Chronic kidney disease, unspecified: Secondary | ICD-10-CM | POA: Diagnosis not present

## 2021-08-19 DIAGNOSIS — Z79621 Long term (current) use of calcineurin inhibitor: Secondary | ICD-10-CM | POA: Diagnosis not present

## 2021-08-19 DIAGNOSIS — K261 Acute duodenal ulcer with perforation: Secondary | ICD-10-CM | POA: Diagnosis not present

## 2021-08-19 DIAGNOSIS — Z4801 Encounter for change or removal of surgical wound dressing: Secondary | ICD-10-CM | POA: Diagnosis not present

## 2021-08-19 DIAGNOSIS — R2681 Unsteadiness on feet: Secondary | ICD-10-CM | POA: Diagnosis not present

## 2021-08-19 DIAGNOSIS — Z940 Kidney transplant status: Secondary | ICD-10-CM | POA: Diagnosis not present

## 2021-08-19 DIAGNOSIS — Z9181 History of falling: Secondary | ICD-10-CM | POA: Diagnosis not present

## 2021-08-19 DIAGNOSIS — Z7952 Long term (current) use of systemic steroids: Secondary | ICD-10-CM | POA: Diagnosis not present

## 2021-08-19 DIAGNOSIS — M6281 Muscle weakness (generalized): Secondary | ICD-10-CM | POA: Diagnosis not present

## 2021-08-19 DIAGNOSIS — Z7984 Long term (current) use of oral hypoglycemic drugs: Secondary | ICD-10-CM | POA: Diagnosis not present

## 2021-08-19 DIAGNOSIS — I129 Hypertensive chronic kidney disease with stage 1 through stage 4 chronic kidney disease, or unspecified chronic kidney disease: Secondary | ICD-10-CM | POA: Diagnosis not present

## 2021-08-20 DIAGNOSIS — Z7984 Long term (current) use of oral hypoglycemic drugs: Secondary | ICD-10-CM | POA: Diagnosis not present

## 2021-08-20 DIAGNOSIS — Z9181 History of falling: Secondary | ICD-10-CM | POA: Diagnosis not present

## 2021-08-20 DIAGNOSIS — Z79621 Long term (current) use of calcineurin inhibitor: Secondary | ICD-10-CM | POA: Diagnosis not present

## 2021-08-20 DIAGNOSIS — Z7952 Long term (current) use of systemic steroids: Secondary | ICD-10-CM | POA: Diagnosis not present

## 2021-08-20 DIAGNOSIS — Z4801 Encounter for change or removal of surgical wound dressing: Secondary | ICD-10-CM | POA: Diagnosis not present

## 2021-08-20 DIAGNOSIS — E1122 Type 2 diabetes mellitus with diabetic chronic kidney disease: Secondary | ICD-10-CM | POA: Diagnosis not present

## 2021-08-20 DIAGNOSIS — M6281 Muscle weakness (generalized): Secondary | ICD-10-CM | POA: Diagnosis not present

## 2021-08-20 DIAGNOSIS — Z940 Kidney transplant status: Secondary | ICD-10-CM | POA: Diagnosis not present

## 2021-08-20 DIAGNOSIS — N189 Chronic kidney disease, unspecified: Secondary | ICD-10-CM | POA: Diagnosis not present

## 2021-08-20 DIAGNOSIS — R2681 Unsteadiness on feet: Secondary | ICD-10-CM | POA: Diagnosis not present

## 2021-08-20 DIAGNOSIS — K261 Acute duodenal ulcer with perforation: Secondary | ICD-10-CM | POA: Diagnosis not present

## 2021-08-20 DIAGNOSIS — I129 Hypertensive chronic kidney disease with stage 1 through stage 4 chronic kidney disease, or unspecified chronic kidney disease: Secondary | ICD-10-CM | POA: Diagnosis not present

## 2021-08-23 DIAGNOSIS — E1122 Type 2 diabetes mellitus with diabetic chronic kidney disease: Secondary | ICD-10-CM | POA: Diagnosis not present

## 2021-08-23 DIAGNOSIS — N189 Chronic kidney disease, unspecified: Secondary | ICD-10-CM | POA: Diagnosis not present

## 2021-08-23 DIAGNOSIS — Z7952 Long term (current) use of systemic steroids: Secondary | ICD-10-CM | POA: Diagnosis not present

## 2021-08-23 DIAGNOSIS — Z4801 Encounter for change or removal of surgical wound dressing: Secondary | ICD-10-CM | POA: Diagnosis not present

## 2021-08-23 DIAGNOSIS — Z7984 Long term (current) use of oral hypoglycemic drugs: Secondary | ICD-10-CM | POA: Diagnosis not present

## 2021-08-23 DIAGNOSIS — I129 Hypertensive chronic kidney disease with stage 1 through stage 4 chronic kidney disease, or unspecified chronic kidney disease: Secondary | ICD-10-CM | POA: Diagnosis not present

## 2021-08-23 DIAGNOSIS — K261 Acute duodenal ulcer with perforation: Secondary | ICD-10-CM | POA: Diagnosis not present

## 2021-08-23 DIAGNOSIS — Z79621 Long term (current) use of calcineurin inhibitor: Secondary | ICD-10-CM | POA: Diagnosis not present

## 2021-08-23 DIAGNOSIS — M6281 Muscle weakness (generalized): Secondary | ICD-10-CM | POA: Diagnosis not present

## 2021-08-23 DIAGNOSIS — Z9181 History of falling: Secondary | ICD-10-CM | POA: Diagnosis not present

## 2021-08-23 DIAGNOSIS — Z940 Kidney transplant status: Secondary | ICD-10-CM | POA: Diagnosis not present

## 2021-08-23 DIAGNOSIS — R2681 Unsteadiness on feet: Secondary | ICD-10-CM | POA: Diagnosis not present

## 2021-08-25 DIAGNOSIS — Z4801 Encounter for change or removal of surgical wound dressing: Secondary | ICD-10-CM | POA: Diagnosis not present

## 2021-08-25 DIAGNOSIS — R2681 Unsteadiness on feet: Secondary | ICD-10-CM | POA: Diagnosis not present

## 2021-08-25 DIAGNOSIS — Z7984 Long term (current) use of oral hypoglycemic drugs: Secondary | ICD-10-CM | POA: Diagnosis not present

## 2021-08-25 DIAGNOSIS — K261 Acute duodenal ulcer with perforation: Secondary | ICD-10-CM | POA: Diagnosis not present

## 2021-08-25 DIAGNOSIS — Z79621 Long term (current) use of calcineurin inhibitor: Secondary | ICD-10-CM | POA: Diagnosis not present

## 2021-08-25 DIAGNOSIS — M6281 Muscle weakness (generalized): Secondary | ICD-10-CM | POA: Diagnosis not present

## 2021-08-25 DIAGNOSIS — N189 Chronic kidney disease, unspecified: Secondary | ICD-10-CM | POA: Diagnosis not present

## 2021-08-25 DIAGNOSIS — E1122 Type 2 diabetes mellitus with diabetic chronic kidney disease: Secondary | ICD-10-CM | POA: Diagnosis not present

## 2021-08-25 DIAGNOSIS — Z9181 History of falling: Secondary | ICD-10-CM | POA: Diagnosis not present

## 2021-08-25 DIAGNOSIS — Z940 Kidney transplant status: Secondary | ICD-10-CM | POA: Diagnosis not present

## 2021-08-25 DIAGNOSIS — I129 Hypertensive chronic kidney disease with stage 1 through stage 4 chronic kidney disease, or unspecified chronic kidney disease: Secondary | ICD-10-CM | POA: Diagnosis not present

## 2021-08-25 DIAGNOSIS — Z7952 Long term (current) use of systemic steroids: Secondary | ICD-10-CM | POA: Diagnosis not present

## 2021-08-29 DIAGNOSIS — I129 Hypertensive chronic kidney disease with stage 1 through stage 4 chronic kidney disease, or unspecified chronic kidney disease: Secondary | ICD-10-CM | POA: Diagnosis not present

## 2021-08-29 DIAGNOSIS — I2510 Atherosclerotic heart disease of native coronary artery without angina pectoris: Secondary | ICD-10-CM | POA: Diagnosis not present

## 2021-08-29 DIAGNOSIS — E7800 Pure hypercholesterolemia, unspecified: Secondary | ICD-10-CM | POA: Diagnosis not present

## 2021-08-29 DIAGNOSIS — Z940 Kidney transplant status: Secondary | ICD-10-CM | POA: Diagnosis not present

## 2021-08-29 DIAGNOSIS — Z9181 History of falling: Secondary | ICD-10-CM | POA: Diagnosis not present

## 2021-08-29 DIAGNOSIS — I10 Essential (primary) hypertension: Secondary | ICD-10-CM | POA: Diagnosis not present

## 2021-08-29 DIAGNOSIS — Z4801 Encounter for change or removal of surgical wound dressing: Secondary | ICD-10-CM | POA: Diagnosis not present

## 2021-08-29 DIAGNOSIS — E1122 Type 2 diabetes mellitus with diabetic chronic kidney disease: Secondary | ICD-10-CM | POA: Diagnosis not present

## 2021-08-29 DIAGNOSIS — Z79621 Long term (current) use of calcineurin inhibitor: Secondary | ICD-10-CM | POA: Diagnosis not present

## 2021-08-29 DIAGNOSIS — K261 Acute duodenal ulcer with perforation: Secondary | ICD-10-CM | POA: Diagnosis not present

## 2021-08-29 DIAGNOSIS — N189 Chronic kidney disease, unspecified: Secondary | ICD-10-CM | POA: Diagnosis not present

## 2021-08-29 DIAGNOSIS — Z7984 Long term (current) use of oral hypoglycemic drugs: Secondary | ICD-10-CM | POA: Diagnosis not present

## 2021-08-29 DIAGNOSIS — R2681 Unsteadiness on feet: Secondary | ICD-10-CM | POA: Diagnosis not present

## 2021-08-29 DIAGNOSIS — Z7952 Long term (current) use of systemic steroids: Secondary | ICD-10-CM | POA: Diagnosis not present

## 2021-08-29 DIAGNOSIS — M6281 Muscle weakness (generalized): Secondary | ICD-10-CM | POA: Diagnosis not present

## 2021-08-31 DIAGNOSIS — E119 Type 2 diabetes mellitus without complications: Secondary | ICD-10-CM | POA: Diagnosis not present

## 2021-08-31 DIAGNOSIS — K252 Acute gastric ulcer with both hemorrhage and perforation: Secondary | ICD-10-CM | POA: Diagnosis not present

## 2021-08-31 DIAGNOSIS — I959 Hypotension, unspecified: Secondary | ICD-10-CM | POA: Diagnosis not present

## 2021-08-31 DIAGNOSIS — R1084 Generalized abdominal pain: Secondary | ICD-10-CM | POA: Diagnosis not present

## 2021-08-31 DIAGNOSIS — E1121 Type 2 diabetes mellitus with diabetic nephropathy: Secondary | ICD-10-CM | POA: Diagnosis not present

## 2021-08-31 DIAGNOSIS — K265 Chronic or unspecified duodenal ulcer with perforation: Secondary | ICD-10-CM | POA: Diagnosis not present

## 2021-08-31 DIAGNOSIS — Z881 Allergy status to other antibiotic agents status: Secondary | ICD-10-CM | POA: Diagnosis not present

## 2021-08-31 DIAGNOSIS — Z4801 Encounter for change or removal of surgical wound dressing: Secondary | ICD-10-CM | POA: Diagnosis not present

## 2021-08-31 DIAGNOSIS — K5900 Constipation, unspecified: Secondary | ICD-10-CM | POA: Diagnosis not present

## 2021-08-31 DIAGNOSIS — A419 Sepsis, unspecified organism: Secondary | ICD-10-CM | POA: Diagnosis not present

## 2021-08-31 DIAGNOSIS — Z980 Intestinal bypass and anastomosis status: Secondary | ICD-10-CM | POA: Diagnosis not present

## 2021-08-31 DIAGNOSIS — D84821 Immunodeficiency due to drugs: Secondary | ICD-10-CM | POA: Diagnosis not present

## 2021-08-31 DIAGNOSIS — M6281 Muscle weakness (generalized): Secondary | ICD-10-CM | POA: Diagnosis not present

## 2021-08-31 DIAGNOSIS — K208 Other esophagitis without bleeding: Secondary | ICD-10-CM | POA: Diagnosis not present

## 2021-08-31 DIAGNOSIS — K2100 Gastro-esophageal reflux disease with esophagitis, without bleeding: Secondary | ICD-10-CM | POA: Diagnosis not present

## 2021-08-31 DIAGNOSIS — R12 Heartburn: Secondary | ICD-10-CM | POA: Diagnosis not present

## 2021-08-31 DIAGNOSIS — K921 Melena: Secondary | ICD-10-CM | POA: Diagnosis not present

## 2021-08-31 DIAGNOSIS — R6520 Severe sepsis without septic shock: Secondary | ICD-10-CM | POA: Diagnosis not present

## 2021-08-31 DIAGNOSIS — Z20822 Contact with and (suspected) exposure to covid-19: Secondary | ICD-10-CM | POA: Diagnosis not present

## 2021-08-31 DIAGNOSIS — T8619 Other complication of kidney transplant: Secondary | ICD-10-CM | POA: Diagnosis not present

## 2021-08-31 DIAGNOSIS — Z792 Long term (current) use of antibiotics: Secondary | ICD-10-CM | POA: Diagnosis not present

## 2021-08-31 DIAGNOSIS — D62 Acute posthemorrhagic anemia: Secondary | ICD-10-CM | POA: Diagnosis not present

## 2021-08-31 DIAGNOSIS — Z79621 Long term (current) use of calcineurin inhibitor: Secondary | ICD-10-CM | POA: Diagnosis not present

## 2021-08-31 DIAGNOSIS — Z9181 History of falling: Secondary | ICD-10-CM | POA: Diagnosis not present

## 2021-08-31 DIAGNOSIS — Z940 Kidney transplant status: Secondary | ICD-10-CM | POA: Diagnosis not present

## 2021-08-31 DIAGNOSIS — E1122 Type 2 diabetes mellitus with diabetic chronic kidney disease: Secondary | ICD-10-CM | POA: Diagnosis not present

## 2021-08-31 DIAGNOSIS — K2210 Ulcer of esophagus without bleeding: Secondary | ICD-10-CM | POA: Diagnosis not present

## 2021-08-31 DIAGNOSIS — K209 Esophagitis, unspecified without bleeding: Secondary | ICD-10-CM | POA: Diagnosis not present

## 2021-08-31 DIAGNOSIS — K9420 Gastrostomy complication, unspecified: Secondary | ICD-10-CM | POA: Diagnosis not present

## 2021-08-31 DIAGNOSIS — T85520A Displacement of bile duct prosthesis, initial encounter: Secondary | ICD-10-CM | POA: Diagnosis not present

## 2021-08-31 DIAGNOSIS — Z7952 Long term (current) use of systemic steroids: Secondary | ICD-10-CM | POA: Diagnosis not present

## 2021-08-31 DIAGNOSIS — Z7984 Long term (current) use of oral hypoglycemic drugs: Secondary | ICD-10-CM | POA: Diagnosis not present

## 2021-08-31 DIAGNOSIS — I129 Hypertensive chronic kidney disease with stage 1 through stage 4 chronic kidney disease, or unspecified chronic kidney disease: Secondary | ICD-10-CM | POA: Diagnosis not present

## 2021-08-31 DIAGNOSIS — I2510 Atherosclerotic heart disease of native coronary artery without angina pectoris: Secondary | ICD-10-CM | POA: Diagnosis not present

## 2021-08-31 DIAGNOSIS — Z8711 Personal history of peptic ulcer disease: Secondary | ICD-10-CM | POA: Diagnosis not present

## 2021-08-31 DIAGNOSIS — E861 Hypovolemia: Secondary | ICD-10-CM | POA: Diagnosis not present

## 2021-08-31 DIAGNOSIS — K289 Gastrojejunal ulcer, unspecified as acute or chronic, without hemorrhage or perforation: Secondary | ICD-10-CM | POA: Diagnosis not present

## 2021-08-31 DIAGNOSIS — D72829 Elevated white blood cell count, unspecified: Secondary | ICD-10-CM | POA: Diagnosis not present

## 2021-08-31 DIAGNOSIS — N179 Acute kidney failure, unspecified: Secondary | ICD-10-CM | POA: Diagnosis not present

## 2021-08-31 DIAGNOSIS — K5710 Diverticulosis of small intestine without perforation or abscess without bleeding: Secondary | ICD-10-CM | POA: Diagnosis not present

## 2021-08-31 DIAGNOSIS — Z87891 Personal history of nicotine dependence: Secondary | ICD-10-CM | POA: Diagnosis not present

## 2021-08-31 DIAGNOSIS — Z4682 Encounter for fitting and adjustment of non-vascular catheter: Secondary | ICD-10-CM | POA: Diagnosis not present

## 2021-08-31 DIAGNOSIS — N189 Chronic kidney disease, unspecified: Secondary | ICD-10-CM | POA: Diagnosis not present

## 2021-08-31 DIAGNOSIS — E7800 Pure hypercholesterolemia, unspecified: Secondary | ICD-10-CM | POA: Diagnosis not present

## 2021-08-31 DIAGNOSIS — E871 Hypo-osmolality and hyponatremia: Secondary | ICD-10-CM | POA: Diagnosis not present

## 2021-08-31 DIAGNOSIS — Z9884 Bariatric surgery status: Secondary | ICD-10-CM | POA: Diagnosis not present

## 2021-08-31 DIAGNOSIS — I10 Essential (primary) hypertension: Secondary | ICD-10-CM | POA: Diagnosis not present

## 2021-08-31 DIAGNOSIS — K5700 Diverticulitis of small intestine with perforation and abscess without bleeding: Secondary | ICD-10-CM | POA: Diagnosis not present

## 2021-08-31 DIAGNOSIS — Y831 Surgical operation with implant of artificial internal device as the cause of abnormal reaction of the patient, or of later complication, without mention of misadventure at the time of the procedure: Secondary | ICD-10-CM | POA: Diagnosis not present

## 2021-08-31 DIAGNOSIS — K261 Acute duodenal ulcer with perforation: Secondary | ICD-10-CM | POA: Diagnosis not present

## 2021-08-31 DIAGNOSIS — Z794 Long term (current) use of insulin: Secondary | ICD-10-CM | POA: Diagnosis not present

## 2021-08-31 DIAGNOSIS — T182XXA Foreign body in stomach, initial encounter: Secondary | ICD-10-CM | POA: Diagnosis not present

## 2021-08-31 DIAGNOSIS — D849 Immunodeficiency, unspecified: Secondary | ICD-10-CM | POA: Diagnosis not present

## 2021-08-31 DIAGNOSIS — R2681 Unsteadiness on feet: Secondary | ICD-10-CM | POA: Diagnosis not present

## 2021-08-31 DIAGNOSIS — R079 Chest pain, unspecified: Secondary | ICD-10-CM | POA: Diagnosis not present

## 2021-08-31 DIAGNOSIS — Z79899 Other long term (current) drug therapy: Secondary | ICD-10-CM | POA: Diagnosis not present

## 2021-09-01 DIAGNOSIS — I10 Essential (primary) hypertension: Secondary | ICD-10-CM | POA: Diagnosis not present

## 2021-09-01 DIAGNOSIS — Z940 Kidney transplant status: Secondary | ICD-10-CM | POA: Diagnosis not present

## 2021-09-01 DIAGNOSIS — E1121 Type 2 diabetes mellitus with diabetic nephropathy: Secondary | ICD-10-CM | POA: Diagnosis not present

## 2021-09-01 DIAGNOSIS — K265 Chronic or unspecified duodenal ulcer with perforation: Secondary | ICD-10-CM | POA: Diagnosis not present

## 2021-09-01 DIAGNOSIS — D849 Immunodeficiency, unspecified: Secondary | ICD-10-CM | POA: Diagnosis not present

## 2021-09-01 DIAGNOSIS — R1084 Generalized abdominal pain: Secondary | ICD-10-CM | POA: Diagnosis not present

## 2021-09-01 DIAGNOSIS — D72829 Elevated white blood cell count, unspecified: Secondary | ICD-10-CM | POA: Diagnosis not present

## 2021-09-01 DIAGNOSIS — E871 Hypo-osmolality and hyponatremia: Secondary | ICD-10-CM | POA: Diagnosis not present

## 2021-09-02 DIAGNOSIS — D849 Immunodeficiency, unspecified: Secondary | ICD-10-CM | POA: Diagnosis not present

## 2021-09-02 DIAGNOSIS — K265 Chronic or unspecified duodenal ulcer with perforation: Secondary | ICD-10-CM | POA: Diagnosis not present

## 2021-09-02 DIAGNOSIS — Y831 Surgical operation with implant of artificial internal device as the cause of abnormal reaction of the patient, or of later complication, without mention of misadventure at the time of the procedure: Secondary | ICD-10-CM | POA: Diagnosis not present

## 2021-09-02 DIAGNOSIS — K5710 Diverticulosis of small intestine without perforation or abscess without bleeding: Secondary | ICD-10-CM | POA: Diagnosis not present

## 2021-09-02 DIAGNOSIS — T85520A Displacement of bile duct prosthesis, initial encounter: Secondary | ICD-10-CM | POA: Diagnosis not present

## 2021-09-02 DIAGNOSIS — Z792 Long term (current) use of antibiotics: Secondary | ICD-10-CM | POA: Diagnosis not present

## 2021-09-02 DIAGNOSIS — I10 Essential (primary) hypertension: Secondary | ICD-10-CM | POA: Diagnosis not present

## 2021-09-02 DIAGNOSIS — R1084 Generalized abdominal pain: Secondary | ICD-10-CM | POA: Diagnosis not present

## 2021-09-02 DIAGNOSIS — D72829 Elevated white blood cell count, unspecified: Secondary | ICD-10-CM | POA: Diagnosis not present

## 2021-09-02 DIAGNOSIS — Z940 Kidney transplant status: Secondary | ICD-10-CM | POA: Diagnosis not present

## 2021-09-03 DIAGNOSIS — D849 Immunodeficiency, unspecified: Secondary | ICD-10-CM | POA: Diagnosis not present

## 2021-09-03 DIAGNOSIS — Z792 Long term (current) use of antibiotics: Secondary | ICD-10-CM | POA: Diagnosis not present

## 2021-09-03 DIAGNOSIS — I10 Essential (primary) hypertension: Secondary | ICD-10-CM | POA: Diagnosis not present

## 2021-09-03 DIAGNOSIS — E871 Hypo-osmolality and hyponatremia: Secondary | ICD-10-CM | POA: Diagnosis not present

## 2021-09-03 DIAGNOSIS — Z940 Kidney transplant status: Secondary | ICD-10-CM | POA: Diagnosis not present

## 2021-09-04 DIAGNOSIS — Z940 Kidney transplant status: Secondary | ICD-10-CM | POA: Diagnosis not present

## 2021-09-04 DIAGNOSIS — I10 Essential (primary) hypertension: Secondary | ICD-10-CM | POA: Diagnosis not present

## 2021-09-04 DIAGNOSIS — E871 Hypo-osmolality and hyponatremia: Secondary | ICD-10-CM | POA: Diagnosis not present

## 2021-09-04 DIAGNOSIS — D849 Immunodeficiency, unspecified: Secondary | ICD-10-CM | POA: Diagnosis not present

## 2021-09-04 DIAGNOSIS — Z792 Long term (current) use of antibiotics: Secondary | ICD-10-CM | POA: Diagnosis not present

## 2021-09-05 DIAGNOSIS — R12 Heartburn: Secondary | ICD-10-CM | POA: Diagnosis not present

## 2021-09-05 DIAGNOSIS — K921 Melena: Secondary | ICD-10-CM | POA: Diagnosis not present

## 2021-09-05 DIAGNOSIS — Z980 Intestinal bypass and anastomosis status: Secondary | ICD-10-CM | POA: Diagnosis not present

## 2021-09-05 DIAGNOSIS — D62 Acute posthemorrhagic anemia: Secondary | ICD-10-CM | POA: Diagnosis not present

## 2021-09-05 DIAGNOSIS — K289 Gastrojejunal ulcer, unspecified as acute or chronic, without hemorrhage or perforation: Secondary | ICD-10-CM | POA: Diagnosis not present

## 2021-09-05 DIAGNOSIS — D849 Immunodeficiency, unspecified: Secondary | ICD-10-CM | POA: Diagnosis not present

## 2021-09-05 DIAGNOSIS — K208 Other esophagitis without bleeding: Secondary | ICD-10-CM | POA: Diagnosis not present

## 2021-09-05 DIAGNOSIS — T182XXA Foreign body in stomach, initial encounter: Secondary | ICD-10-CM | POA: Diagnosis not present

## 2021-09-05 DIAGNOSIS — K209 Esophagitis, unspecified without bleeding: Secondary | ICD-10-CM | POA: Diagnosis not present

## 2021-09-05 DIAGNOSIS — K9420 Gastrostomy complication, unspecified: Secondary | ICD-10-CM | POA: Diagnosis not present

## 2021-09-06 ENCOUNTER — Other Ambulatory Visit: Payer: Self-pay | Admitting: Physician Assistant

## 2021-09-06 DIAGNOSIS — Z4682 Encounter for fitting and adjustment of non-vascular catheter: Secondary | ICD-10-CM | POA: Diagnosis not present

## 2021-09-06 DIAGNOSIS — Z940 Kidney transplant status: Secondary | ICD-10-CM | POA: Diagnosis not present

## 2021-09-06 DIAGNOSIS — E871 Hypo-osmolality and hyponatremia: Secondary | ICD-10-CM | POA: Diagnosis not present

## 2021-09-06 DIAGNOSIS — D849 Immunodeficiency, unspecified: Secondary | ICD-10-CM | POA: Diagnosis not present

## 2021-09-06 DIAGNOSIS — E7800 Pure hypercholesterolemia, unspecified: Secondary | ICD-10-CM

## 2021-09-06 DIAGNOSIS — I10 Essential (primary) hypertension: Secondary | ICD-10-CM | POA: Diagnosis not present

## 2021-09-07 DIAGNOSIS — Z794 Long term (current) use of insulin: Secondary | ICD-10-CM | POA: Diagnosis not present

## 2021-09-07 DIAGNOSIS — Z940 Kidney transplant status: Secondary | ICD-10-CM | POA: Diagnosis not present

## 2021-09-07 DIAGNOSIS — R1084 Generalized abdominal pain: Secondary | ICD-10-CM | POA: Diagnosis not present

## 2021-09-07 DIAGNOSIS — E871 Hypo-osmolality and hyponatremia: Secondary | ICD-10-CM | POA: Diagnosis not present

## 2021-09-07 DIAGNOSIS — Z792 Long term (current) use of antibiotics: Secondary | ICD-10-CM | POA: Diagnosis not present

## 2021-09-07 DIAGNOSIS — D849 Immunodeficiency, unspecified: Secondary | ICD-10-CM | POA: Diagnosis not present

## 2021-09-07 DIAGNOSIS — I10 Essential (primary) hypertension: Secondary | ICD-10-CM | POA: Diagnosis not present

## 2021-09-07 DIAGNOSIS — R079 Chest pain, unspecified: Secondary | ICD-10-CM | POA: Diagnosis not present

## 2021-09-07 DIAGNOSIS — D72829 Elevated white blood cell count, unspecified: Secondary | ICD-10-CM | POA: Diagnosis not present

## 2021-09-07 DIAGNOSIS — K265 Chronic or unspecified duodenal ulcer with perforation: Secondary | ICD-10-CM | POA: Diagnosis not present

## 2021-09-07 DIAGNOSIS — E1121 Type 2 diabetes mellitus with diabetic nephropathy: Secondary | ICD-10-CM | POA: Diagnosis not present

## 2021-09-09 DIAGNOSIS — N189 Chronic kidney disease, unspecified: Secondary | ICD-10-CM | POA: Diagnosis not present

## 2021-09-09 DIAGNOSIS — E1122 Type 2 diabetes mellitus with diabetic chronic kidney disease: Secondary | ICD-10-CM | POA: Diagnosis not present

## 2021-09-09 DIAGNOSIS — Z9181 History of falling: Secondary | ICD-10-CM | POA: Diagnosis not present

## 2021-09-09 DIAGNOSIS — Z4801 Encounter for change or removal of surgical wound dressing: Secondary | ICD-10-CM | POA: Diagnosis not present

## 2021-09-09 DIAGNOSIS — Z7984 Long term (current) use of oral hypoglycemic drugs: Secondary | ICD-10-CM | POA: Diagnosis not present

## 2021-09-09 DIAGNOSIS — K261 Acute duodenal ulcer with perforation: Secondary | ICD-10-CM | POA: Diagnosis not present

## 2021-09-09 DIAGNOSIS — M6281 Muscle weakness (generalized): Secondary | ICD-10-CM | POA: Diagnosis not present

## 2021-09-09 DIAGNOSIS — I129 Hypertensive chronic kidney disease with stage 1 through stage 4 chronic kidney disease, or unspecified chronic kidney disease: Secondary | ICD-10-CM | POA: Diagnosis not present

## 2021-09-09 DIAGNOSIS — Z7952 Long term (current) use of systemic steroids: Secondary | ICD-10-CM | POA: Diagnosis not present

## 2021-09-09 DIAGNOSIS — Z79621 Long term (current) use of calcineurin inhibitor: Secondary | ICD-10-CM | POA: Diagnosis not present

## 2021-09-09 DIAGNOSIS — Z940 Kidney transplant status: Secondary | ICD-10-CM | POA: Diagnosis not present

## 2021-09-09 DIAGNOSIS — R2681 Unsteadiness on feet: Secondary | ICD-10-CM | POA: Diagnosis not present

## 2021-09-13 DIAGNOSIS — Z79621 Long term (current) use of calcineurin inhibitor: Secondary | ICD-10-CM | POA: Diagnosis not present

## 2021-09-13 DIAGNOSIS — N189 Chronic kidney disease, unspecified: Secondary | ICD-10-CM | POA: Diagnosis not present

## 2021-09-13 DIAGNOSIS — R2681 Unsteadiness on feet: Secondary | ICD-10-CM | POA: Diagnosis not present

## 2021-09-13 DIAGNOSIS — K261 Acute duodenal ulcer with perforation: Secondary | ICD-10-CM | POA: Diagnosis not present

## 2021-09-13 DIAGNOSIS — Z9181 History of falling: Secondary | ICD-10-CM | POA: Diagnosis not present

## 2021-09-13 DIAGNOSIS — Z4801 Encounter for change or removal of surgical wound dressing: Secondary | ICD-10-CM | POA: Diagnosis not present

## 2021-09-13 DIAGNOSIS — Z940 Kidney transplant status: Secondary | ICD-10-CM | POA: Diagnosis not present

## 2021-09-13 DIAGNOSIS — Z7952 Long term (current) use of systemic steroids: Secondary | ICD-10-CM | POA: Diagnosis not present

## 2021-09-13 DIAGNOSIS — Z7984 Long term (current) use of oral hypoglycemic drugs: Secondary | ICD-10-CM | POA: Diagnosis not present

## 2021-09-13 DIAGNOSIS — E1122 Type 2 diabetes mellitus with diabetic chronic kidney disease: Secondary | ICD-10-CM | POA: Diagnosis not present

## 2021-09-13 DIAGNOSIS — I129 Hypertensive chronic kidney disease with stage 1 through stage 4 chronic kidney disease, or unspecified chronic kidney disease: Secondary | ICD-10-CM | POA: Diagnosis not present

## 2021-09-13 DIAGNOSIS — M6281 Muscle weakness (generalized): Secondary | ICD-10-CM | POA: Diagnosis not present

## 2021-09-14 DIAGNOSIS — K265 Chronic or unspecified duodenal ulcer with perforation: Secondary | ICD-10-CM | POA: Diagnosis not present

## 2021-09-14 DIAGNOSIS — Z940 Kidney transplant status: Secondary | ICD-10-CM | POA: Diagnosis not present

## 2021-09-14 DIAGNOSIS — D72829 Elevated white blood cell count, unspecified: Secondary | ICD-10-CM | POA: Diagnosis not present

## 2021-09-14 DIAGNOSIS — R739 Hyperglycemia, unspecified: Secondary | ICD-10-CM | POA: Diagnosis not present

## 2021-09-14 DIAGNOSIS — D849 Immunodeficiency, unspecified: Secondary | ICD-10-CM | POA: Diagnosis not present

## 2021-09-14 DIAGNOSIS — T380X5A Adverse effect of glucocorticoids and synthetic analogues, initial encounter: Secondary | ICD-10-CM | POA: Diagnosis not present

## 2021-09-14 DIAGNOSIS — I10 Essential (primary) hypertension: Secondary | ICD-10-CM | POA: Diagnosis not present

## 2021-09-14 DIAGNOSIS — Z792 Long term (current) use of antibiotics: Secondary | ICD-10-CM | POA: Diagnosis not present

## 2021-09-15 DIAGNOSIS — K261 Acute duodenal ulcer with perforation: Secondary | ICD-10-CM | POA: Diagnosis not present

## 2021-09-15 DIAGNOSIS — Z4801 Encounter for change or removal of surgical wound dressing: Secondary | ICD-10-CM | POA: Diagnosis not present

## 2021-09-15 DIAGNOSIS — Z940 Kidney transplant status: Secondary | ICD-10-CM | POA: Diagnosis not present

## 2021-09-15 DIAGNOSIS — Z7984 Long term (current) use of oral hypoglycemic drugs: Secondary | ICD-10-CM | POA: Diagnosis not present

## 2021-09-15 DIAGNOSIS — Z79621 Long term (current) use of calcineurin inhibitor: Secondary | ICD-10-CM | POA: Diagnosis not present

## 2021-09-15 DIAGNOSIS — R2681 Unsteadiness on feet: Secondary | ICD-10-CM | POA: Diagnosis not present

## 2021-09-15 DIAGNOSIS — E1122 Type 2 diabetes mellitus with diabetic chronic kidney disease: Secondary | ICD-10-CM | POA: Diagnosis not present

## 2021-09-15 DIAGNOSIS — N189 Chronic kidney disease, unspecified: Secondary | ICD-10-CM | POA: Diagnosis not present

## 2021-09-15 DIAGNOSIS — Z7952 Long term (current) use of systemic steroids: Secondary | ICD-10-CM | POA: Diagnosis not present

## 2021-09-15 DIAGNOSIS — I129 Hypertensive chronic kidney disease with stage 1 through stage 4 chronic kidney disease, or unspecified chronic kidney disease: Secondary | ICD-10-CM | POA: Diagnosis not present

## 2021-09-15 DIAGNOSIS — M6281 Muscle weakness (generalized): Secondary | ICD-10-CM | POA: Diagnosis not present

## 2021-09-15 DIAGNOSIS — Z9181 History of falling: Secondary | ICD-10-CM | POA: Diagnosis not present

## 2021-09-20 DIAGNOSIS — Z4801 Encounter for change or removal of surgical wound dressing: Secondary | ICD-10-CM | POA: Diagnosis not present

## 2021-09-20 DIAGNOSIS — K261 Acute duodenal ulcer with perforation: Secondary | ICD-10-CM | POA: Diagnosis not present

## 2021-09-20 DIAGNOSIS — Z7984 Long term (current) use of oral hypoglycemic drugs: Secondary | ICD-10-CM | POA: Diagnosis not present

## 2021-09-20 DIAGNOSIS — M6281 Muscle weakness (generalized): Secondary | ICD-10-CM | POA: Diagnosis not present

## 2021-09-20 DIAGNOSIS — Z7952 Long term (current) use of systemic steroids: Secondary | ICD-10-CM | POA: Diagnosis not present

## 2021-09-20 DIAGNOSIS — Z9181 History of falling: Secondary | ICD-10-CM | POA: Diagnosis not present

## 2021-09-20 DIAGNOSIS — R2681 Unsteadiness on feet: Secondary | ICD-10-CM | POA: Diagnosis not present

## 2021-09-20 DIAGNOSIS — E1122 Type 2 diabetes mellitus with diabetic chronic kidney disease: Secondary | ICD-10-CM | POA: Diagnosis not present

## 2021-09-20 DIAGNOSIS — Z940 Kidney transplant status: Secondary | ICD-10-CM | POA: Diagnosis not present

## 2021-09-20 DIAGNOSIS — N189 Chronic kidney disease, unspecified: Secondary | ICD-10-CM | POA: Diagnosis not present

## 2021-09-20 DIAGNOSIS — I129 Hypertensive chronic kidney disease with stage 1 through stage 4 chronic kidney disease, or unspecified chronic kidney disease: Secondary | ICD-10-CM | POA: Diagnosis not present

## 2021-09-20 DIAGNOSIS — Z79621 Long term (current) use of calcineurin inhibitor: Secondary | ICD-10-CM | POA: Diagnosis not present

## 2021-09-21 DIAGNOSIS — I10 Essential (primary) hypertension: Secondary | ICD-10-CM | POA: Diagnosis not present

## 2021-09-21 DIAGNOSIS — R1010 Upper abdominal pain, unspecified: Secondary | ICD-10-CM | POA: Diagnosis not present

## 2021-09-21 DIAGNOSIS — Z940 Kidney transplant status: Secondary | ICD-10-CM | POA: Diagnosis not present

## 2021-09-21 DIAGNOSIS — Z298 Encounter for other specified prophylactic measures: Secondary | ICD-10-CM | POA: Diagnosis not present

## 2021-09-21 DIAGNOSIS — Z79899 Other long term (current) drug therapy: Secondary | ICD-10-CM | POA: Diagnosis not present

## 2021-09-22 DIAGNOSIS — M6281 Muscle weakness (generalized): Secondary | ICD-10-CM | POA: Diagnosis not present

## 2021-09-22 DIAGNOSIS — Z79621 Long term (current) use of calcineurin inhibitor: Secondary | ICD-10-CM | POA: Diagnosis not present

## 2021-09-22 DIAGNOSIS — Z4801 Encounter for change or removal of surgical wound dressing: Secondary | ICD-10-CM | POA: Diagnosis not present

## 2021-09-22 DIAGNOSIS — N189 Chronic kidney disease, unspecified: Secondary | ICD-10-CM | POA: Diagnosis not present

## 2021-09-22 DIAGNOSIS — Z7984 Long term (current) use of oral hypoglycemic drugs: Secondary | ICD-10-CM | POA: Diagnosis not present

## 2021-09-22 DIAGNOSIS — R2681 Unsteadiness on feet: Secondary | ICD-10-CM | POA: Diagnosis not present

## 2021-09-22 DIAGNOSIS — Z7952 Long term (current) use of systemic steroids: Secondary | ICD-10-CM | POA: Diagnosis not present

## 2021-09-22 DIAGNOSIS — K261 Acute duodenal ulcer with perforation: Secondary | ICD-10-CM | POA: Diagnosis not present

## 2021-09-22 DIAGNOSIS — Z940 Kidney transplant status: Secondary | ICD-10-CM | POA: Diagnosis not present

## 2021-09-22 DIAGNOSIS — E1122 Type 2 diabetes mellitus with diabetic chronic kidney disease: Secondary | ICD-10-CM | POA: Diagnosis not present

## 2021-09-22 DIAGNOSIS — I129 Hypertensive chronic kidney disease with stage 1 through stage 4 chronic kidney disease, or unspecified chronic kidney disease: Secondary | ICD-10-CM | POA: Diagnosis not present

## 2021-09-22 DIAGNOSIS — Z9181 History of falling: Secondary | ICD-10-CM | POA: Diagnosis not present

## 2021-09-26 DIAGNOSIS — Z9181 History of falling: Secondary | ICD-10-CM | POA: Diagnosis not present

## 2021-09-26 DIAGNOSIS — Z940 Kidney transplant status: Secondary | ICD-10-CM | POA: Diagnosis not present

## 2021-09-26 DIAGNOSIS — Z79621 Long term (current) use of calcineurin inhibitor: Secondary | ICD-10-CM | POA: Diagnosis not present

## 2021-09-26 DIAGNOSIS — E1122 Type 2 diabetes mellitus with diabetic chronic kidney disease: Secondary | ICD-10-CM | POA: Diagnosis not present

## 2021-09-26 DIAGNOSIS — K261 Acute duodenal ulcer with perforation: Secondary | ICD-10-CM | POA: Diagnosis not present

## 2021-09-26 DIAGNOSIS — M6281 Muscle weakness (generalized): Secondary | ICD-10-CM | POA: Diagnosis not present

## 2021-09-26 DIAGNOSIS — Z7952 Long term (current) use of systemic steroids: Secondary | ICD-10-CM | POA: Diagnosis not present

## 2021-09-26 DIAGNOSIS — I129 Hypertensive chronic kidney disease with stage 1 through stage 4 chronic kidney disease, or unspecified chronic kidney disease: Secondary | ICD-10-CM | POA: Diagnosis not present

## 2021-09-26 DIAGNOSIS — N189 Chronic kidney disease, unspecified: Secondary | ICD-10-CM | POA: Diagnosis not present

## 2021-09-26 DIAGNOSIS — Z4801 Encounter for change or removal of surgical wound dressing: Secondary | ICD-10-CM | POA: Diagnosis not present

## 2021-09-26 DIAGNOSIS — R2681 Unsteadiness on feet: Secondary | ICD-10-CM | POA: Diagnosis not present

## 2021-09-26 DIAGNOSIS — Z7984 Long term (current) use of oral hypoglycemic drugs: Secondary | ICD-10-CM | POA: Diagnosis not present

## 2021-09-27 DIAGNOSIS — Z940 Kidney transplant status: Secondary | ICD-10-CM | POA: Diagnosis not present

## 2021-09-27 DIAGNOSIS — I10 Essential (primary) hypertension: Secondary | ICD-10-CM | POA: Diagnosis not present

## 2021-09-27 DIAGNOSIS — Z298 Encounter for other specified prophylactic measures: Secondary | ICD-10-CM | POA: Diagnosis not present

## 2021-09-27 DIAGNOSIS — Z79899 Other long term (current) drug therapy: Secondary | ICD-10-CM | POA: Diagnosis not present

## 2021-09-27 DIAGNOSIS — E1121 Type 2 diabetes mellitus with diabetic nephropathy: Secondary | ICD-10-CM | POA: Diagnosis not present

## 2021-09-27 DIAGNOSIS — R7989 Other specified abnormal findings of blood chemistry: Secondary | ICD-10-CM | POA: Diagnosis not present

## 2021-09-30 ENCOUNTER — Other Ambulatory Visit: Payer: Self-pay | Admitting: Family Medicine

## 2021-09-30 DIAGNOSIS — N189 Chronic kidney disease, unspecified: Secondary | ICD-10-CM | POA: Diagnosis not present

## 2021-09-30 DIAGNOSIS — M6281 Muscle weakness (generalized): Secondary | ICD-10-CM | POA: Diagnosis not present

## 2021-09-30 DIAGNOSIS — Z7952 Long term (current) use of systemic steroids: Secondary | ICD-10-CM | POA: Diagnosis not present

## 2021-09-30 DIAGNOSIS — R2681 Unsteadiness on feet: Secondary | ICD-10-CM | POA: Diagnosis not present

## 2021-09-30 DIAGNOSIS — I129 Hypertensive chronic kidney disease with stage 1 through stage 4 chronic kidney disease, or unspecified chronic kidney disease: Secondary | ICD-10-CM | POA: Diagnosis not present

## 2021-09-30 DIAGNOSIS — Z7984 Long term (current) use of oral hypoglycemic drugs: Secondary | ICD-10-CM | POA: Diagnosis not present

## 2021-09-30 DIAGNOSIS — Z9181 History of falling: Secondary | ICD-10-CM | POA: Diagnosis not present

## 2021-09-30 DIAGNOSIS — Z79621 Long term (current) use of calcineurin inhibitor: Secondary | ICD-10-CM | POA: Diagnosis not present

## 2021-09-30 DIAGNOSIS — E1122 Type 2 diabetes mellitus with diabetic chronic kidney disease: Secondary | ICD-10-CM | POA: Diagnosis not present

## 2021-09-30 DIAGNOSIS — K261 Acute duodenal ulcer with perforation: Secondary | ICD-10-CM | POA: Diagnosis not present

## 2021-09-30 DIAGNOSIS — Z940 Kidney transplant status: Secondary | ICD-10-CM | POA: Diagnosis not present

## 2021-09-30 DIAGNOSIS — Z4801 Encounter for change or removal of surgical wound dressing: Secondary | ICD-10-CM | POA: Diagnosis not present

## 2021-10-05 DIAGNOSIS — N189 Chronic kidney disease, unspecified: Secondary | ICD-10-CM | POA: Diagnosis not present

## 2021-10-05 DIAGNOSIS — K261 Acute duodenal ulcer with perforation: Secondary | ICD-10-CM | POA: Diagnosis not present

## 2021-10-05 DIAGNOSIS — Z940 Kidney transplant status: Secondary | ICD-10-CM | POA: Diagnosis not present

## 2021-10-05 DIAGNOSIS — Z9181 History of falling: Secondary | ICD-10-CM | POA: Diagnosis not present

## 2021-10-05 DIAGNOSIS — Z7952 Long term (current) use of systemic steroids: Secondary | ICD-10-CM | POA: Diagnosis not present

## 2021-10-05 DIAGNOSIS — Z4801 Encounter for change or removal of surgical wound dressing: Secondary | ICD-10-CM | POA: Diagnosis not present

## 2021-10-05 DIAGNOSIS — R2681 Unsteadiness on feet: Secondary | ICD-10-CM | POA: Diagnosis not present

## 2021-10-05 DIAGNOSIS — I129 Hypertensive chronic kidney disease with stage 1 through stage 4 chronic kidney disease, or unspecified chronic kidney disease: Secondary | ICD-10-CM | POA: Diagnosis not present

## 2021-10-05 DIAGNOSIS — M6281 Muscle weakness (generalized): Secondary | ICD-10-CM | POA: Diagnosis not present

## 2021-10-05 DIAGNOSIS — E1122 Type 2 diabetes mellitus with diabetic chronic kidney disease: Secondary | ICD-10-CM | POA: Diagnosis not present

## 2021-10-05 DIAGNOSIS — Z79621 Long term (current) use of calcineurin inhibitor: Secondary | ICD-10-CM | POA: Diagnosis not present

## 2021-10-05 DIAGNOSIS — Z7984 Long term (current) use of oral hypoglycemic drugs: Secondary | ICD-10-CM | POA: Diagnosis not present

## 2021-10-08 DIAGNOSIS — Z4801 Encounter for change or removal of surgical wound dressing: Secondary | ICD-10-CM | POA: Diagnosis not present

## 2021-10-08 DIAGNOSIS — R2681 Unsteadiness on feet: Secondary | ICD-10-CM | POA: Diagnosis not present

## 2021-10-08 DIAGNOSIS — E1122 Type 2 diabetes mellitus with diabetic chronic kidney disease: Secondary | ICD-10-CM | POA: Diagnosis not present

## 2021-10-08 DIAGNOSIS — Z940 Kidney transplant status: Secondary | ICD-10-CM | POA: Diagnosis not present

## 2021-10-08 DIAGNOSIS — Z7952 Long term (current) use of systemic steroids: Secondary | ICD-10-CM | POA: Diagnosis not present

## 2021-10-08 DIAGNOSIS — M6281 Muscle weakness (generalized): Secondary | ICD-10-CM | POA: Diagnosis not present

## 2021-10-08 DIAGNOSIS — N189 Chronic kidney disease, unspecified: Secondary | ICD-10-CM | POA: Diagnosis not present

## 2021-10-08 DIAGNOSIS — I129 Hypertensive chronic kidney disease with stage 1 through stage 4 chronic kidney disease, or unspecified chronic kidney disease: Secondary | ICD-10-CM | POA: Diagnosis not present

## 2021-10-08 DIAGNOSIS — Z79621 Long term (current) use of calcineurin inhibitor: Secondary | ICD-10-CM | POA: Diagnosis not present

## 2021-10-08 DIAGNOSIS — K261 Acute duodenal ulcer with perforation: Secondary | ICD-10-CM | POA: Diagnosis not present

## 2021-10-08 DIAGNOSIS — Z7984 Long term (current) use of oral hypoglycemic drugs: Secondary | ICD-10-CM | POA: Diagnosis not present

## 2021-10-08 DIAGNOSIS — Z9181 History of falling: Secondary | ICD-10-CM | POA: Diagnosis not present

## 2021-10-11 DIAGNOSIS — Z298 Encounter for other specified prophylactic measures: Secondary | ICD-10-CM | POA: Diagnosis not present

## 2021-10-11 DIAGNOSIS — Z940 Kidney transplant status: Secondary | ICD-10-CM | POA: Diagnosis not present

## 2021-10-11 DIAGNOSIS — K209 Esophagitis, unspecified without bleeding: Secondary | ICD-10-CM | POA: Diagnosis not present

## 2021-10-11 DIAGNOSIS — D849 Immunodeficiency, unspecified: Secondary | ICD-10-CM | POA: Diagnosis not present

## 2021-10-12 DIAGNOSIS — D84821 Immunodeficiency due to drugs: Secondary | ICD-10-CM | POA: Diagnosis not present

## 2021-10-12 DIAGNOSIS — K265 Chronic or unspecified duodenal ulcer with perforation: Secondary | ICD-10-CM | POA: Diagnosis not present

## 2021-10-12 DIAGNOSIS — Z940 Kidney transplant status: Secondary | ICD-10-CM | POA: Diagnosis not present

## 2021-10-12 DIAGNOSIS — K838 Other specified diseases of biliary tract: Secondary | ICD-10-CM | POA: Diagnosis not present

## 2021-10-12 DIAGNOSIS — K9189 Other postprocedural complications and disorders of digestive system: Secondary | ICD-10-CM | POA: Diagnosis not present

## 2021-10-12 DIAGNOSIS — Z4822 Encounter for aftercare following kidney transplant: Secondary | ICD-10-CM | POA: Diagnosis not present

## 2021-10-12 DIAGNOSIS — Z4659 Encounter for fitting and adjustment of other gastrointestinal appliance and device: Secondary | ICD-10-CM | POA: Diagnosis not present

## 2021-10-14 DIAGNOSIS — Z9181 History of falling: Secondary | ICD-10-CM | POA: Diagnosis not present

## 2021-10-14 DIAGNOSIS — N189 Chronic kidney disease, unspecified: Secondary | ICD-10-CM | POA: Diagnosis not present

## 2021-10-14 DIAGNOSIS — Z4801 Encounter for change or removal of surgical wound dressing: Secondary | ICD-10-CM | POA: Diagnosis not present

## 2021-10-14 DIAGNOSIS — E1122 Type 2 diabetes mellitus with diabetic chronic kidney disease: Secondary | ICD-10-CM | POA: Diagnosis not present

## 2021-10-14 DIAGNOSIS — Z940 Kidney transplant status: Secondary | ICD-10-CM | POA: Diagnosis not present

## 2021-10-14 DIAGNOSIS — I129 Hypertensive chronic kidney disease with stage 1 through stage 4 chronic kidney disease, or unspecified chronic kidney disease: Secondary | ICD-10-CM | POA: Diagnosis not present

## 2021-10-14 DIAGNOSIS — Z7984 Long term (current) use of oral hypoglycemic drugs: Secondary | ICD-10-CM | POA: Diagnosis not present

## 2021-10-14 DIAGNOSIS — Z7952 Long term (current) use of systemic steroids: Secondary | ICD-10-CM | POA: Diagnosis not present

## 2021-10-14 DIAGNOSIS — Z79621 Long term (current) use of calcineurin inhibitor: Secondary | ICD-10-CM | POA: Diagnosis not present

## 2021-10-14 DIAGNOSIS — K261 Acute duodenal ulcer with perforation: Secondary | ICD-10-CM | POA: Diagnosis not present

## 2021-10-18 ENCOUNTER — Other Ambulatory Visit (INDEPENDENT_AMBULATORY_CARE_PROVIDER_SITE_OTHER): Payer: Medicare Other | Admitting: Family Medicine

## 2021-10-18 DIAGNOSIS — Z940 Kidney transplant status: Secondary | ICD-10-CM

## 2021-10-18 DIAGNOSIS — K261 Acute duodenal ulcer with perforation: Secondary | ICD-10-CM

## 2021-10-18 DIAGNOSIS — E1122 Type 2 diabetes mellitus with diabetic chronic kidney disease: Secondary | ICD-10-CM

## 2021-10-18 DIAGNOSIS — Z794 Long term (current) use of insulin: Secondary | ICD-10-CM

## 2021-10-18 DIAGNOSIS — Z7952 Long term (current) use of systemic steroids: Secondary | ICD-10-CM

## 2021-10-18 DIAGNOSIS — E119 Type 2 diabetes mellitus without complications: Secondary | ICD-10-CM

## 2021-10-18 DIAGNOSIS — Z9181 History of falling: Secondary | ICD-10-CM

## 2021-10-18 DIAGNOSIS — Z4801 Encounter for change or removal of surgical wound dressing: Secondary | ICD-10-CM

## 2021-10-18 DIAGNOSIS — I129 Hypertensive chronic kidney disease with stage 1 through stage 4 chronic kidney disease, or unspecified chronic kidney disease: Secondary | ICD-10-CM | POA: Diagnosis not present

## 2021-10-18 DIAGNOSIS — N189 Chronic kidney disease, unspecified: Secondary | ICD-10-CM

## 2021-10-18 DIAGNOSIS — Z79621 Long term (current) use of calcineurin inhibitor: Secondary | ICD-10-CM

## 2021-10-18 DIAGNOSIS — K315 Obstruction of duodenum: Secondary | ICD-10-CM | POA: Diagnosis not present

## 2021-10-18 DIAGNOSIS — Z7984 Long term (current) use of oral hypoglycemic drugs: Secondary | ICD-10-CM

## 2021-10-18 NOTE — Progress Notes (Signed)
Received home health orders orders from [facility]. ?Start of care 08/11/21.   Certification and orders from 10/10/21 through 12/08/21 are reviewed, signed and faxed back to home health company. ? ?Need of intermittent skilled services at home: S/p kidney transplant, unsafe to leave home alone, recent surgery ? ?The home health care plan has been established by me and will be reviewed and updated as needed to maximize patient recovery.  I certify that all home health services have been and will be furnished to the patient while under my care. ? ?Face-to-face encounter in which the need for home health services was established: after hospitalization in Jan and March 2023 after time in SNF ? ?Patient is receiving home health services for the following diagnoses: ?Problem List Items Addressed This Visit   ?None ?Visit Diagnoses   ? ? Encounter for change or removal of surgical wound dressing    -  Primary  ? Acute duodenal ulcer with perforation and obstruction ([place])      ? Hypomagnesemia      ? Type 2 diabetes mellitus with diabetic chronic kidney disease, unspecified CKD stage, unspecified whether long term insulin use ([place])      ? Hypertensive nephropathy      ? Chronic kidney disease, unspecified CKD stage      ? Kidney transplant status      ? History of falling      ? Diabetes mellitus treated with insulin and oral medication ([place])      ? Long term current use of systemic steroids      ? Long term (current) use of calcineurin inhibitor      ? ?  ? ? ? ?Lavon Paganini, MD   ?

## 2021-11-09 DIAGNOSIS — Z940 Kidney transplant status: Secondary | ICD-10-CM | POA: Diagnosis not present

## 2021-11-09 DIAGNOSIS — D849 Immunodeficiency, unspecified: Secondary | ICD-10-CM | POA: Diagnosis not present

## 2021-12-01 DIAGNOSIS — Z8711 Personal history of peptic ulcer disease: Secondary | ICD-10-CM | POA: Diagnosis not present

## 2021-12-01 DIAGNOSIS — E1121 Type 2 diabetes mellitus with diabetic nephropathy: Secondary | ICD-10-CM | POA: Diagnosis not present

## 2021-12-01 DIAGNOSIS — Z940 Kidney transplant status: Secondary | ICD-10-CM | POA: Diagnosis not present

## 2021-12-01 DIAGNOSIS — Z7952 Long term (current) use of systemic steroids: Secondary | ICD-10-CM | POA: Diagnosis not present

## 2021-12-01 DIAGNOSIS — D849 Immunodeficiency, unspecified: Secondary | ICD-10-CM | POA: Diagnosis not present

## 2021-12-01 DIAGNOSIS — Z79621 Long term (current) use of calcineurin inhibitor: Secondary | ICD-10-CM | POA: Diagnosis not present

## 2021-12-01 DIAGNOSIS — Z4822 Encounter for aftercare following kidney transplant: Secondary | ICD-10-CM | POA: Diagnosis not present

## 2021-12-01 DIAGNOSIS — Z794 Long term (current) use of insulin: Secondary | ICD-10-CM | POA: Diagnosis not present

## 2021-12-01 DIAGNOSIS — D84821 Immunodeficiency due to drugs: Secondary | ICD-10-CM | POA: Diagnosis not present

## 2021-12-01 DIAGNOSIS — I10 Essential (primary) hypertension: Secondary | ICD-10-CM | POA: Diagnosis not present

## 2021-12-01 DIAGNOSIS — Z298 Encounter for other specified prophylactic measures: Secondary | ICD-10-CM | POA: Diagnosis not present

## 2021-12-06 NOTE — Progress Notes (Signed)
I,Sulibeya S Dimas,acting as a Education administrator for Lavon Paganini, MD.,have documented all relevant documentation on the behalf of Lavon Paganini, MD,as directed by  Lavon Paganini, MD while in the presence of Lavon Paganini, MD.   Wellness physical exam   Patient: Debra Paul   DOB: [birth year]   71 y.o. Female  MRN: [id number] Visit Date: 12/08/2021  Today's healthcare provider: Lavon Paganini, MD   Chief Complaint  Patient presents with   Annual Exam   Subjective    Debra Paul is a 71 y.o. female who presents today for a complete physical exam.  She reports consuming a general diabetic diet. She reports just starting back on playing golf. She generally feels well. She reports sleeping well. She does have additional problems to discuss today.  HPI    Past Medical History:  Diagnosis Date   Anemia    Diabetes mellitus without complication (HCC)    GERD (gastroesophageal reflux disease)    Guaiac positive stools    History of renal calculi    Hypercholesteremia    Hypertension    Kidney stones    Takes lisinopril for kidney protection   Kidney stones    Parathyroid disorder ([place])    Sleep apnea    does not use CPAP   Vertigo    in past   Past Surgical History:  Procedure Laterality Date   CATARACT EXTRACTION W/PHACO Left 05/14/2017   Procedure: CATARACT EXTRACTION PHACO AND INTRAOCULAR LENS PLACEMENT ([place]) LEFT DIABETIC;  Surgeon: Leandrew Koyanagi, MD;  Location: [place];  Service: Ophthalmology;  Laterality: Left;  diabetic   COLONOSCOPY     2003,2007,2015,2017   COLONOSCOPY WITH PROPOFOL N/A 12/10/2020   Procedure: COLONOSCOPY WITH PROPOFOL;  Surgeon: [name], MD;  Location: [facility];  Service: Endoscopy;  Laterality: N/A;  DM   CYSTOSCOPY WITH URETEROSCOPY AND STENT PLACEMENT  09/29/2015   Dr. Bernardo Heater   DIALYSIS/PERMA CATHETER INSERTION Left 06/21/2018   Procedure: DIALYSIS/PERMA CATHETER INSERTION;   Surgeon: Katha Cabal, MD;  Location: [place] [facility];  Service: Cardiovascular;  Laterality: Left;   DIALYSIS/PERMA CATHETER REMOVAL N/A 07/18/2018   Procedure: DIALYSIS/PERMA CATHETER REMOVAL;  Surgeon: Algernon Huxley, MD;  Location: [place] [facility];  Service: Cardiovascular;  Laterality: N/A;   ESOPHAGOGASTRODUODENOSCOPY ENDOSCOPY     LITHOTRIPSY  2002   PARATHYROIDECTOMY  1990   Social History   Socioeconomic History   Marital status: Single    Spouse name: Not on file   Number of children: 1   Years of education: Not on file   Highest education level: Some college, no degree  Occupational History   Occupation: retired  Tobacco Use   Smoking status: Never   Smokeless tobacco: Never  Vaping Use   Vaping Use: Never used  Substance and Sexual Activity   Alcohol use: No    Comment: 2-3 times a year   Drug use: No   Sexual activity: Not Currently  Other Topics Concern   Not on file  Social History Narrative   Was legally separated at time of husbands death.   Social Determinants of Health   Financial Resource Strain: Low Risk  (05/16/2021)   Overall Financial Resource Strain (CARDIA)    Difficulty of Paying Living Expenses: Not hard at all  Food Insecurity: No Food Insecurity (05/16/2021)   Hunger Vital Sign    Worried About Running Out of Food in the Last Year: Never true    Ran Out of  Food in the Last Year: Never true  Transportation Needs: No Transportation Needs (05/16/2021)   PRAPARE - Hydrologist (Medical): No    Lack of Transportation (Non-Medical): No  Physical Activity: Insufficiently Active (05/16/2021)   Exercise Vital Sign    Days of Exercise per Week: 3 days    Minutes of Exercise per Session: 20 min  Stress: No Stress Concern Present (05/16/2021)   [place]    Feeling of Stress : Not at all  Social Connections: Moderately Isolated (05/16/2021)    Social Connection and Isolation Panel [NHANES]    Frequency of Communication with Friends and Family: More than three times a week    Frequency of Social Gatherings with Friends and Family: Once a week    Attends Religious Services: More than 4 times per year    Active Member of [employer] or Organizations: No    Attends Archivist Meetings: Never    Marital Status: Widowed  Intimate Partner Violence: Not At Risk (05/16/2021)   Humiliation, Afraid, Rape, and Kick questionnaire    Fear of Current or Ex-Partner: No    Emotionally Abused: No    Physically Abused: No    Sexually Abused: No   Family Status  Relation Name Status   Mother  Alive   Father  Deceased at age 73s   Sister  31   Brother  [place]   Son  Hana   Neg Hx  (Not Specified)   Family History  Problem Relation Age of Onset   Heart disease Mother    Atrial fibrillation Mother    Colon cancer Father    Coronary artery disease Maternal Grandmother    Breast cancer Neg Hx    Allergies  Allergen Reactions   Prilosec [Omeprazole] Other (See Comments)    Reaction: unknown   Ciprofloxacin Rash    Patient Care Team: Mikey Kirschner, PA-C as PCP - General (Physician Assistant) Murlean Iba, MD (Nephrology) Abbie Sons, MD (Urology) Bryson Ha, OD (Optometry) Fabio Bering, DMD as Referring Physician (Dentistry)   Medications: Outpatient Medications Prior to Visit  Medication Sig Note   acetaminophen (TYLENOL) 325 MG tablet Take 2 tablets (650 mg total) by mouth every 6 (six) hours as needed for mild pain (or Fever >/= 101).    Biotin 1000 MCG tablet Take 1,000 mcg by mouth daily.    Calcium Carbonate Antacid (TUMS PO) 750 mg in the morning, at noon, and at bedtime. 12/08/2021: Med Classification: Gastrointestinal Therapy Agents   Cholecalciferol (VITAMIN D3) 50 MCG (2000 UT) capsule Take 1,000 Units by mouth daily.    empagliflozin (JARDIANCE) 10 MG TABS  tablet Take 1 tablet by mouth daily. 12/08/2021: Med Classification: Endocrine   ezetimibe (ZETIA) 10 MG tablet Take 1 tablet by mouth as directed.    famotidine (PEPCID) 20 MG tablet Take by mouth.    glipiZIDE (GLUCOTROL) 5 MG tablet in the morning and at bedtime. 12/08/2021: Med Classification: Endocrine   Multiple Vitamin (MULTI-VITAMIN) tablet Take 1 tablet by mouth daily. CENTRUM SILVER    mycophenolate (CELLCEPT) 250 MG capsule in the morning, at noon, in the evening, and at bedtime. 12/08/2021: Med Classification: Immunosuppressive Agents   OVER THE COUNTER MEDICATION     PANTOPRAZOLE SODIUM PO Take 40 mg by mouth daily in the afternoon. 12/08/2021: Med Classification: Gastrointestinal Therapy Agents   pravastatin (  PRAVACHOL) 20 MG tablet TAKE 1 TABLET BY MOUTH AT  BEDTIME    predniSONE (DELTASONE) 5 MG tablet Take by mouth.    sodium bicarbonate 650 MG tablet Take 1,300 mg by mouth 2 (two) times daily.     Sulfamethoxazole-Trimethoprim (BACTRIM DS PO) One tablet Mon. Wed., and Friday 12/08/2021: Med Classification: Anti-Infective Agents   tacrolimus (PROGRAF) 1 MG capsule in the morning, at noon, and at bedtime. 12/08/2021: Med Classification: Immunosuppressive Agents   [DISCONTINUED] amLODipine (NORVASC) 2.5 MG tablet TAKE 1 TABLET BY MOUTH AT  BEDTIME    [DISCONTINUED] ferrous sulfate 325 (65 FE) MG tablet Take 325 mg by mouth daily.     [DISCONTINUED] losartan (COZAAR) 25 MG tablet Take 25 mg by mouth daily.    [DISCONTINUED] magnesium oxide (MAG-OX) 400 MG tablet Take 1 tablet by mouth as directed. (Patient not taking: Reported on 12/08/2021)    [DISCONTINUED] Multiple Vitamin (MULTI-VITAMIN DAILY) TABS daily.     [DISCONTINUED] pioglitazone (ACTOS) 15 MG tablet TAKE 1 TABLET BY MOUTH DAILY    No facility-administered medications prior to visit.    Review of Systems  Allergic/Immunologic: Positive for immunocompromised state.  All other systems reviewed and are negative.   Last CBC Lab  Results  Component Value Date   WBC 8.5 07/07/2019   HGB 10.5 (L) 07/07/2019   HCT 32.9 (L) 07/07/2019   MCV 92 07/07/2019   MCH 29.2 07/07/2019   RDW 13.1 07/07/2019   PLT 154 62/83/6629   Last metabolic panel Lab Results  Component Value Date   GLUCOSE 126 (H) 06/22/2018   NA 138 06/22/2018   K 3.8 06/22/2018   CL 103 06/22/2018   CO2 28 06/22/2018   BUN 42 (H) 06/22/2018   CREATININE 3.04 (H) 06/22/2018   GFRNONAA 15 (L) 06/22/2018   CALCIUM 7.2 (L) 06/22/2018   PHOS 4.3 06/22/2018   PROT 6.4 07/07/2019   ALBUMIN 4.3 07/07/2019   LABGLOB 2.4 10/17/2017   AGRATIO 1.8 10/17/2017   BILITOT 0.2 07/07/2019   ALKPHOS 109 07/07/2019   AST 21 07/07/2019   ALT 24 07/07/2019   ANIONGAP 7 06/22/2018   Last lipids Lab Results  Component Value Date   CHOL 177 07/12/2020   HDL 44 07/12/2020   LDLCALC 106 (H) 07/12/2020   TRIG 156 (H) 07/12/2020   CHOLHDL 4.0 07/12/2020   Last hemoglobin A1c Lab Results  Component Value Date   HGBA1C 5.7 (H) 07/12/2020   Last thyroid functions Lab Results  Component Value Date   TSH 2.630 07/12/2020   Last vitamin B12 and Folate Lab Results  Component Value Date   VITAMINB12 272 08/19/2015      Objective     BP 102/67 (BP Location: Left Arm, Patient Position: Sitting, Cuff Size: Large)   Pulse 85   Temp 98.7 F (37.1 C) (Oral)   Resp 16   Ht '5\' 9"'$  (1.753 m)   Wt 217 lb (98.4 kg)   BMI 32.05 kg/m  BP Readings from Last 3 Encounters:  12/08/21 102/67  05/16/21 110/60  12/10/20 (!) 147/77   Wt Readings from Last 3 Encounters:  12/08/21 217 lb (98.4 kg)  05/16/21 240 lb 11.2 oz (109.2 kg)  12/10/20 240 lb (108.9 kg)      Physical Exam Vitals reviewed.  Constitutional:      General: She is not in acute distress.    Appearance: Normal appearance. She is well-developed. She is not diaphoretic.  HENT:     Head: Normocephalic and  atraumatic.     Right Ear: Tympanic membrane, ear canal and external ear normal.      Left Ear: Tympanic membrane, ear canal and external ear normal.     Nose: Nose normal.     Mouth/Throat:     Mouth: Mucous membranes are moist.     Pharynx: Oropharynx is clear. No oropharyngeal exudate.  Eyes:     General: No scleral icterus.    Conjunctiva/sclera: Conjunctivae normal.     Pupils: Pupils are equal, round, and reactive to light.  Neck:     Thyroid: No thyromegaly.  Cardiovascular:     Rate and Rhythm: Normal rate and regular rhythm.     Pulses: Normal pulses.     Heart sounds: Normal heart sounds. No murmur heard. Pulmonary:     Effort: Pulmonary effort is normal. No respiratory distress.     Breath sounds: Normal breath sounds. No wheezing or rales.  Abdominal:     General: There is no distension.     Palpations: Abdomen is soft.     Tenderness: There is no abdominal tenderness.  Musculoskeletal:        General: No deformity.     Cervical back: Neck supple.     Right lower leg: No edema.     Left lower leg: No edema.  Lymphadenopathy:     Cervical: No cervical adenopathy.  Skin:    General: Skin is warm and dry.     Findings: No rash.  Neurological:     Mental Status: She is alert and oriented to person, place, and time. Mental status is at baseline.     Gait: Gait normal.  Psychiatric:        Mood and Affect: Mood normal.        Behavior: Behavior normal.        Thought Content: Thought content normal.       Last depression screening scores    12/08/2021    3:10 PM 11/25/2020    3:31 PM 04/26/2020   10:22 AM  PHQ 2/9 Scores  PHQ - 2 Score 0 0 0  PHQ- 9 Score 0 0    Last fall risk screening    12/08/2021    3:09 PM  Fall Risk   Falls in the past year? 0  Number falls in past yr: 0  Injury with Fall? 0  Risk for fall due to : No Fall Risks  Follow up Falls evaluation completed   Last Audit-C alcohol use screening    12/08/2021    3:10 PM  Alcohol Use Disorder Test (AUDIT)  1. How often do you have a drink containing alcohol? 0  2. How  many drinks containing alcohol do you have on a typical day when you are drinking? 0  3. How often do you have six or more drinks on one occasion? 0  AUDIT-C Score 0   A score of 3 or more in women, and 4 or more in men indicates increased risk for alcohol abuse, EXCEPT if all of the points are from question 1   No results found for any visits on 12/08/21.  Assessment & Plan    Routine Health Maintenance and Physical Exam  Exercise Activities and Dietary recommendations  Goals      Exercise 3x per week (30 min per time)     Recommend to start walking 3 days a week for at least 30 minutes at a time. New goals after transplant  Immunization History  Administered Date(s) Administered   Fluad Quad(high Dose 65+) 04/01/2019, 04/22/2020, 04/27/2021   Influenza, High Dose Seasonal PF 04/17/2018   Influenza-Unspecified 04/08/2015   PFIZER Comirnaty(Gray Top)Covid-19 Tri-Sucrose Vaccine 03/04/2020, 10/04/2020   PFIZER(Purple Top)SARS-COV-2 Vaccination 08/06/2019, 08/27/2019   Pneumococcal Conjugate-13 10/12/2016   Pneumococcal Polysaccharide-23 10/17/2017   Tdap 09/04/2011    Health Maintenance  Topic Date Due   Zoster Vaccines- Shingrix (1 of 2) Never done   OPHTHALMOLOGY EXAM  10/19/2020   COVID-19 Vaccine (5 - Booster for Pfizer series) 11/29/2020   URINE MICROALBUMIN  01/07/2021   HEMOGLOBIN A1C  01/09/2021   TETANUS/TDAP  09/03/2021   INFLUENZA VACCINE  01/31/2022   FOOT EXAM  12/09/2022   MAMMOGRAM  05/31/2023   COLONOSCOPY (Pts 45-76yr Insurance coverage will need to be confirmed)  12/10/2025   Pneumonia Vaccine 70 Years old  Completed   DEXA SCAN  Completed   Hepatitis C Screening  Completed   HPV VACCINES  Aged Out    Discussed health benefits of physical activity, and encouraged her to engage in regular exercise appropriate for her age and condition.  Problem List Items Addressed This Visit       Cardiovascular and Mediastinum   Hypertension  associated with diabetes ([place]    Well controlled No longer on meds s/p kidney transplant Reviewed recent metabolic panel      Relevant Medications   glipiZIDE (GLUCOTROL) 5 MG tablet   empagliflozin (JARDIANCE) 10 MG TABS tablet     Endocrine   Type 2 diabetes mellitus with diabetic chronic kidney disease ([place]    F/b endocrinology Borderline at last visit No changes to meds today Upcoming eye exam Will abstract last labs Foot exam today      Relevant Medications   glipiZIDE (GLUCOTROL) 5 MG tablet   empagliflozin (JARDIANCE) 10 MG TABS tablet   Hyperlipidemia associated with type 2 diabetes mellitus ([place]    Previously well controlled Continue statin Repeat FLP and CMP annually      Relevant Medications   glipiZIDE (GLUCOTROL) 5 MG tablet   empagliflozin (JARDIANCE) 10 MG TABS tablet   Parathyroid disorder (HCC)     Genitourinary   CKD (chronic kidney disease) stage 3, GFR 30-59 ml/min (HCC)    Chronic and stable Recheck metabolic panel Avoid nephrotoxic meds F/b nephrology and transplant  S/p kidney transplant        Other   Obesity    Discussed importance of healthy weight management Discussed diet and exercise       Relevant Medications   glipiZIDE (GLUCOTROL) 5 MG tablet   empagliflozin (JARDIANCE) 10 MG TABS tablet   Calcium Carbonate Antacid (TUMS PO)   Other Visit Diagnoses     Encounter for annual physical exam    -  Primary   Status post kidney transplant       Relevant Medications   tacrolimus (PROGRAF) 1 MG capsule   mycophenolate (CELLCEPT) 250 MG capsule   predniSONE (DELTASONE) 5 MG tablet   Encounter for screening mammogram for malignant neoplasm of breast       Relevant Orders   MM 3D SCREEN BREAST BILATERAL        Return in about 1 year (around 12/09/2022) for CPE, With PCP.     I, ALavon Paganini MD, have reviewed all documentation for this visit. The documentation on 12/08/21 for the exam, diagnosis, procedures, and orders  are all accurate and complete.   Yarimar Lavis, ADionne Bucy MD, MPH [facility]  [place]

## 2021-12-08 ENCOUNTER — Encounter: Payer: Self-pay | Admitting: Family Medicine

## 2021-12-08 ENCOUNTER — Ambulatory Visit (INDEPENDENT_AMBULATORY_CARE_PROVIDER_SITE_OTHER): Payer: Medicare Other | Admitting: Family Medicine

## 2021-12-08 VITALS — BP 102/67 | HR 85 | Temp 98.7°F | Resp 16 | Ht 69.0 in | Wt 217.0 lb

## 2021-12-08 DIAGNOSIS — E1122 Type 2 diabetes mellitus with diabetic chronic kidney disease: Secondary | ICD-10-CM | POA: Diagnosis not present

## 2021-12-08 DIAGNOSIS — E1169 Type 2 diabetes mellitus with other specified complication: Secondary | ICD-10-CM

## 2021-12-08 DIAGNOSIS — Z1231 Encounter for screening mammogram for malignant neoplasm of breast: Secondary | ICD-10-CM | POA: Diagnosis not present

## 2021-12-08 DIAGNOSIS — Z940 Kidney transplant status: Secondary | ICD-10-CM | POA: Diagnosis not present

## 2021-12-08 DIAGNOSIS — E669 Obesity, unspecified: Secondary | ICD-10-CM

## 2021-12-08 DIAGNOSIS — N1832 Chronic kidney disease, stage 3b: Secondary | ICD-10-CM

## 2021-12-08 DIAGNOSIS — I152 Hypertension secondary to endocrine disorders: Secondary | ICD-10-CM | POA: Diagnosis not present

## 2021-12-08 DIAGNOSIS — Z6832 Body mass index (BMI) 32.0-32.9, adult: Secondary | ICD-10-CM

## 2021-12-08 DIAGNOSIS — Z0000 Encounter for general adult medical examination without abnormal findings: Secondary | ICD-10-CM

## 2021-12-08 DIAGNOSIS — E215 Disorder of parathyroid gland, unspecified: Secondary | ICD-10-CM | POA: Diagnosis not present

## 2021-12-08 DIAGNOSIS — E785 Hyperlipidemia, unspecified: Secondary | ICD-10-CM

## 2021-12-08 DIAGNOSIS — E1159 Type 2 diabetes mellitus with other circulatory complications: Secondary | ICD-10-CM | POA: Diagnosis not present

## 2021-12-08 DIAGNOSIS — E7800 Pure hypercholesterolemia, unspecified: Secondary | ICD-10-CM

## 2021-12-08 NOTE — Assessment & Plan Note (Signed)
F/b endocrinology Borderline at last visit No changes to meds today Upcoming eye exam Will abstract last labs Foot exam today

## 2021-12-08 NOTE — Assessment & Plan Note (Signed)
Well controlled No longer on meds s/p kidney transplant Reviewed recent metabolic panel

## 2021-12-08 NOTE — Assessment & Plan Note (Signed)
Chronic and stable Recheck metabolic panel Avoid nephrotoxic meds F/b nephrology and transplant  S/p kidney transplant

## 2021-12-08 NOTE — Assessment & Plan Note (Signed)
Previously well controlled Continue statin Repeat FLP and CMP annually 

## 2021-12-08 NOTE — Patient Instructions (Signed)
Consider asking at the pharmacy about the tetanus shot (TDAP) and the shingles shot (Shingrix)

## 2021-12-08 NOTE — Assessment & Plan Note (Signed)
Discussed importance of healthy weight management Discussed diet and exercise  

## 2021-12-09 ENCOUNTER — Encounter: Payer: Self-pay | Admitting: Family Medicine

## 2021-12-12 ENCOUNTER — Other Ambulatory Visit: Payer: Self-pay

## 2021-12-21 ENCOUNTER — Ambulatory Visit (LOCAL_COMMUNITY_HEALTH_CENTER): Payer: Medicare Other

## 2021-12-21 DIAGNOSIS — Z719 Counseling, unspecified: Secondary | ICD-10-CM

## 2021-12-21 DIAGNOSIS — Z23 Encounter for immunization: Secondary | ICD-10-CM | POA: Diagnosis not present

## 2021-12-21 NOTE — Progress Notes (Signed)
  Are you feeling sick today? No   Have you ever received a dose of COVID-19 Vaccine? [employer], [place], [place], [place], Other) Yes  If yes, which vaccine and how many doses?   PFIZER 5   Did you bring the vaccination record card or other documentation?  Yes   Do you have a health condition or are undergoing treatment that makes you moderately or severely immunocompromised? This would include, but not be limited to: cancer, HIV, organ transplant, immunosuppressive therapy/high-dose corticosteroids, or moderate/severe primary immunodeficiency.  Yes, PT had kidney transplant 06/2022 per pt   Have you received COVID-19 vaccine before or during hematopoietic cell transplant (HCT) or CAR-T-cell therapies? No  Have you ever had an allergic reaction to: (This would include a severe allergic reaction or a reaction that caused hives, swelling, or respiratory distress, including wheezing.) A component of a COVID-19 vaccine or a previous dose of COVID-19 vaccine? No   Have you ever had an allergic reaction to another vaccine (other thanCOVID-19 vaccine) or an injectable medication? (This would include a severe allergic reaction or a reaction that caused hives, swelling, or respiratory distress, including wheezing.)   No    Do you have a history of any of the following:  Myocarditis or Pericarditis No  Dermal fillers:  No  Multisystem Inflammatory Syndrome (MIS-C or MIS-A)? No  COVID-19 disease within the past 3 months? No  Vaccinated with monkeypox vaccine in the last 4 weeks? No  Administered Pfizer bv booster to left deltoid and tdap to right deltoid, tolerated well. Given VIS, NCIR, and covid vaccination card. M.Nicandro Perrault, LPN.

## 2021-12-26 DIAGNOSIS — N185 Chronic kidney disease, stage 5: Secondary | ICD-10-CM | POA: Diagnosis not present

## 2021-12-26 DIAGNOSIS — Z7984 Long term (current) use of oral hypoglycemic drugs: Secondary | ICD-10-CM | POA: Diagnosis not present

## 2021-12-26 DIAGNOSIS — Z87891 Personal history of nicotine dependence: Secondary | ICD-10-CM | POA: Diagnosis not present

## 2021-12-26 DIAGNOSIS — I959 Hypotension, unspecified: Secondary | ICD-10-CM | POA: Diagnosis not present

## 2021-12-26 DIAGNOSIS — J9602 Acute respiratory failure with hypercapnia: Secondary | ICD-10-CM | POA: Diagnosis not present

## 2021-12-26 DIAGNOSIS — T380X5A Adverse effect of glucocorticoids and synthetic analogues, initial encounter: Secondary | ICD-10-CM | POA: Diagnosis not present

## 2021-12-26 DIAGNOSIS — K219 Gastro-esophageal reflux disease without esophagitis: Secondary | ICD-10-CM | POA: Diagnosis not present

## 2021-12-26 DIAGNOSIS — I10 Essential (primary) hypertension: Secondary | ICD-10-CM | POA: Diagnosis not present

## 2021-12-26 DIAGNOSIS — Z79899 Other long term (current) drug therapy: Secondary | ICD-10-CM | POA: Diagnosis not present

## 2021-12-26 DIAGNOSIS — I120 Hypertensive chronic kidney disease with stage 5 chronic kidney disease or end stage renal disease: Secondary | ICD-10-CM | POA: Diagnosis not present

## 2021-12-26 DIAGNOSIS — Z01818 Encounter for other preprocedural examination: Secondary | ICD-10-CM | POA: Diagnosis not present

## 2021-12-26 DIAGNOSIS — J9601 Acute respiratory failure with hypoxia: Secondary | ICD-10-CM | POA: Diagnosis not present

## 2021-12-26 DIAGNOSIS — E1121 Type 2 diabetes mellitus with diabetic nephropathy: Secondary | ICD-10-CM | POA: Diagnosis not present

## 2021-12-26 DIAGNOSIS — D849 Immunodeficiency, unspecified: Secondary | ICD-10-CM | POA: Diagnosis not present

## 2021-12-26 DIAGNOSIS — E0922 Drug or chemical induced diabetes mellitus with diabetic chronic kidney disease: Secondary | ICD-10-CM | POA: Diagnosis not present

## 2021-12-26 DIAGNOSIS — Z794 Long term (current) use of insulin: Secondary | ICD-10-CM | POA: Diagnosis not present

## 2021-12-26 DIAGNOSIS — I2510 Atherosclerotic heart disease of native coronary artery without angina pectoris: Secondary | ICD-10-CM | POA: Diagnosis not present

## 2021-12-26 DIAGNOSIS — Z940 Kidney transplant status: Secondary | ICD-10-CM | POA: Diagnosis not present

## 2022-01-05 DIAGNOSIS — Z87891 Personal history of nicotine dependence: Secondary | ICD-10-CM | POA: Diagnosis not present

## 2022-01-05 DIAGNOSIS — Z8249 Family history of ischemic heart disease and other diseases of the circulatory system: Secondary | ICD-10-CM | POA: Diagnosis not present

## 2022-01-05 DIAGNOSIS — I2510 Atherosclerotic heart disease of native coronary artery without angina pectoris: Secondary | ICD-10-CM | POA: Diagnosis not present

## 2022-01-05 DIAGNOSIS — Z86010 Personal history of colonic polyps: Secondary | ICD-10-CM | POA: Diagnosis not present

## 2022-01-05 DIAGNOSIS — Z87442 Personal history of urinary calculi: Secondary | ICD-10-CM | POA: Diagnosis not present

## 2022-01-05 DIAGNOSIS — D509 Iron deficiency anemia, unspecified: Secondary | ICD-10-CM | POA: Diagnosis not present

## 2022-01-05 DIAGNOSIS — K2100 Gastro-esophageal reflux disease with esophagitis, without bleeding: Secondary | ICD-10-CM | POA: Diagnosis not present

## 2022-01-05 DIAGNOSIS — Z7952 Long term (current) use of systemic steroids: Secondary | ICD-10-CM | POA: Diagnosis not present

## 2022-01-05 DIAGNOSIS — Z9889 Other specified postprocedural states: Secondary | ICD-10-CM | POA: Diagnosis not present

## 2022-01-05 DIAGNOSIS — T182XXA Foreign body in stomach, initial encounter: Secondary | ICD-10-CM | POA: Diagnosis not present

## 2022-01-05 DIAGNOSIS — I119 Hypertensive heart disease without heart failure: Secondary | ICD-10-CM | POA: Diagnosis not present

## 2022-01-05 DIAGNOSIS — K760 Fatty (change of) liver, not elsewhere classified: Secondary | ICD-10-CM | POA: Diagnosis not present

## 2022-01-05 DIAGNOSIS — Z7984 Long term (current) use of oral hypoglycemic drugs: Secondary | ICD-10-CM | POA: Diagnosis not present

## 2022-01-05 DIAGNOSIS — I10 Essential (primary) hypertension: Secondary | ICD-10-CM | POA: Diagnosis not present

## 2022-01-05 DIAGNOSIS — K2289 Other specified disease of esophagus: Secondary | ICD-10-CM | POA: Diagnosis not present

## 2022-01-05 DIAGNOSIS — Z79899 Other long term (current) drug therapy: Secondary | ICD-10-CM | POA: Diagnosis not present

## 2022-01-05 DIAGNOSIS — K209 Esophagitis, unspecified without bleeding: Secondary | ICD-10-CM | POA: Diagnosis not present

## 2022-01-05 DIAGNOSIS — E119 Type 2 diabetes mellitus without complications: Secondary | ICD-10-CM | POA: Diagnosis not present

## 2022-01-05 DIAGNOSIS — R54 Age-related physical debility: Secondary | ICD-10-CM | POA: Diagnosis not present

## 2022-01-05 DIAGNOSIS — G4733 Obstructive sleep apnea (adult) (pediatric): Secondary | ICD-10-CM | POA: Diagnosis not present

## 2022-01-05 DIAGNOSIS — K219 Gastro-esophageal reflux disease without esophagitis: Secondary | ICD-10-CM | POA: Diagnosis not present

## 2022-01-05 DIAGNOSIS — Z792 Long term (current) use of antibiotics: Secondary | ICD-10-CM | POA: Diagnosis not present

## 2022-01-05 DIAGNOSIS — Z800 Family history of malignant neoplasm of digestive organs: Secondary | ICD-10-CM | POA: Diagnosis not present

## 2022-01-05 DIAGNOSIS — G4730 Sleep apnea, unspecified: Secondary | ICD-10-CM | POA: Diagnosis not present

## 2022-01-05 DIAGNOSIS — Z940 Kidney transplant status: Secondary | ICD-10-CM | POA: Diagnosis not present

## 2022-01-09 DIAGNOSIS — Z940 Kidney transplant status: Secondary | ICD-10-CM | POA: Diagnosis not present

## 2022-01-09 DIAGNOSIS — D849 Immunodeficiency, unspecified: Secondary | ICD-10-CM | POA: Diagnosis not present

## 2022-01-19 DIAGNOSIS — Z961 Presence of intraocular lens: Secondary | ICD-10-CM | POA: Diagnosis not present

## 2022-01-19 DIAGNOSIS — H2511 Age-related nuclear cataract, right eye: Secondary | ICD-10-CM | POA: Diagnosis not present

## 2022-01-19 DIAGNOSIS — E119 Type 2 diabetes mellitus without complications: Secondary | ICD-10-CM | POA: Diagnosis not present

## 2022-01-19 DIAGNOSIS — H524 Presbyopia: Secondary | ICD-10-CM | POA: Diagnosis not present

## 2022-01-26 DIAGNOSIS — D849 Immunodeficiency, unspecified: Secondary | ICD-10-CM | POA: Diagnosis not present

## 2022-01-26 DIAGNOSIS — Z940 Kidney transplant status: Secondary | ICD-10-CM | POA: Diagnosis not present

## 2022-02-02 DIAGNOSIS — D849 Immunodeficiency, unspecified: Secondary | ICD-10-CM | POA: Diagnosis not present

## 2022-02-02 DIAGNOSIS — Z940 Kidney transplant status: Secondary | ICD-10-CM | POA: Diagnosis not present

## 2022-02-09 DIAGNOSIS — Z940 Kidney transplant status: Secondary | ICD-10-CM | POA: Diagnosis not present

## 2022-02-09 DIAGNOSIS — R7989 Other specified abnormal findings of blood chemistry: Secondary | ICD-10-CM | POA: Diagnosis not present

## 2022-02-09 DIAGNOSIS — Z79899 Other long term (current) drug therapy: Secondary | ICD-10-CM | POA: Diagnosis not present

## 2022-02-09 DIAGNOSIS — E559 Vitamin D deficiency, unspecified: Secondary | ICD-10-CM | POA: Diagnosis not present

## 2022-02-28 DIAGNOSIS — D849 Immunodeficiency, unspecified: Secondary | ICD-10-CM | POA: Diagnosis not present

## 2022-02-28 DIAGNOSIS — Z940 Kidney transplant status: Secondary | ICD-10-CM | POA: Diagnosis not present

## 2022-03-13 DIAGNOSIS — Z23 Encounter for immunization: Secondary | ICD-10-CM | POA: Diagnosis not present

## 2022-03-13 DIAGNOSIS — E1122 Type 2 diabetes mellitus with diabetic chronic kidney disease: Secondary | ICD-10-CM | POA: Diagnosis not present

## 2022-03-13 DIAGNOSIS — Z794 Long term (current) use of insulin: Secondary | ICD-10-CM | POA: Diagnosis not present

## 2022-03-13 DIAGNOSIS — N189 Chronic kidney disease, unspecified: Secondary | ICD-10-CM | POA: Diagnosis not present

## 2022-03-15 DIAGNOSIS — D849 Immunodeficiency, unspecified: Secondary | ICD-10-CM | POA: Diagnosis not present

## 2022-03-15 DIAGNOSIS — Z940 Kidney transplant status: Secondary | ICD-10-CM | POA: Diagnosis not present

## 2022-03-17 DIAGNOSIS — D2260 Melanocytic nevi of unspecified upper limb, including shoulder: Secondary | ICD-10-CM | POA: Diagnosis not present

## 2022-03-17 DIAGNOSIS — L820 Inflamed seborrheic keratosis: Secondary | ICD-10-CM | POA: Diagnosis not present

## 2022-03-17 DIAGNOSIS — D692 Other nonthrombocytopenic purpura: Secondary | ICD-10-CM | POA: Diagnosis not present

## 2022-03-17 DIAGNOSIS — L821 Other seborrheic keratosis: Secondary | ICD-10-CM | POA: Diagnosis not present

## 2022-03-17 DIAGNOSIS — D849 Immunodeficiency, unspecified: Secondary | ICD-10-CM | POA: Diagnosis not present

## 2022-03-17 DIAGNOSIS — D225 Melanocytic nevi of trunk: Secondary | ICD-10-CM | POA: Diagnosis not present

## 2022-03-17 DIAGNOSIS — L659 Nonscarring hair loss, unspecified: Secondary | ICD-10-CM | POA: Diagnosis not present

## 2022-03-17 DIAGNOSIS — L814 Other melanin hyperpigmentation: Secondary | ICD-10-CM | POA: Diagnosis not present

## 2022-03-17 DIAGNOSIS — D2270 Melanocytic nevi of unspecified lower limb, including hip: Secondary | ICD-10-CM | POA: Diagnosis not present

## 2022-03-17 DIAGNOSIS — B079 Viral wart, unspecified: Secondary | ICD-10-CM | POA: Diagnosis not present

## 2022-03-25 IMAGING — CR DG ABDOMEN 1V
1 series · 3 of 3 positions shown · non-contrast
Comparison: 03/06/2019

CLINICAL DATA: Nephrolithiasis.

EXAM:
ABDOMEN - 1 VIEW

[Series 1: dg abd 1 view · 0.14mm/px · 3 of 3 slices shown]
[im 1/3]
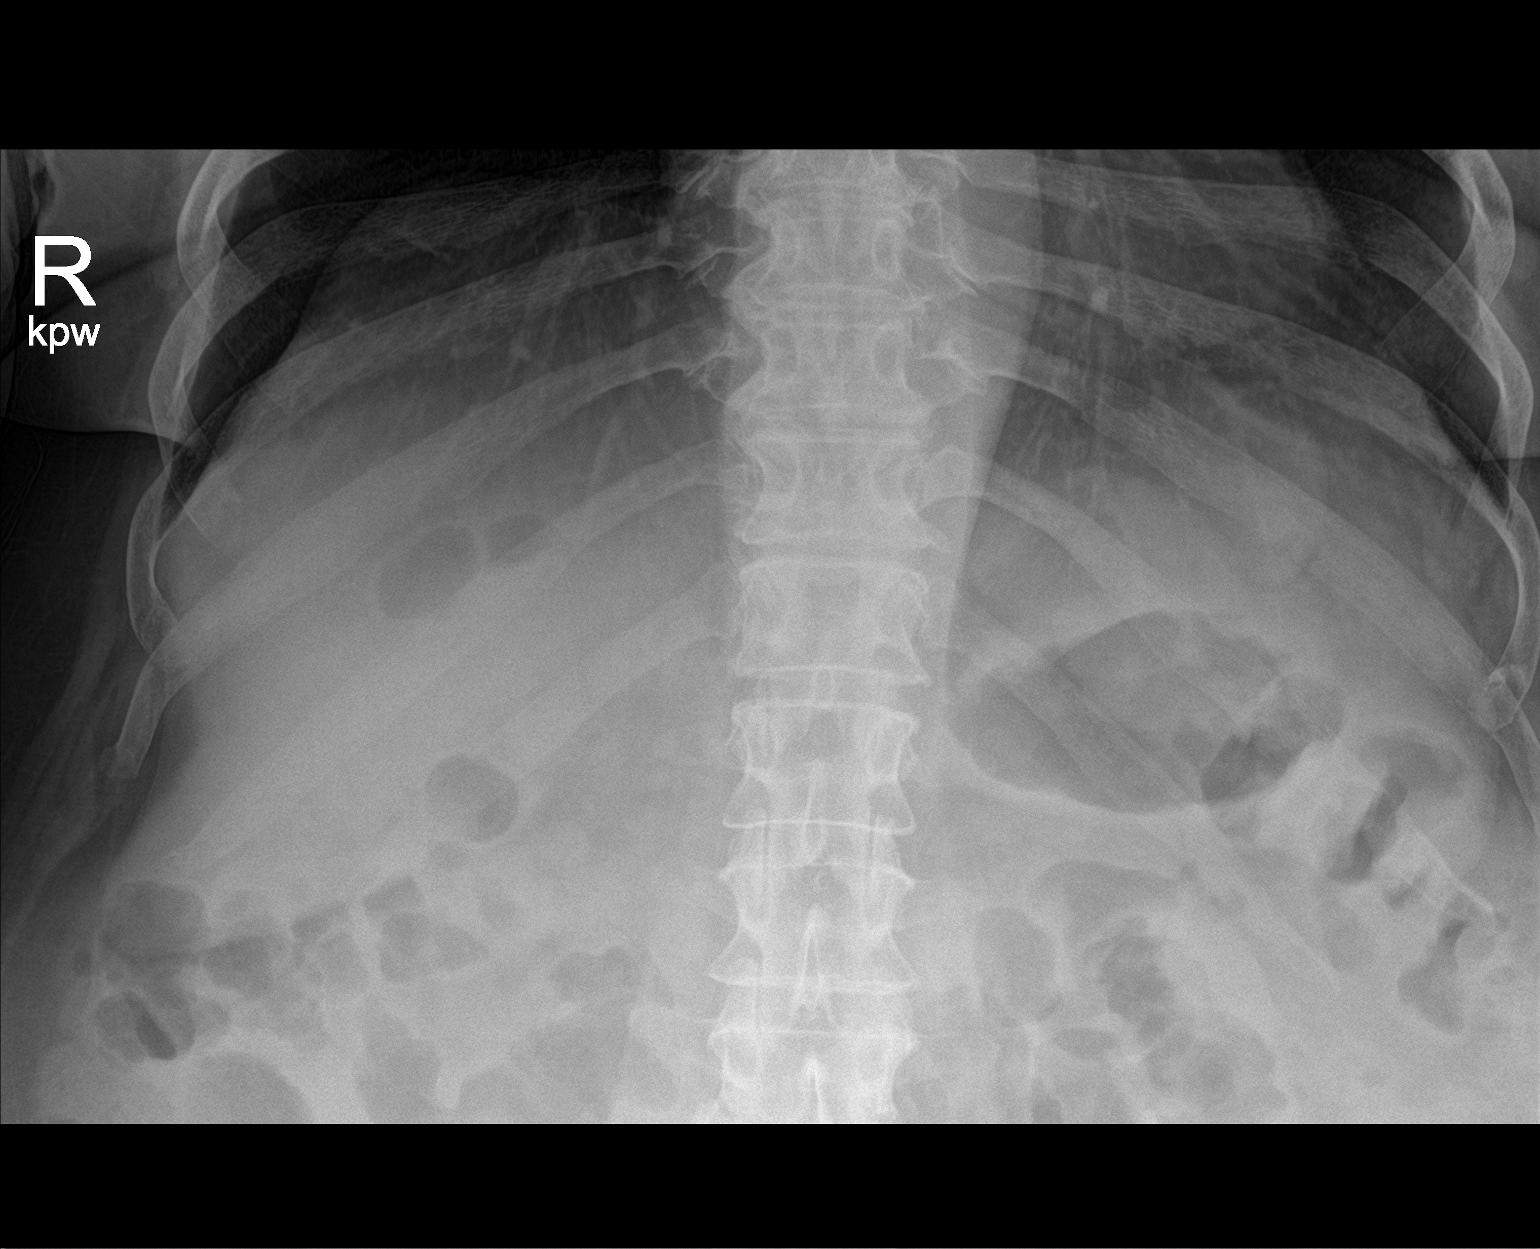
[im 2/3]
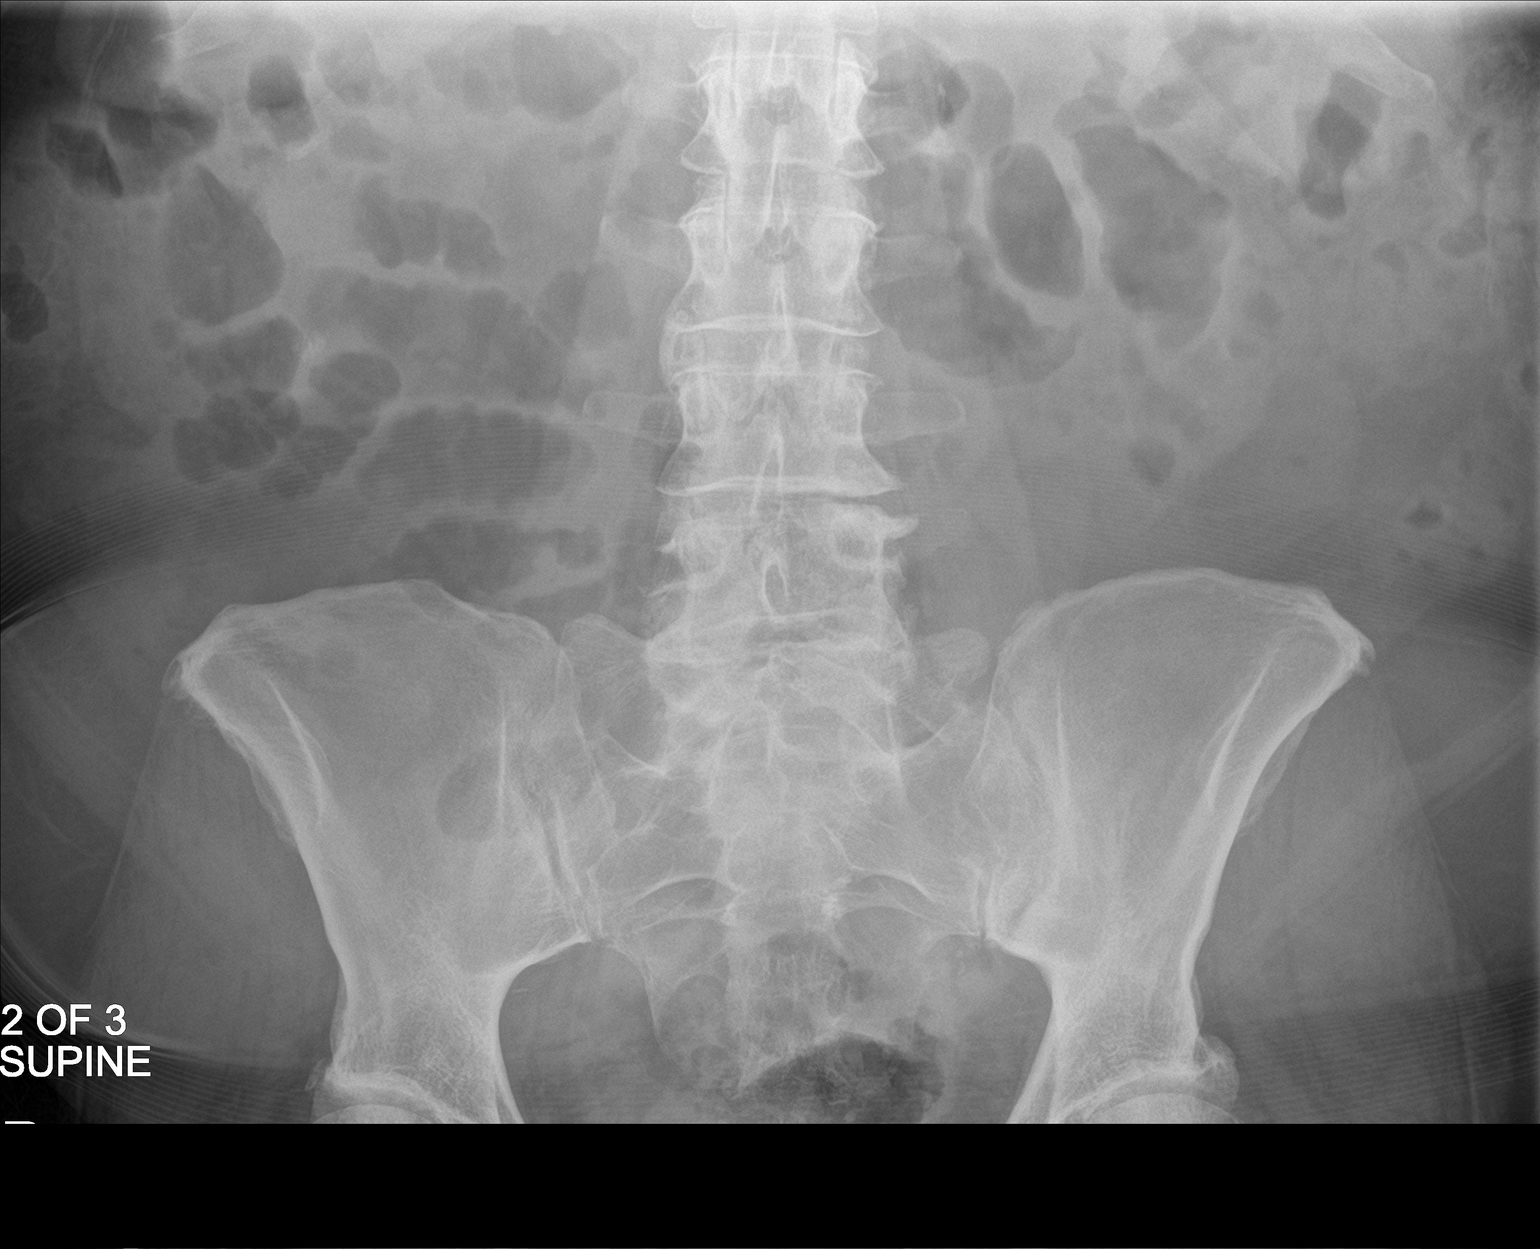
[im 3/3]
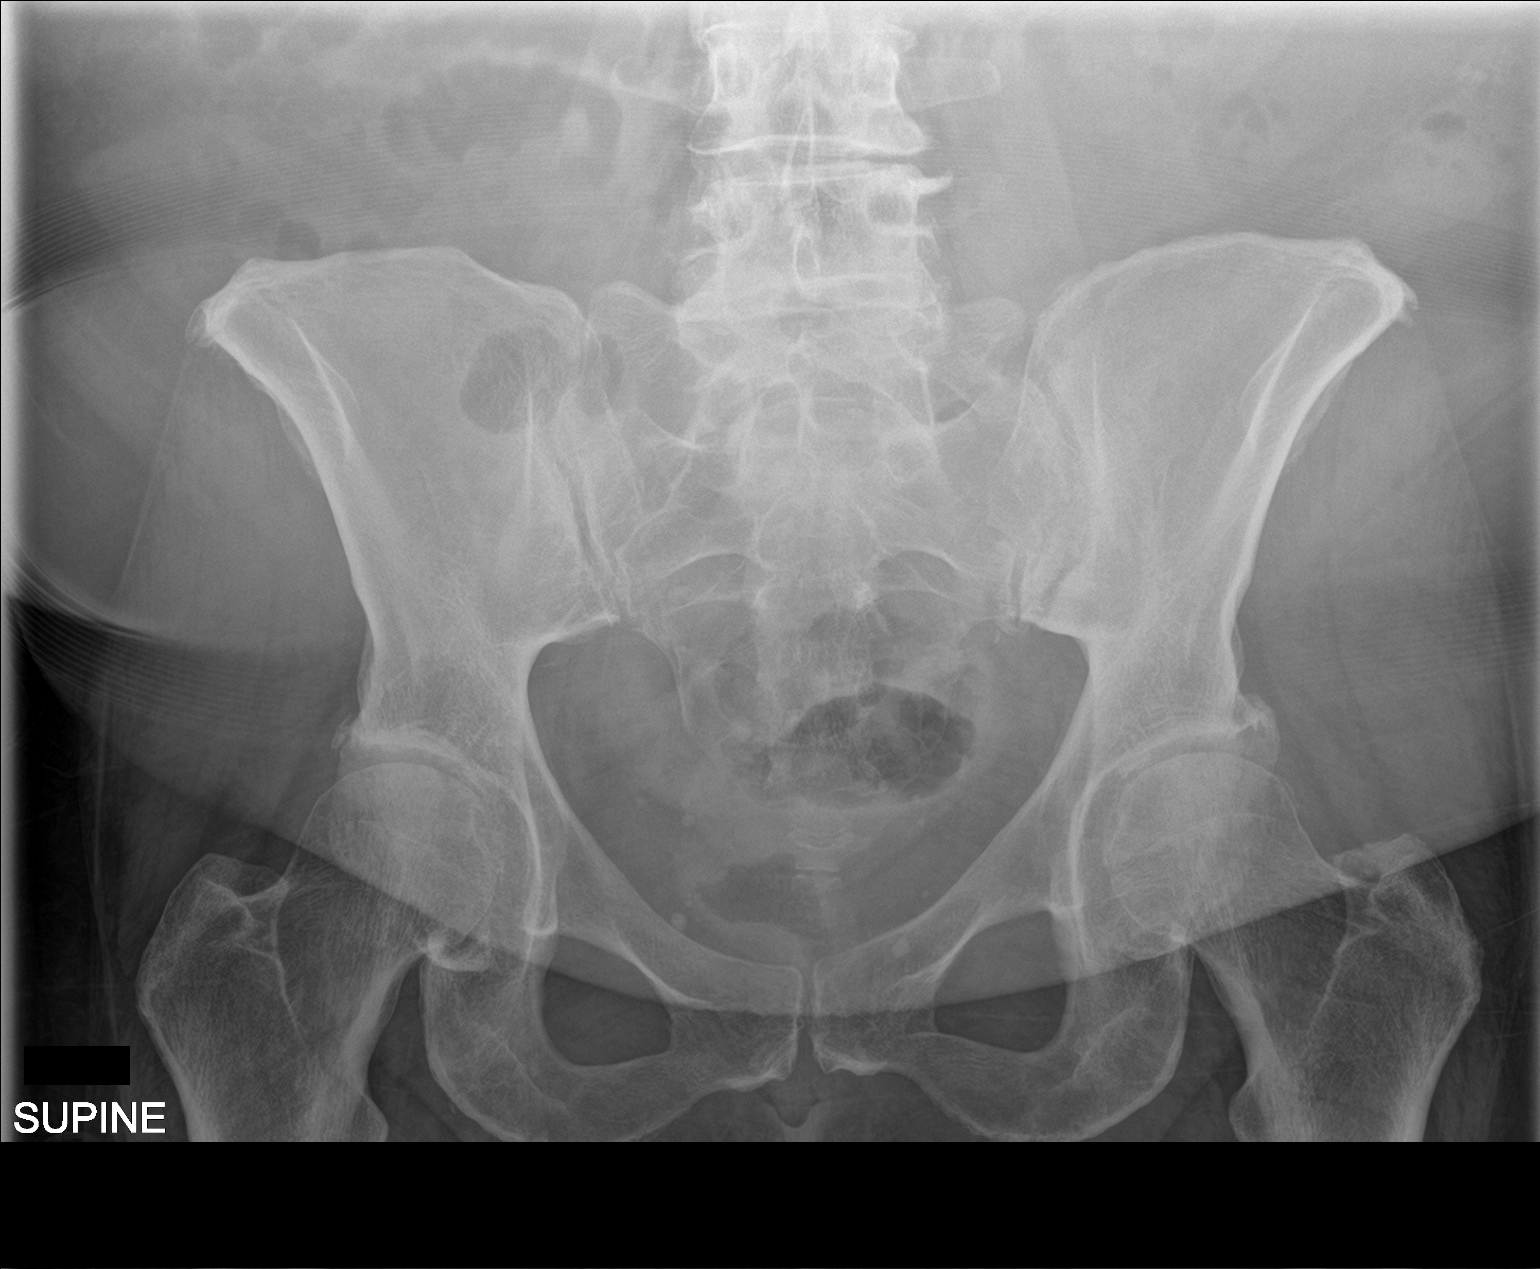

[3 of 3 positions shown; findings below may reference images not displayed]

FINDINGS: Approximately 4 x 7 mm calculus right lower pole appears larger
compared with last year. Small renal calculi on the left partially
obscured by bowel gas.

Prominent bowel gas diffusely without bowel obstruction or ileus.
Lumbar spine degenerative change.
IMPRESSION: 4 x 7 mm right lower pole calculus appears larger. Small left renal
calculi partially obscured by bowel gas.

## 2022-03-29 DIAGNOSIS — K209 Esophagitis, unspecified without bleeding: Secondary | ICD-10-CM | POA: Diagnosis not present

## 2022-03-29 DIAGNOSIS — Z01818 Encounter for other preprocedural examination: Secondary | ICD-10-CM | POA: Diagnosis not present

## 2022-03-30 DIAGNOSIS — D849 Immunodeficiency, unspecified: Secondary | ICD-10-CM | POA: Diagnosis not present

## 2022-03-30 DIAGNOSIS — Z940 Kidney transplant status: Secondary | ICD-10-CM | POA: Diagnosis not present

## 2022-04-06 DIAGNOSIS — Z7952 Long term (current) use of systemic steroids: Secondary | ICD-10-CM | POA: Diagnosis not present

## 2022-04-06 DIAGNOSIS — Z79621 Long term (current) use of calcineurin inhibitor: Secondary | ICD-10-CM | POA: Diagnosis not present

## 2022-04-06 DIAGNOSIS — Z940 Kidney transplant status: Secondary | ICD-10-CM | POA: Diagnosis not present

## 2022-04-06 DIAGNOSIS — Z79899 Other long term (current) drug therapy: Secondary | ICD-10-CM | POA: Diagnosis not present

## 2022-04-06 DIAGNOSIS — Z79624 Long term (current) use of inhibitors of nucleotide synthesis: Secondary | ICD-10-CM | POA: Diagnosis not present

## 2022-04-06 DIAGNOSIS — Z7984 Long term (current) use of oral hypoglycemic drugs: Secondary | ICD-10-CM | POA: Diagnosis not present

## 2022-04-06 DIAGNOSIS — Z794 Long term (current) use of insulin: Secondary | ICD-10-CM | POA: Diagnosis not present

## 2022-04-06 DIAGNOSIS — D84821 Immunodeficiency due to drugs: Secondary | ICD-10-CM | POA: Diagnosis not present

## 2022-04-06 DIAGNOSIS — Z87891 Personal history of nicotine dependence: Secondary | ICD-10-CM | POA: Diagnosis not present

## 2022-04-06 DIAGNOSIS — E119 Type 2 diabetes mellitus without complications: Secondary | ICD-10-CM | POA: Diagnosis not present

## 2022-04-06 DIAGNOSIS — Z4822 Encounter for aftercare following kidney transplant: Secondary | ICD-10-CM | POA: Diagnosis not present

## 2022-04-07 DIAGNOSIS — E1121 Type 2 diabetes mellitus with diabetic nephropathy: Secondary | ICD-10-CM | POA: Diagnosis not present

## 2022-04-07 DIAGNOSIS — Z794 Long term (current) use of insulin: Secondary | ICD-10-CM | POA: Diagnosis not present

## 2022-04-07 DIAGNOSIS — Z79899 Other long term (current) drug therapy: Secondary | ICD-10-CM | POA: Diagnosis not present

## 2022-04-12 DIAGNOSIS — I120 Hypertensive chronic kidney disease with stage 5 chronic kidney disease or end stage renal disease: Secondary | ICD-10-CM | POA: Diagnosis not present

## 2022-04-12 DIAGNOSIS — K2970 Gastritis, unspecified, without bleeding: Secondary | ICD-10-CM | POA: Diagnosis not present

## 2022-04-12 DIAGNOSIS — I2510 Atherosclerotic heart disease of native coronary artery without angina pectoris: Secondary | ICD-10-CM | POA: Diagnosis not present

## 2022-04-12 DIAGNOSIS — K449 Diaphragmatic hernia without obstruction or gangrene: Secondary | ICD-10-CM | POA: Diagnosis not present

## 2022-04-12 DIAGNOSIS — K317 Polyp of stomach and duodenum: Secondary | ICD-10-CM | POA: Diagnosis not present

## 2022-04-12 DIAGNOSIS — Z800 Family history of malignant neoplasm of digestive organs: Secondary | ICD-10-CM | POA: Diagnosis not present

## 2022-04-12 DIAGNOSIS — K2100 Gastro-esophageal reflux disease with esophagitis, without bleeding: Secondary | ICD-10-CM | POA: Diagnosis not present

## 2022-04-12 DIAGNOSIS — Z7984 Long term (current) use of oral hypoglycemic drugs: Secondary | ICD-10-CM | POA: Diagnosis not present

## 2022-04-12 DIAGNOSIS — K3189 Other diseases of stomach and duodenum: Secondary | ICD-10-CM | POA: Diagnosis not present

## 2022-04-12 DIAGNOSIS — Z79899 Other long term (current) drug therapy: Secondary | ICD-10-CM | POA: Diagnosis not present

## 2022-04-12 DIAGNOSIS — Z87891 Personal history of nicotine dependence: Secondary | ICD-10-CM | POA: Diagnosis not present

## 2022-04-12 DIAGNOSIS — K209 Esophagitis, unspecified without bleeding: Secondary | ICD-10-CM | POA: Diagnosis not present

## 2022-04-12 DIAGNOSIS — D631 Anemia in chronic kidney disease: Secondary | ICD-10-CM | POA: Diagnosis not present

## 2022-04-12 DIAGNOSIS — D509 Iron deficiency anemia, unspecified: Secondary | ICD-10-CM | POA: Diagnosis not present

## 2022-04-12 DIAGNOSIS — E1122 Type 2 diabetes mellitus with diabetic chronic kidney disease: Secondary | ICD-10-CM | POA: Diagnosis not present

## 2022-04-12 DIAGNOSIS — K760 Fatty (change of) liver, not elsewhere classified: Secondary | ICD-10-CM | POA: Diagnosis not present

## 2022-04-12 DIAGNOSIS — N186 End stage renal disease: Secondary | ICD-10-CM | POA: Diagnosis not present

## 2022-04-12 DIAGNOSIS — Z8249 Family history of ischemic heart disease and other diseases of the circulatory system: Secondary | ICD-10-CM | POA: Diagnosis not present

## 2022-04-12 DIAGNOSIS — Z794 Long term (current) use of insulin: Secondary | ICD-10-CM | POA: Diagnosis not present

## 2022-04-12 DIAGNOSIS — D649 Anemia, unspecified: Secondary | ICD-10-CM | POA: Diagnosis not present

## 2022-04-12 DIAGNOSIS — G4730 Sleep apnea, unspecified: Secondary | ICD-10-CM | POA: Diagnosis not present

## 2022-04-12 DIAGNOSIS — Z940 Kidney transplant status: Secondary | ICD-10-CM | POA: Diagnosis not present

## 2022-04-12 DIAGNOSIS — E1121 Type 2 diabetes mellitus with diabetic nephropathy: Secondary | ICD-10-CM | POA: Diagnosis not present

## 2022-04-12 DIAGNOSIS — E890 Postprocedural hypothyroidism: Secondary | ICD-10-CM | POA: Diagnosis not present

## 2022-05-02 DIAGNOSIS — D849 Immunodeficiency, unspecified: Secondary | ICD-10-CM | POA: Diagnosis not present

## 2022-05-02 DIAGNOSIS — Z940 Kidney transplant status: Secondary | ICD-10-CM | POA: Diagnosis not present

## 2022-05-17 ENCOUNTER — Ambulatory Visit (INDEPENDENT_AMBULATORY_CARE_PROVIDER_SITE_OTHER): Payer: Medicare Other

## 2022-05-17 VITALS — BP 134/68 | Ht 69.0 in | Wt 235.0 lb

## 2022-05-17 DIAGNOSIS — Z0000 Encounter for general adult medical examination without abnormal findings: Secondary | ICD-10-CM

## 2022-05-17 NOTE — Patient Instructions (Signed)
Debra Paul , Thank you for taking time to come for your Medicare Wellness Visit. I appreciate your ongoing commitment to your health goals. Please review the following plan we discussed and let me know if I can assist you in the future.   Screening recommendations/referrals: Colonoscopy: 12/10/20 Mammogram: 05/31/22 appt Bone Density: 11/23/16 Recommended yearly ophthalmology/optometry visit for glaucoma screening and checkup Recommended yearly dental visit for hygiene and checkup  Vaccinations: Influenza vaccine: 03/03/22 Pneumococcal vaccine: 10/17/17 Tdap vaccine: 12/21/21 Shingles vaccine: Shingrix 05/03/22   Covid-19:08/06/19, 08/27/19, 03/04/20, 10/04/20, 04/29/21, 12/21/21  Advanced directives: no  Conditions/risks identified: none  Next appointment: Follow up in one year for your annual wellness visit 05/21/23 @ 11:00 am in person   Preventive Care 65 Years and Older, Female Preventive care refers to lifestyle choices and visits with your health care provider that can promote health and wellness. What does preventive care include? A yearly physical exam. This is also called an annual well check. Dental exams once or twice a year. Routine eye exams. Ask your health care provider how often you should have your eyes checked. Personal lifestyle choices, including: Daily care of your teeth and gums. Regular physical activity. Eating a healthy diet. Avoiding tobacco and drug use. Limiting alcohol use. Practicing safe sex. Taking low-dose aspirin every day. Taking vitamin and mineral supplements as recommended by your health care provider. What happens during an annual well check? The services and screenings done by your health care provider during your annual well check will depend on your age, overall health, lifestyle risk factors, and family history of disease. Counseling  Your health care provider may ask you questions about your: Alcohol use. Tobacco use. Drug  use. Emotional well-being. Home and relationship well-being. Sexual activity. Eating habits. History of falls. Memory and ability to understand (cognition). Work and work Statistician. Reproductive health. Screening  You may have the following tests or measurements: Height, weight, and BMI. Blood pressure. Lipid and cholesterol levels. These may be checked every 5 years, or more frequently if you are over 80 years old. Skin check. Lung cancer screening. You may have this screening every year starting at age 83 if you have a 30-pack-year history of smoking and currently smoke or have quit within the past 15 years. Fecal occult blood test (FOBT) of the stool. You may have this test every year starting at age 25. Flexible sigmoidoscopy or colonoscopy. You may have a sigmoidoscopy every 5 years or a colonoscopy every 10 years starting at age 31. Hepatitis C blood test. Hepatitis B blood test. Sexually transmitted disease (STD) testing. Diabetes screening. This is done by checking your blood sugar (glucose) after you have not eaten for a while (fasting). You may have this done every 1-3 years. Bone density scan. This is done to screen for osteoporosis. You may have this done starting at age 85. Mammogram. This may be done every 1-2 years. Talk to your health care provider about how often you should have regular mammograms. Talk with your health care provider about your test results, treatment options, and if necessary, the need for more tests. Vaccines  Your health care provider may recommend certain vaccines, such as: Influenza vaccine. This is recommended every year. Tetanus, diphtheria, and acellular pertussis (Tdap, Td) vaccine. You may need a Td booster every 10 years. Zoster vaccine. You may need this after age 55. Pneumococcal 13-valent conjugate (PCV13) vaccine. One dose is recommended after age 5. Pneumococcal polysaccharide (PPSV23) vaccine. One dose is recommended after age  65. Talk to your health care provider about which screenings and vaccines you need and how often you need them. This information is not intended to replace advice given to you by your health care provider. Make sure you discuss any questions you have with your health care provider. Document Released: 07/16/2015 Document Revised: 03/08/2016 Document Reviewed: 04/20/2015 Elsevier Interactive Patient Education  2017 [place] Prevention in the Home Falls can cause injuries. They can happen to people of all ages. There are many things you can do to make your home safe and to help prevent falls. What can I do on the outside of my home? Regularly fix the edges of walkways and driveways and fix any cracks. Remove anything that might make you trip as you walk through a door, such as a raised step or threshold. Trim any bushes or trees on the path to your home. Use bright outdoor lighting. Clear any walking paths of anything that might make someone trip, such as rocks or tools. Regularly check to see if handrails are loose or broken. Make sure that both sides of any steps have handrails. Any raised decks and porches should have guardrails on the edges. Have any leaves, snow, or ice cleared regularly. Use sand or salt on walking paths during winter. Clean up any spills in your garage right away. This includes oil or grease spills. What can I do in the bathroom? Use night lights. Install grab bars by the toilet and in the tub and shower. Do not use towel bars as grab bars. Use non-skid mats or decals in the tub or shower. If you need to sit down in the shower, use a plastic, non-slip stool. Keep the floor dry. Clean up any water that spills on the floor as soon as it happens. Remove soap buildup in the tub or shower regularly. Attach bath mats securely with double-sided non-slip rug tape. Do not have throw rugs and other things on the floor that can make you trip. What can I do in the  bedroom? Use night lights. Make sure that you have a light by your bed that is easy to reach. Do not use any sheets or blankets that are too big for your bed. They should not hang down onto the floor. Have a firm chair that has side arms. You can use this for support while you get dressed. Do not have throw rugs and other things on the floor that can make you trip. What can I do in the kitchen? Clean up any spills right away. Avoid walking on wet floors. Keep items that you use a lot in easy-to-reach places. If you need to reach something above you, use a strong step stool that has a grab bar. Keep electrical cords out of the way. Do not use floor polish or wax that makes floors slippery. If you must use wax, use non-skid floor wax. Do not have throw rugs and other things on the floor that can make you trip. What can I do with my stairs? Do not leave any items on the stairs. Make sure that there are handrails on both sides of the stairs and use them. Fix handrails that are broken or loose. Make sure that handrails are as long as the stairways. Check any carpeting to make sure that it is firmly attached to the stairs. Fix any carpet that is loose or worn. Avoid having throw rugs at the top or bottom of the stairs. If you do have throw  rugs, attach them to the floor with carpet tape. Make sure that you have a light switch at the top of the stairs and the bottom of the stairs. If you do not have them, ask someone to add them for you. What else can I do to help prevent falls? Wear shoes that: Do not have high heels. Have rubber bottoms. Are comfortable and fit you well. Are closed at the toe. Do not wear sandals. If you use a stepladder: Make sure that it is fully opened. Do not climb a closed stepladder. Make sure that both sides of the stepladder are locked into place. Ask someone to hold it for you, if possible. Clearly mark and make sure that you can see: Any grab bars or  handrails. First and last steps. Where the edge of each step is. Use tools that help you move around (mobility aids) if they are needed. These include: Canes. Walkers. Scooters. Crutches. Turn on the lights when you go into a dark area. Replace any light bulbs as soon as they burn out. Set up your furniture so you have a clear path. Avoid moving your furniture around. If any of your floors are uneven, fix them. If there are any pets around you, be aware of where they are. Review your medicines with your doctor. Some medicines can make you feel dizzy. This can increase your chance of falling. Ask your doctor what other things that you can do to help prevent falls. This information is not intended to replace advice given to you by your health care provider. Make sure you discuss any questions you have with your health care provider. Document Released: 04/15/2009 Document Revised: 11/25/2015 Document Reviewed: 07/24/2014 Elsevier Interactive Patient Education  2017 [employer].

## 2022-05-17 NOTE — Progress Notes (Signed)
Subjective:   KAWENA LYDAY is a 71 y.o. female who presents for Medicare Annual (Subsequent) preventive examination.  Review of Systems     Cardiac Risk Factors include: advanced age (>37mn, >>62women);diabetes mellitus;dyslipidemia;hypertension;obesity (BMI >30kg/m2)     Objective:    Today's Vitals   05/17/22 1050  BP: 134/68  Weight: 235 lb (106.6 kg)  Height: '5\' 9"'$  (1.753 m)   Body mass index is 34.7 kg/m.     05/17/2022   11:03 AM 12/10/2020    8:41 AM 04/26/2020   10:26 AM 04/22/2019    2:08 PM 07/23/2018    3:26 PM 06/21/2018    9:52 AM 06/17/2018    7:44 PM  Advanced Directives  Does Patient Have a Medical Advance Directive? No No No No No No No  Would patient like information on creating a medical advance directive? No - Patient declined  No - Patient declined No - Patient declined No - Patient declined No - Patient declined     Current Medications (verified) Outpatient Encounter Medications as of 05/17/2022  Medication Sig   acetaminophen (TYLENOL) 325 MG tablet Take 2 tablets (650 mg total) by mouth every 6 (six) hours as needed for mild pain (or Fever >/= 101).   Biotin 1000 MCG tablet Take 1,000 mcg by mouth daily.   Calcium Carbonate Antacid (TUMS PO) 750 mg in the morning, at noon, and at bedtime.   Cholecalciferol (VITAMIN D3) 50 MCG (2000 UT) capsule Take 1,000 Units by mouth daily.   ezetimibe (ZETIA) 10 MG tablet Take 1 tablet by mouth as directed.   famotidine (PEPCID) 20 MG tablet Take by mouth.   glipiZIDE (GLUCOTROL) 5 MG tablet in the morning and at bedtime.   Multiple Vitamin (MULTI-VITAMIN) tablet Take 1 tablet by mouth daily. CENTRUM SILVER   mycophenolate (CELLCEPT) 250 MG capsule in the morning, at noon, in the evening, and at bedtime.   OVER THE COUNTER MEDICATION Magnesium Plus Protein '133mg'$  2 tablets at 1pm and 5pm   PANTOPRAZOLE SODIUM PO Take 40 mg by mouth daily in the afternoon.   pravastatin (PRAVACHOL) 20 MG tablet TAKE  1 TABLET BY MOUTH AT  BEDTIME   predniSONE (DELTASONE) 5 MG tablet Take by mouth.   sodium bicarbonate 650 MG tablet Take 1,300 mg by mouth 2 (two) times daily.    tacrolimus (PROGRAF) 1 MG capsule in the morning, at noon, and at bedtime.   empagliflozin (JARDIANCE) 10 MG TABS tablet Take 1 tablet by mouth daily.   Sulfamethoxazole-Trimethoprim (BACTRIM DS PO) One tablet Mon. Wed., and Friday (Patient not taking: Reported on 05/17/2022)   No facility-administered encounter medications on file as of 05/17/2022.    Allergies (verified) Prilosec [omeprazole] and Ciprofloxacin   History: Past Medical History:  Diagnosis Date   Anemia    Diabetes mellitus without complication (HCC)    GERD (gastroesophageal reflux disease)    Guaiac positive stools    History of renal calculi    Hypercholesteremia    Hypertension    Kidney stones    Takes lisinopril for kidney protection   Kidney stones    Parathyroid disorder ([facility]    Sleep apnea    does not use CPAP   Vertigo    in past   Past Surgical History:  Procedure Laterality Date   CATARACT EXTRACTION W/PHACO Left 05/14/2017   Procedure: CATARACT EXTRACTION PHACO AND INTRAOCULAR LENS PLACEMENT (IBrowning LEFT DIABETIC;  Surgeon: BLeandrew Koyanagi MD;  [facility] [facility]  Service: Ophthalmology;  Laterality: Left;  diabetic   COLONOSCOPY     2003,2007,2015,2017   COLONOSCOPY WITH PROPOFOL N/A 12/10/2020   Procedure: COLONOSCOPY WITH PROPOFOL;  Surgeon: [name], MD;  Location: [facility];  Service: Endoscopy;  Laterality: N/A;  DM   CYSTOSCOPY WITH URETEROSCOPY AND STENT PLACEMENT  09/29/2015   Dr. Bernardo Heater   DIALYSIS/PERMA CATHETER INSERTION Left 06/21/2018   Procedure: DIALYSIS/PERMA CATHETER INSERTION;  Surgeon: Katha Cabal, MD;  Location: [facility];  Service: Cardiovascular;  Laterality: Left;   DIALYSIS/PERMA CATHETER REMOVAL N/A 07/18/2018   Procedure: DIALYSIS/PERMA CATHETER REMOVAL;   Surgeon: Algernon Huxley, MD;  Location: [place] [facility];  Service: Cardiovascular;  Laterality: N/A;   ESOPHAGOGASTRODUODENOSCOPY ENDOSCOPY     LITHOTRIPSY  2002   PARATHYROIDECTOMY  1990   Family History  Problem Relation Age of Onset   Heart disease Mother    Atrial fibrillation Mother    Colon cancer Father    Coronary artery disease Maternal Grandmother    Breast cancer Neg Hx    Social History   Socioeconomic History   Marital status: Single    Spouse name: Not on file   Number of children: 1   Years of education: Not on file   Highest education level: Some college, no degree  Occupational History   Occupation: retired  Tobacco Use   Smoking status: Never   Smokeless tobacco: Never  Vaping Use   Vaping Use: Never used  Substance and Sexual Activity   Alcohol use: No    Comment: 2-3 times a year   Drug use: No   Sexual activity: Not Currently  Other Topics Concern   Not on file  Social History Narrative   Was legally separated at time of husbands death.   Social Determinants of Health   Financial Resource Strain: Low Risk  (05/17/2022)   Overall Financial Resource Strain (CARDIA)    Difficulty of Paying Living Expenses: Not hard at all  Food Insecurity: No Food Insecurity (05/17/2022)   Hunger Vital Sign    Worried About Running Out of Food in the Last Year: Never true    Ran Out of Food in the Last Year: Never true  Transportation Needs: No Transportation Needs (05/17/2022)   PRAPARE - Hydrologist (Medical): No    Lack of Transportation (Non-Medical): No  Physical Activity: Sufficiently Active (05/17/2022)   Exercise Vital Sign    Days of Exercise per Week: 3 days    Minutes of Exercise per Session: 60 min  Stress: No Stress Concern Present (05/17/2022)   [place]    Feeling of Stress : Not at all  Social Connections: Moderately Integrated (05/17/2022)    Social Connection and Isolation Panel [NHANES]    Frequency of Communication with Friends and Family: More than three times a week    Frequency of Social Gatherings with Friends and Family: More than three times a week    Attends Religious Services: More than 4 times per year    Active Member of [employer] or Organizations: Yes    Attends Archivist Meetings: More than 4 times per year    Marital Status: Widowed    Tobacco Counseling Counseling given: Not Answered   Clinical Intake:  Pre-visit preparation completed: Yes  Pain : No/denies pain     Nutritional Risks: None Diabetes: Yes CBG done?: No Did pt. bring in CBG monitor from  home?: No  How often do you need to have someone help you when you read instructions, pamphlets, or other written materials from your doctor or pharmacy?: 1 - Never  Diabetic?yes Nutrition Risk Assessment:  Has the patient had any N/V/D within the last 2 months?  No  Does the patient have any non-healing wounds?  No  Has the patient had any unintentional weight loss or weight gain?  No   Diabetes:  Is the patient diabetic?  Yes  If diabetic, was a CBG obtained today?  Yes  Did the patient bring in their glucometer from home?  Yes  How often do you monitor your CBG's? Three times/day.   Financial Strains and Diabetes Management:  Are you having any financial strains with the device, your supplies or your medication? No .  Does the patient want to be seen by Chronic Care Management for management of their diabetes?  No  Would the patient like to be referred to a Nutritionist or for Diabetic Management?  No   Diabetic Exams:  Diabetic Eye Exam: Completed 01/19/22 . Pt has been advised about the importance in completing this exam.   Diabetic Foot Exam: Completed 12/08/21. Pt has been advised about the importance in completing this exam.   Interpreter Needed?: No  Information entered by :: Kirke Shaggy, LPN   Activities of Daily  Living    05/17/2022   11:04 AM 05/13/2022   11:35 AM  In your present state of health, do you have any difficulty performing the following activities:  Hearing? 0 0  Vision? 0 0  Difficulty concentrating or making decisions? 0 0  Walking or climbing stairs? 0 0  Dressing or bathing? 0 0  Doing errands, shopping? 0 0  Preparing Food and eating ? N N  Using the Toilet? N N  In the past six months, have you accidently leaked urine? N N  Do you have problems with loss of bowel control? N N  Managing your Medications? N N  Managing your Finances? N N  Housekeeping or managing your Housekeeping? N N    Patient Care Team: Mikey Kirschner, PA-C as PCP - General (Physician Assistant) Murlean Iba, MD (Nephrology) Abbie Sons, MD (Urology) Bryson Ha, OD (Optometry) Fabio Bering, DMD as Referring Physician (Dentistry)  Indicate any recent Medical Services you may have received from other than [facility] providers in the past year (date may be approximate).     Assessment:   This is a routine wellness examination for [place].  Hearing/Vision screen Hearing Screening - Comments:: No aids Vision Screening - Comments:: [facility]- [facility] Dr.Ritter  Dietary issues and exercise activities discussed: Current Exercise Habits: Home exercise routine, Type of exercise: walking, Time (Minutes): 60, Frequency (Times/Week): 3, Weekly Exercise (Minutes/Week): 180, Intensity: Mild   Goals Addressed             This Visit's Progress    DIET - EAT MORE FRUITS AND VEGETABLES         Depression Screen    05/17/2022   11:00 AM 12/08/2021    3:10 PM 11/25/2020    3:31 PM 04/26/2020   10:22 AM 04/22/2019    2:09 PM 10/17/2017    9:33 AM 10/12/2016    9:04 AM  PHQ 2/9 Scores  PHQ - 2 Score 0 0 0 0 0 0 0  PHQ- 9 Score 0 0 0    0    Fall Risk    05/17/2022   11:04  AM 05/13/2022   11:35 AM 12/08/2021    3:09 PM 11/25/2020    3:31 PM 04/26/2020   10:26 AM  Fall Risk    Falls in the past year? 0 0 0 0 0  Number falls in past yr: 0  0 0 0  Injury with Fall? 0  0 0 0  Risk for fall due to : No Fall Risks  No Fall Risks No Fall Risks   Follow up Falls prevention discussed;Falls evaluation completed  Falls evaluation completed Falls evaluation completed     FALL RISK PREVENTION PERTAINING TO THE HOME:  Any stairs in or around the home? No  If so, are there any without handrails? No  Home free of loose throw rugs in walkways, pet beds, electrical cords, etc? Yes  Adequate lighting in your home to reduce risk of falls? Yes   ASSISTIVE DEVICES UTILIZED TO PREVENT FALLS:  Life alert? No  Use of a cane, walker or w/c? No  Grab bars in the bathroom? Yes  Shower chair or bench in shower? Yes  Elevated toilet seat or a handicapped toilet? Yes   TIMED UP AND GO:  Was the test performed? Yes .  Length of time to ambulate 10 feet: 4 sec.   Gait steady and fast without use of assistive device  Cognitive Function:        05/17/2022   11:05 AM 04/26/2020   10:28 AM  6CIT Screen  What Year? 0 points 0 points  What month? 0 points 0 points  What time? 0 points 0 points  Count back from 20 0 points 0 points  Months in reverse 0 points 0 points  Repeat phrase 0 points 0 points  Total Score 0 points 0 points    Immunizations Immunization History  Administered Date(s) Administered   Fluad Quad(high Dose 65+) 04/01/2019, 04/22/2020, 04/27/2021   Influenza, High Dose Seasonal PF 04/17/2018   Influenza-Unspecified 04/08/2015   PFIZER Comirnaty(Gray Top)Covid-19 Tri-Sucrose Vaccine 03/04/2020, 10/04/2020   PFIZER(Purple Top)SARS-COV-2 Vaccination 08/06/2019, 08/27/2019   Pfizer Covid-19 Vaccine Bivalent Booster 65yr & up 04/29/2021, 12/21/2021   Pneumococcal Conjugate-13 10/12/2016   Pneumococcal Polysaccharide-23 10/17/2017   Tdap 09/04/2011, 12/21/2021    TDAP status: Up to date  Flu Vaccine status: Up to date  Pneumococcal vaccine status:  Up to date  Covid-19 vaccine status: Completed vaccines  Qualifies for Shingles Vaccine? Yes   Zostavax completed No   Shingrix Completed?: No.    Education has been provided regarding the importance of this vaccine. Patient has been advised to call insurance company to determine out of pocket expense if they have not yet received this vaccine. Advised may also receive vaccine at local pharmacy or Health Dept. Verbalized acceptance and understanding.  Screening Tests Health Maintenance  Topic Date Due   Zoster Vaccines- Shingrix (1 of 2) Never done   Diabetic kidney evaluation - GFR measurement  06/23/2019   OPHTHALMOLOGY EXAM  10/19/2020   HEMOGLOBIN A1C  02/13/2022   COVID-19 Vaccine (7 - Pfizer risk series) 02/15/2022   Diabetic kidney evaluation - Urine ACR  08/17/2022   FOOT EXAM  12/09/2022   Medicare Annual Wellness (AWV)  05/18/2023   MAMMOGRAM  05/31/2023   COLONOSCOPY (Pts 45-461yrInsurance coverage will need to be confirmed)  12/10/2025   TETANUS/TDAP  12/22/2031   Pneumonia Vaccine 6549Years old  Completed   INFLUENZA VACCINE  Completed   DEXA SCAN  Completed   Hepatitis C Screening  Completed  HPV VACCINES  Aged Out    Health Maintenance  Health Maintenance Due  Topic Date Due   Zoster Vaccines- Shingrix (1 of 2) Never done   Diabetic kidney evaluation - GFR measurement  06/23/2019   OPHTHALMOLOGY EXAM  10/19/2020   HEMOGLOBIN A1C  02/13/2022   COVID-19 Vaccine (7 - Pfizer risk series) 02/15/2022    Colorectal cancer screening: Type of screening: Colonoscopy. Completed 12/10/20. Repeat every 5 years  Mammogram status: Completed appt on 05/31/22. Repeat every year  Bone Density status: Completed 11/23/16. Results reflect: Bone density results: NORMAL. Repeat every 5 years. (Thinks had BDS before kidney transplant)  Lung Cancer Screening: (Low Dose CT Chest recommended if Age 33-80 years, 30 pack-year currently smoking OR have quit w/in 15years.) does not  qualify.    Additional Screening:  Hepatitis C Screening: does qualify; Completed 06/18/18  Vision Screening: Recommended annual ophthalmology exams for early detection of glaucoma and other disorders of the eye. Is the patient up to date with their annual eye exam?  Yes  Who is the provider or what is the name of the office in which the patient attends annual eye exams? Dr.Ritter If pt is not established with a provider, would they like to be referred to a provider to establish care? No .   Dental Screening: Recommended annual dental exams for proper oral hygiene  Community Resource Referral / Chronic Care Management: CRR required this visit?  No   CCM required this visit?  No      Plan:     I have personally reviewed and noted the following in the patient's chart:   Medical and social history Use of alcohol, tobacco or illicit drugs  Current medications and supplements including opioid prescriptions. Patient is not currently taking opioid prescriptions. Functional ability and status Nutritional status Physical activity Advanced directives List of other physicians Hospitalizations, surgeries, and ER visits in previous 12 months Vitals Screenings to include cognitive, depression, and falls Referrals and appointments  In addition, I have reviewed and discussed with patient certain preventive protocols, quality metrics, and best practice recommendations. A written personalized care plan for preventive services as well as general preventive health recommendations were provided to patient.     Dionisio David, LPN   [id number]   Nurse Notes: none

## 2022-05-30 DIAGNOSIS — D849 Immunodeficiency, unspecified: Secondary | ICD-10-CM | POA: Diagnosis not present

## 2022-05-30 DIAGNOSIS — Z940 Kidney transplant status: Secondary | ICD-10-CM | POA: Diagnosis not present

## 2022-05-31 ENCOUNTER — Ambulatory Visit
Admission: RE | Admit: 2022-05-31 | Discharge: 2022-05-31 | Disposition: A | Discharge status: Home / Self Care | Payer: Medicare Other | Source: Ambulatory Visit | Attending: Family Medicine | Admitting: Family Medicine

## 2022-05-31 DIAGNOSIS — Z1231 Encounter for screening mammogram for malignant neoplasm of breast: Secondary | ICD-10-CM | POA: Diagnosis not present

## 2022-06-21 DIAGNOSIS — D849 Immunodeficiency, unspecified: Secondary | ICD-10-CM | POA: Diagnosis not present

## 2022-06-21 DIAGNOSIS — Z87891 Personal history of nicotine dependence: Secondary | ICD-10-CM | POA: Diagnosis not present

## 2022-06-21 DIAGNOSIS — I10 Essential (primary) hypertension: Secondary | ICD-10-CM | POA: Diagnosis not present

## 2022-06-21 DIAGNOSIS — Z940 Kidney transplant status: Secondary | ICD-10-CM | POA: Diagnosis not present

## 2022-06-21 DIAGNOSIS — E1121 Type 2 diabetes mellitus with diabetic nephropathy: Secondary | ICD-10-CM | POA: Diagnosis not present

## 2022-06-21 DIAGNOSIS — Z7185 Encounter for immunization safety counseling: Secondary | ICD-10-CM | POA: Diagnosis not present

## 2022-06-21 DIAGNOSIS — D84821 Immunodeficiency due to drugs: Secondary | ICD-10-CM | POA: Diagnosis not present

## 2022-06-21 DIAGNOSIS — Z794 Long term (current) use of insulin: Secondary | ICD-10-CM | POA: Diagnosis not present

## 2022-06-21 DIAGNOSIS — E785 Hyperlipidemia, unspecified: Secondary | ICD-10-CM | POA: Diagnosis not present

## 2022-06-21 DIAGNOSIS — Z7984 Long term (current) use of oral hypoglycemic drugs: Secondary | ICD-10-CM | POA: Diagnosis not present

## 2022-06-21 DIAGNOSIS — Z4822 Encounter for aftercare following kidney transplant: Secondary | ICD-10-CM | POA: Diagnosis not present

## 2022-07-25 ENCOUNTER — Ambulatory Visit (LOCAL_COMMUNITY_HEALTH_CENTER): Payer: Medicare Other

## 2022-07-25 DIAGNOSIS — Z23 Encounter for immunization: Secondary | ICD-10-CM

## 2022-07-25 DIAGNOSIS — Z719 Counseling, unspecified: Secondary | ICD-10-CM

## 2022-07-25 NOTE — Progress Notes (Signed)
  Are you feeling sick today? No   Have you ever received a dose of COVID-19 Vaccine? [employer], [place], [place], [place], Other) Yes  If yes, which vaccine and how many doses?   6 doses Pfizer   Did you bring the vaccination record card or other documentation?  Yes   Do you have a health condition or are undergoing treatment that makes you moderately or severely immunocompromised? This would include, but not be limited to: cancer, HIV, organ transplant, immunosuppressive therapy/high-dose corticosteroids, or moderate/severe primary immunodeficiency.  Yes  Have you received COVID-19 vaccine before or during hematopoietic cell transplant (HCT) or CAR-T-cell therapies? No  Have you ever had an allergic reaction to: (This would include a severe allergic reaction or a reaction that caused hives, swelling, or respiratory distress, including wheezing.) A component of a COVID-19 vaccine or a previous dose of COVID-19 vaccine? No   Have you ever had an allergic reaction to another vaccine (other thanCOVID-19 vaccine) or an injectable medication? (This would include a severe allergic reaction or a reaction that caused hives, swelling, or respiratory distress, including wheezing.)   No    Do you have a history of any of the following:  Myocarditis or Pericarditis No  Dermal fillers:  No  Multisystem Inflammatory Syndrome (MIS-C or MIS-A)? No  COVID-19 disease within the past 3 months? No  Vaccinated with monkeypox vaccine in the last 4 weeks? No  Patient seen in nurse clinic for vaccinations.  Patient wants COVID and Pneumococcal vaccines. Patient is not eligible for Pneumococcal until 5 years from [place] which will be 10/18/2022.  Patient will make appointment for pneumococcal in April. VIS for COVID vaccine provided. Comirnaty 2023-24 vaccine IM in left deltoid. Tolerated well. COVID card updated and [facility] updated and copy provided. Waited 15 minutes.

## 2022-08-02 DIAGNOSIS — Z940 Kidney transplant status: Secondary | ICD-10-CM | POA: Diagnosis not present

## 2022-08-02 DIAGNOSIS — D849 Immunodeficiency, unspecified: Secondary | ICD-10-CM | POA: Diagnosis not present

## 2022-09-13 DIAGNOSIS — Z940 Kidney transplant status: Secondary | ICD-10-CM | POA: Diagnosis not present

## 2022-09-13 DIAGNOSIS — D849 Immunodeficiency, unspecified: Secondary | ICD-10-CM | POA: Diagnosis not present

## 2022-09-21 DIAGNOSIS — Z940 Kidney transplant status: Secondary | ICD-10-CM | POA: Diagnosis not present

## 2022-09-21 DIAGNOSIS — Z79899 Other long term (current) drug therapy: Secondary | ICD-10-CM | POA: Diagnosis not present

## 2022-09-21 DIAGNOSIS — E892 Postprocedural hypoparathyroidism: Secondary | ICD-10-CM | POA: Diagnosis not present

## 2022-10-25 DIAGNOSIS — Z940 Kidney transplant status: Secondary | ICD-10-CM | POA: Diagnosis not present

## 2022-10-25 DIAGNOSIS — D849 Immunodeficiency, unspecified: Secondary | ICD-10-CM | POA: Diagnosis not present

## 2022-10-30 ENCOUNTER — Ambulatory Visit (LOCAL_COMMUNITY_HEALTH_CENTER): Payer: Medicare Other

## 2022-10-30 DIAGNOSIS — Z23 Encounter for immunization: Secondary | ICD-10-CM | POA: Diagnosis not present

## 2022-10-30 DIAGNOSIS — Z719 Counseling, unspecified: Secondary | ICD-10-CM

## 2022-10-30 NOTE — Progress Notes (Signed)
Client seen in nurse clinic for PCV 20 vaccine.  Vaccine administered  Right Deltoid tolerated well.   Darion Milewski Sherrilyn Rist, RN

## 2022-11-22 ENCOUNTER — Other Ambulatory Visit: Payer: Self-pay | Admitting: Family Medicine

## 2022-11-22 DIAGNOSIS — E7800 Pure hypercholesterolemia, unspecified: Secondary | ICD-10-CM

## 2022-12-06 DIAGNOSIS — Z940 Kidney transplant status: Secondary | ICD-10-CM | POA: Diagnosis not present

## 2022-12-06 DIAGNOSIS — D849 Immunodeficiency, unspecified: Secondary | ICD-10-CM | POA: Diagnosis not present

## 2022-12-06 LAB — MICROALBUMIN / CREATININE URINE RATIO: Microalb Creat Ratio: 172

## 2022-12-06 LAB — PROTEIN / CREATININE RATIO, URINE
Albumin, U: 32.7
Creatinine, Urine: 190.3

## 2022-12-11 NOTE — Progress Notes (Unsigned)
I,Debra Paul,acting as a Neurosurgeon for [employer], PA-C.,have documented all relevant documentation on the behalf of Debra Ferguson, PA-C,as directed by  Debra Ferguson, PA-C while in the presence of Debra Ferguson, PA-C.    Complete physical exam   Patient: Debra Paul   DOB: [birth year]   72 y.o. Female  MRN: [id number] Visit Date: 12/12/2022  Today's healthcare provider: [name], PA-C   No chief complaint on file.  Subjective    Debra Paul is a 72 y.o. female who presents today for a complete physical exam.  She reports consuming a {diet types:17450} diet. {Exercise:19826} She generally feels {well/fairly well/poorly:18703}. She reports sleeping {well/fairly well/poorly:18703}. She {does/does not:200015} have additional problems to discuss today.  HPI  ***  Past Medical History:  Diagnosis Date   Anemia    Diabetes mellitus without complication (HCC)    GERD (gastroesophageal reflux disease)    Guaiac positive stools    History of renal calculi    Hypercholesteremia    Hypertension    Kidney stones    Takes lisinopril for kidney protection   Kidney stones    Parathyroid disorder (HCC)    Sleep apnea    does not use CPAP   Vertigo    in past   Past Surgical History:  Procedure Laterality Date   CATARACT EXTRACTION W/PHACO Left 05/14/2017   Procedure: CATARACT EXTRACTION PHACO AND INTRAOCULAR LENS PLACEMENT (IOC) LEFT DIABETIC;  Surgeon: Debra Mola, MD;  Location: [facility];  Service: Ophthalmology;  Laterality: Left;  diabetic   COLONOSCOPY     2003,2007,2015,2017   COLONOSCOPY WITH PROPOFOL N/A 12/10/2020   Procedure: COLONOSCOPY WITH PROPOFOL;  Surgeon: Debra Bill, MD;  Location: [facility];  Service: Endoscopy;  Laterality: N/A;  DM   CYSTOSCOPY WITH URETEROSCOPY AND STENT PLACEMENT  09/29/2015   Dr. Lonna Paul   DIALYSIS/PERMA CATHETER INSERTION Left 06/21/2018   Procedure: DIALYSIS/PERMA CATHETER  INSERTION;  Surgeon: Debra Dills, MD;  Location: [facility];  Service: Cardiovascular;  Laterality: Left;   DIALYSIS/PERMA CATHETER REMOVAL N/A 07/18/2018   Procedure: DIALYSIS/PERMA CATHETER REMOVAL;  Surgeon: Debra Needy, MD;  Location: [facility];  Service: Cardiovascular;  Laterality: N/A;   ESOPHAGOGASTRODUODENOSCOPY ENDOSCOPY     LITHOTRIPSY  2002   PARATHYROIDECTOMY  1990   Social History   Socioeconomic History   Marital status: Single    Spouse name: Not on file   Number of children: 1   Years of education: Not on file   Highest education level: Some college, no degree  Occupational History   Occupation: retired  Tobacco Use   Smoking status: Never   Smokeless tobacco: Never  Vaping Use   Vaping Use: Never used  Substance and Sexual Activity   Alcohol use: No    Comment: 2-3 times a year   Drug use: No   Sexual activity: Not Currently  Other Topics Concern   Not on file  Social History Narrative   Was legally separated at time of husbands death.   Social Determinants of Health   Financial Resource Strain: Low Risk  (12/08/2022)   Overall Financial Resource Strain (CARDIA)    Difficulty of Paying Living Expenses: Not hard at all  Food Insecurity: No Food Insecurity (12/08/2022)   Hunger Debra Paul Sign    Worried About Running Out of Food in the Last Year: Never true    Ran Out of Food in the Last Year: Never true  Transportation Needs:  No Transportation Needs (12/08/2022)   PRAPARE - Administrator, Civil Service (Medical): No    Lack of Transportation (Non-Medical): No  Physical Activity: Sufficiently Active (12/08/2022)   Exercise Debra Paul Sign    Days of Exercise per Week: 4 days    Minutes of Exercise per Session: 60 min  Stress: No Stress Concern Present (12/08/2022)   [employer] of Occupational Health - Occupational Stress Questionnaire    Feeling of Stress : Not at all  Social Connections: Moderately Integrated (12/08/2022)    Social Connection and Isolation Panel [NHANES]    Frequency of Communication with Friends and Family: More than three times a week    Frequency of Social Gatherings with Friends and Family: More than three times a week    Attends Religious Services: More than 4 times per year    Active Member of [employer] or Organizations: Yes    Attends Banker Meetings: More than 4 times per year    Marital Status: Widowed  Intimate Partner Violence: Not At Risk (05/17/2022)   Humiliation, Afraid, Rape, and Kick questionnaire    Fear of Current or Ex-Partner: No    Emotionally Abused: No    Physically Abused: No    Sexually Abused: No   Family Status  Relation Name Status   Mother  Alive   Father  Deceased at age 66s   Sister  Alive   Brother  Alive   Brother  Alive   Son  Alive   MGM  Alive   Neg Hx  (Not Specified)   Family History  Problem Relation Age of Onset   Heart disease Mother    Atrial fibrillation Mother    Colon cancer Father    Coronary artery disease Maternal Grandmother    Breast cancer Neg Hx    Allergies  Allergen Reactions   Prilosec [Omeprazole] Other (See Comments)    Reaction: unknown   Ciprofloxacin Rash    Patient Care Team: Bacigalupo, Marzella Schlein, MD as PCP - General (Family Medicine) Mosetta Pigeon, MD (Nephrology) Riki Altes, MD (Urology) Carrie Mew, OD (Optometry) Janne Napoleon, DMD as Referring Physician (Dentistry)   Medications: Outpatient Medications Prior to Visit  Medication Sig   acetaminophen (TYLENOL) 325 MG tablet Take 2 tablets (650 mg total) by mouth every 6 (six) hours as needed for mild pain (or Fever >/= 101).   Biotin 1000 MCG tablet Take 1,000 mcg by mouth daily.   Calcium Carbonate Antacid (TUMS PO) 750 mg in the morning, at noon, and at bedtime.   Cholecalciferol (VITAMIN D3) 50 MCG (2000 UT) capsule Take 1,000 Units by mouth daily.   empagliflozin (JARDIANCE) 10 MG TABS tablet Take 1 tablet by mouth  daily.   ezetimibe (ZETIA) 10 MG tablet Take 1 tablet by mouth as directed.   glipiZIDE (GLUCOTROL) 5 MG tablet in the morning and at bedtime.   Multiple Vitamin (MULTI-VITAMIN) tablet Take 1 tablet by mouth daily. CENTRUM SILVER   mycophenolate (CELLCEPT) 250 MG capsule in the morning, at noon, in the evening, and at bedtime.   OVER THE COUNTER MEDICATION Magnesium Plus Protein 133mg  2 tablets at 1pm and 5pm   PANTOPRAZOLE SODIUM PO Take 40 mg by mouth daily in the afternoon.   pravastatin (PRAVACHOL) 20 MG tablet TAKE 1 TABLET BY MOUTH AT  BEDTIME   predniSONE (DELTASONE) 5 MG tablet Take by mouth.   sodium bicarbonate 650 MG tablet Take 1,300 mg by mouth 2 (  two) times daily.    Sulfamethoxazole-Trimethoprim (BACTRIM DS PO) One tablet Mon. Wed., and Friday (Patient not taking: Reported on 05/17/2022)   tacrolimus (PROGRAF) 1 MG capsule in the morning, at noon, and at bedtime.   No facility-administered medications prior to visit.    Review of Systems  {Labs  Heme  Chem  Endocrine  Serology  Results Review (optional):23779}  Objective    There were no vitals taken for this visit. {Show previous Inge Waldroup signs (optional):23777}   Physical Exam  ***  Last depression screening scores    05/17/2022   11:00 AM 12/08/2021    3:10 PM 11/25/2020    3:31 PM  PHQ 2/9 Scores  PHQ - 2 Score 0 0 0  PHQ- 9 Score 0 0 0   Last fall risk screening    05/17/2022   11:04 AM  Fall Risk   Falls in the past year? 0  Number falls in past yr: 0  Injury with Fall? 0  Risk for fall due to : No Fall Risks  Follow up Falls prevention discussed;Falls evaluation completed   Last Audit-C alcohol use screening    12/08/2022    5:00 PM  Alcohol Use Disorder Test (AUDIT)  1. How often do you have a drink containing alcohol? 0   A score of 3 or more in women, and 4 or more in men indicates increased risk for alcohol abuse, EXCEPT if all of the points are from question 1   No results found for  any visits on 12/12/22.  Assessment & Plan    Routine Health Maintenance and Physical Exam  Exercise Activities and Dietary recommendations  Goals      DIET - EAT MORE FRUITS AND VEGETABLES     Exercise 3x per week (30 min per time)     Recommend to start walking 3 days a week for at least 30 minutes at a time. New goals after transplant        Immunization History  Administered Date(s) Administered   COVID-19, mRNA, vaccine(Comirnaty)12 years and older 07/25/2022   Fluad Quad(high Dose 65+) 04/01/2019, 04/22/2020, 04/27/2021   Influenza, High Dose Seasonal PF 04/17/2018   Influenza-Unspecified 04/08/2015   PFIZER Comirnaty(Gray Top)Covid-19 Tri-Sucrose Vaccine 03/04/2020, 10/04/2020   PFIZER(Purple Top)SARS-COV-2 Vaccination 08/06/2019, 08/27/2019   PNEUMOCOCCAL CONJUGATE-20 10/30/2022   Pfizer Covid-19 Vaccine Bivalent Booster 82yrs & up 04/29/2021, 12/21/2021   Pneumococcal Conjugate-13 10/12/2016   Pneumococcal Polysaccharide-23 10/17/2017   Tdap 09/04/2011, 12/21/2021    Health Maintenance  Topic Date Due   Diabetic kidney evaluation - eGFR measurement  06/23/2019   OPHTHALMOLOGY EXAM  10/19/2020   HEMOGLOBIN A1C  02/13/2022   Diabetic kidney evaluation - Urine ACR  08/17/2022   COVID-19 Vaccine (8 - 2023-24 season) 09/19/2022   FOOT EXAM  12/09/2022   INFLUENZA VACCINE  02/01/2023   Medicare Annual Wellness (AWV)  05/18/2023   MAMMOGRAM  05/31/2024   Colonoscopy  12/10/2025   DTaP/Tdap/Td (3 - Td or Tdap) 12/22/2031   Pneumonia Vaccine 15+ Years old  Completed   DEXA SCAN  Completed   Hepatitis C Screening  Completed   Zoster Vaccines- Shingrix  Completed   HPV VACCINES  Aged Out    Discussed health benefits of physical activity, and encouraged her to engage in regular exercise appropriate for her age and condition.  ***  No follow-ups on file.     {provider attestation***:1}   Debra Ferguson, PA-C  [facility] [phone] (phone)  [phone] (fax)  [facility]

## 2022-12-12 ENCOUNTER — Ambulatory Visit (INDEPENDENT_AMBULATORY_CARE_PROVIDER_SITE_OTHER): Payer: Medicare Other | Admitting: Physician Assistant

## 2022-12-12 ENCOUNTER — Encounter: Payer: Self-pay | Admitting: Physician Assistant

## 2022-12-12 VITALS — BP 129/72 | HR 63 | Temp 98.1°F | Resp 13 | Ht 69.0 in | Wt 240.9 lb

## 2022-12-12 DIAGNOSIS — E1169 Type 2 diabetes mellitus with other specified complication: Secondary | ICD-10-CM

## 2022-12-12 DIAGNOSIS — N1832 Chronic kidney disease, stage 3b: Secondary | ICD-10-CM | POA: Diagnosis not present

## 2022-12-12 DIAGNOSIS — Z940 Kidney transplant status: Secondary | ICD-10-CM | POA: Diagnosis not present

## 2022-12-12 DIAGNOSIS — Z0000 Encounter for general adult medical examination without abnormal findings: Secondary | ICD-10-CM | POA: Diagnosis not present

## 2022-12-12 DIAGNOSIS — E1159 Type 2 diabetes mellitus with other circulatory complications: Secondary | ICD-10-CM

## 2022-12-12 DIAGNOSIS — E785 Hyperlipidemia, unspecified: Secondary | ICD-10-CM | POA: Diagnosis not present

## 2022-12-12 DIAGNOSIS — Z794 Long term (current) use of insulin: Secondary | ICD-10-CM

## 2022-12-12 DIAGNOSIS — I152 Hypertension secondary to endocrine disorders: Secondary | ICD-10-CM

## 2022-12-12 DIAGNOSIS — E1122 Type 2 diabetes mellitus with diabetic chronic kidney disease: Secondary | ICD-10-CM

## 2022-12-12 LAB — POCT GLYCOSYLATED HEMOGLOBIN (HGB A1C): Hemoglobin A1C: 8.2 % — AB (ref 4.0–5.6)

## 2022-12-12 NOTE — Assessment & Plan Note (Signed)
Reviewed cmp Stable S/p kidney transplant

## 2022-12-12 NOTE — Assessment & Plan Note (Addendum)
F/b endo  Could not find recent A1c -- poc today 8.2%. pt has f/u with endo this month  Managed with lantus 45 u qhs and glipizide 5 mg bid On statin Uacr utd. Foot exam completed today

## 2022-12-12 NOTE — Assessment & Plan Note (Signed)
On zetia Repeat fasting lipids/cmp

## 2022-12-12 NOTE — Assessment & Plan Note (Signed)
Well controlled  Not medicated  Reviewed cmp, and ordered fcmp

## 2022-12-13 LAB — COMPREHENSIVE METABOLIC PANEL
ALT: 63 IU/L — ABNORMAL HIGH (ref 0–32)
AST: 47 IU/L — ABNORMAL HIGH (ref 0–40)
Albumin/Globulin Ratio: 1.9
Albumin: 4.1 g/dL (ref 3.8–4.8)
Alkaline Phosphatase: 81 IU/L (ref 44–121)
BUN/Creatinine Ratio: 16 (ref 12–28)
BUN: 19 mg/dL (ref 8–27)
Bilirubin Total: 0.3 mg/dL (ref 0.0–1.2)
CO2: 22 mmol/L (ref 20–29)
Calcium: 8.9 mg/dL (ref 8.7–10.3)
Chloride: 100 mmol/L (ref 96–106)
Creatinine, Ser: 1.22 mg/dL — ABNORMAL HIGH (ref 0.57–1.00)
Globulin, Total: 2.2 g/dL (ref 1.5–4.5)
Glucose: 175 mg/dL — ABNORMAL HIGH (ref 70–99)
Potassium: 4.6 mmol/L (ref 3.5–5.2)
Sodium: 137 mmol/L (ref 134–144)
Total Protein: 6.3 g/dL (ref 6.0–8.5)
eGFR: 47 mL/min/{1.73_m2} — ABNORMAL LOW (ref >59)

## 2022-12-13 LAB — LIPID PANEL
Chol/HDL Ratio: 3 ratio (ref 0.0–4.4)
Cholesterol, Total: 107 mg/dL (ref 100–199)
HDL: 36 mg/dL — ABNORMAL LOW (ref >39)
LDL Chol Calc (NIH): 47 mg/dL (ref 0–99)
Triglycerides: 140 mg/dL (ref 0–149)
VLDL Cholesterol Cal: 24 mg/dL (ref 5–40)

## 2022-12-21 DIAGNOSIS — E1121 Type 2 diabetes mellitus with diabetic nephropathy: Secondary | ICD-10-CM | POA: Diagnosis not present

## 2022-12-21 DIAGNOSIS — Z794 Long term (current) use of insulin: Secondary | ICD-10-CM | POA: Diagnosis not present

## 2023-01-22 DIAGNOSIS — D84821 Immunodeficiency due to drugs: Secondary | ICD-10-CM | POA: Diagnosis not present

## 2023-01-22 DIAGNOSIS — Z9889 Other specified postprocedural states: Secondary | ICD-10-CM | POA: Diagnosis not present

## 2023-01-22 DIAGNOSIS — Z4822 Encounter for aftercare following kidney transplant: Secondary | ICD-10-CM | POA: Diagnosis not present

## 2023-01-22 DIAGNOSIS — R945 Abnormal results of liver function studies: Secondary | ICD-10-CM | POA: Diagnosis not present

## 2023-01-22 DIAGNOSIS — E119 Type 2 diabetes mellitus without complications: Secondary | ICD-10-CM | POA: Diagnosis not present

## 2023-01-22 DIAGNOSIS — Z87891 Personal history of nicotine dependence: Secondary | ICD-10-CM | POA: Diagnosis not present

## 2023-01-22 DIAGNOSIS — Z940 Kidney transplant status: Secondary | ICD-10-CM | POA: Diagnosis not present

## 2023-01-22 DIAGNOSIS — Z9089 Acquired absence of other organs: Secondary | ICD-10-CM | POA: Diagnosis not present

## 2023-01-22 DIAGNOSIS — Z8711 Personal history of peptic ulcer disease: Secondary | ICD-10-CM | POA: Diagnosis not present

## 2023-01-22 DIAGNOSIS — Z79899 Other long term (current) drug therapy: Secondary | ICD-10-CM | POA: Diagnosis not present

## 2023-01-22 DIAGNOSIS — Z79624 Long term (current) use of inhibitors of nucleotide synthesis: Secondary | ICD-10-CM | POA: Diagnosis not present

## 2023-01-22 DIAGNOSIS — Z7952 Long term (current) use of systemic steroids: Secondary | ICD-10-CM | POA: Diagnosis not present

## 2023-01-22 DIAGNOSIS — Z79621 Long term (current) use of calcineurin inhibitor: Secondary | ICD-10-CM | POA: Diagnosis not present

## 2023-01-25 DIAGNOSIS — H524 Presbyopia: Secondary | ICD-10-CM | POA: Diagnosis not present

## 2023-01-25 DIAGNOSIS — E119 Type 2 diabetes mellitus without complications: Secondary | ICD-10-CM | POA: Diagnosis not present

## 2023-01-25 DIAGNOSIS — H2511 Age-related nuclear cataract, right eye: Secondary | ICD-10-CM | POA: Diagnosis not present

## 2023-01-25 DIAGNOSIS — D3131 Benign neoplasm of right choroid: Secondary | ICD-10-CM | POA: Diagnosis not present

## 2023-02-23 DIAGNOSIS — L814 Other melanin hyperpigmentation: Secondary | ICD-10-CM | POA: Diagnosis not present

## 2023-02-23 DIAGNOSIS — L659 Nonscarring hair loss, unspecified: Secondary | ICD-10-CM | POA: Diagnosis not present

## 2023-02-23 DIAGNOSIS — D2260 Melanocytic nevi of unspecified upper limb, including shoulder: Secondary | ICD-10-CM | POA: Diagnosis not present

## 2023-02-23 DIAGNOSIS — D2270 Melanocytic nevi of unspecified lower limb, including hip: Secondary | ICD-10-CM | POA: Diagnosis not present

## 2023-02-23 DIAGNOSIS — D225 Melanocytic nevi of trunk: Secondary | ICD-10-CM | POA: Diagnosis not present

## 2023-02-23 DIAGNOSIS — D1801 Hemangioma of skin and subcutaneous tissue: Secondary | ICD-10-CM | POA: Diagnosis not present

## 2023-02-23 DIAGNOSIS — D849 Immunodeficiency, unspecified: Secondary | ICD-10-CM | POA: Diagnosis not present

## 2023-02-23 DIAGNOSIS — L219 Seborrheic dermatitis, unspecified: Secondary | ICD-10-CM | POA: Diagnosis not present

## 2023-02-23 DIAGNOSIS — L821 Other seborrheic keratosis: Secondary | ICD-10-CM | POA: Diagnosis not present

## 2023-03-09 DIAGNOSIS — Z940 Kidney transplant status: Secondary | ICD-10-CM | POA: Diagnosis not present

## 2023-03-14 ENCOUNTER — Other Ambulatory Visit: Payer: Self-pay | Admitting: Family Medicine

## 2023-03-14 DIAGNOSIS — Z1231 Encounter for screening mammogram for malignant neoplasm of breast: Secondary | ICD-10-CM

## 2023-03-15 ENCOUNTER — Ambulatory Visit: Payer: Medicare Other

## 2023-03-15 DIAGNOSIS — Z23 Encounter for immunization: Secondary | ICD-10-CM

## 2023-03-15 DIAGNOSIS — Z719 Counseling, unspecified: Secondary | ICD-10-CM

## 2023-03-15 NOTE — Progress Notes (Signed)
In nurse clinic for immunizations. Patient requesting Covid (Comirnaty 12+) and Fluzone High Dose. Voices no concerns. VIS reviewed and given to patient. Vaccines tolerated well; no issues noted. Patient monitored for 15 mins; no problems. [facility] updated and copy given to patient.   Abagail Kitchens, RN

## 2023-04-19 DIAGNOSIS — Z940 Kidney transplant status: Secondary | ICD-10-CM | POA: Diagnosis not present

## 2023-05-10 IMAGING — MG DIGITAL DIAGNOSTIC BILAT W/ TOMO W/ CAD
8 series · 8 of 24 positions shown · non-contrast
Comparison: Previous exam(s).

CLINICAL DATA: Two year follow-up of probably benign left breast
asymmetry.

EXAM:
DIGITAL DIAGNOSTIC BILATERAL MAMMOGRAM WITH TOMOSYNTHESIS AND CAD
TECHNIQUE: Bilateral digital diagnostic mammography and breast tomosynthesis
was performed. The images were evaluated with computer-aided
detection.

[R MLO synth-2D]
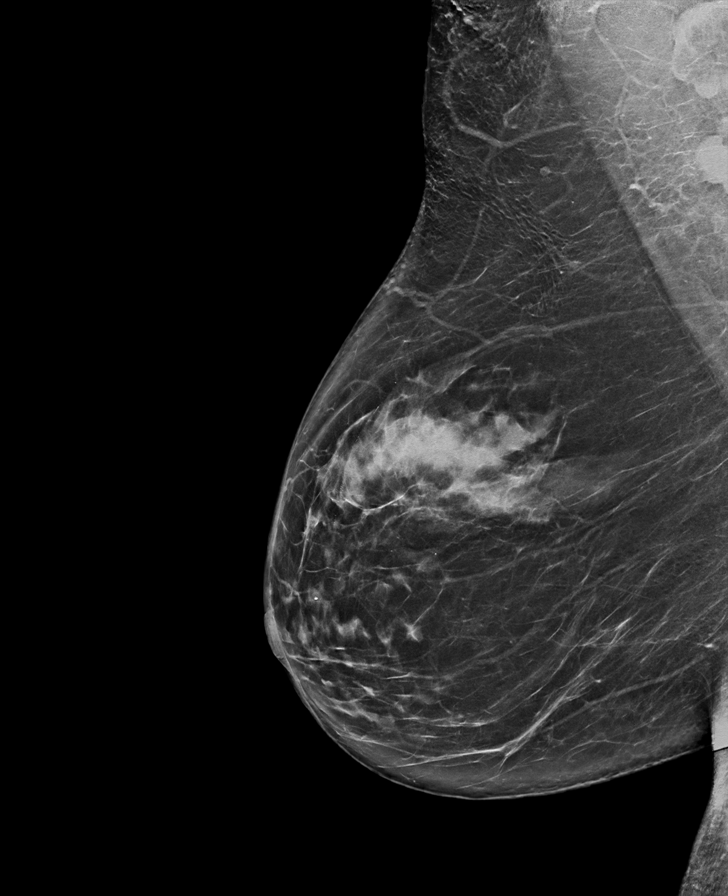

[L MLO synth-2D]
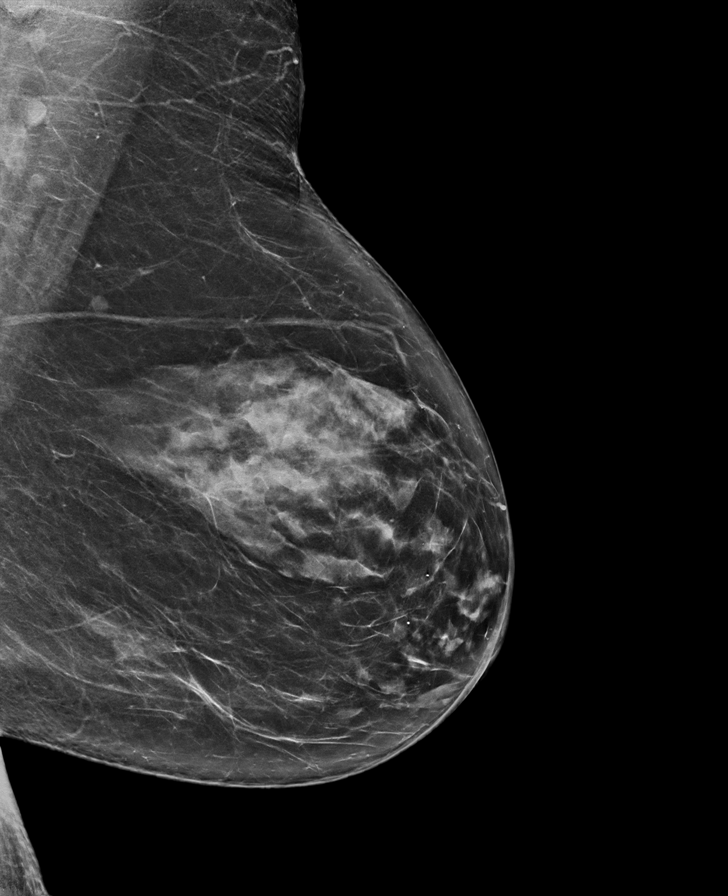

[L CC synth-2D]
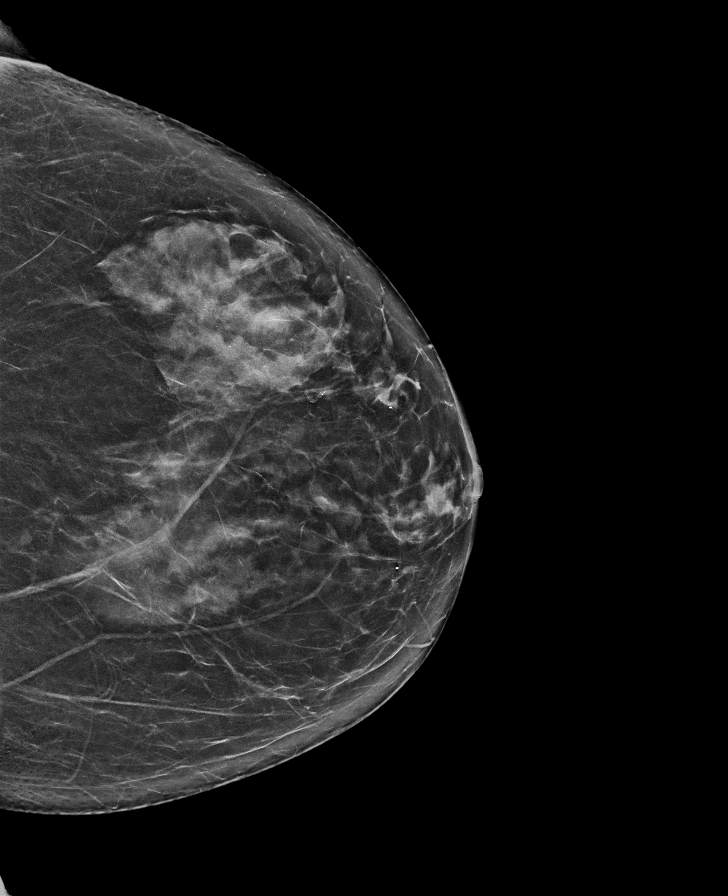

[R CC synth-2D]
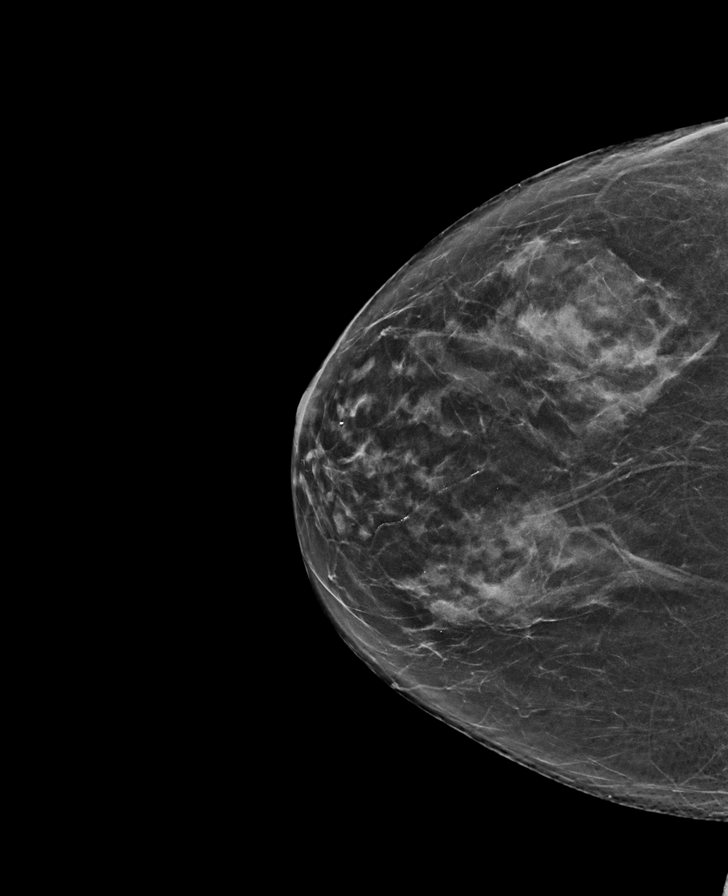

[R MLO tomo · tomo slice 41/80.0]
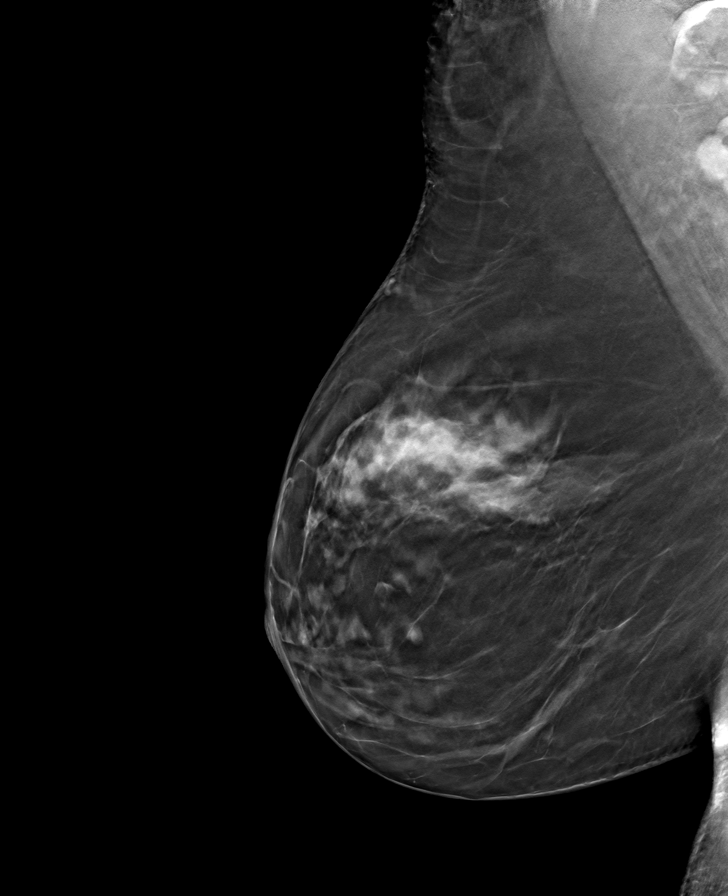

[L CC tomo · tomo slice 37/73.0]
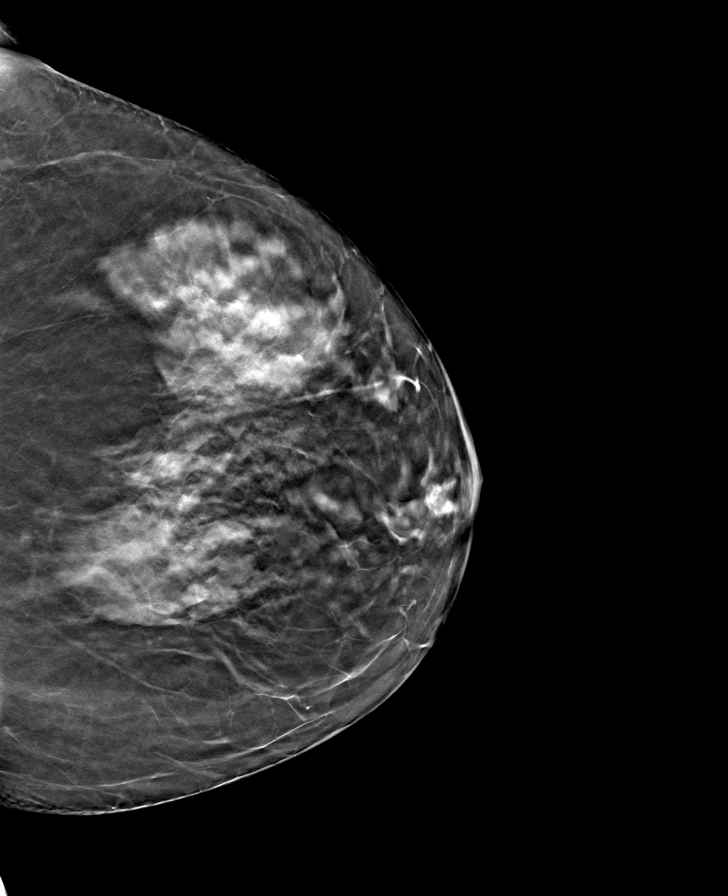

[R CC tomo · tomo slice 33/66.0]
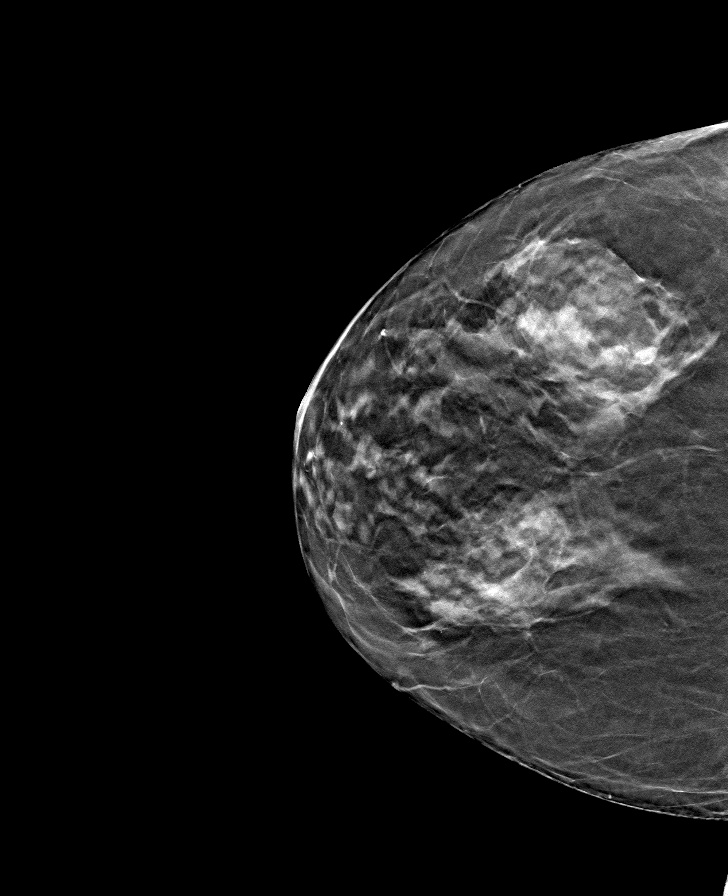

[L MLO tomo · tomo slice 42/83.0]
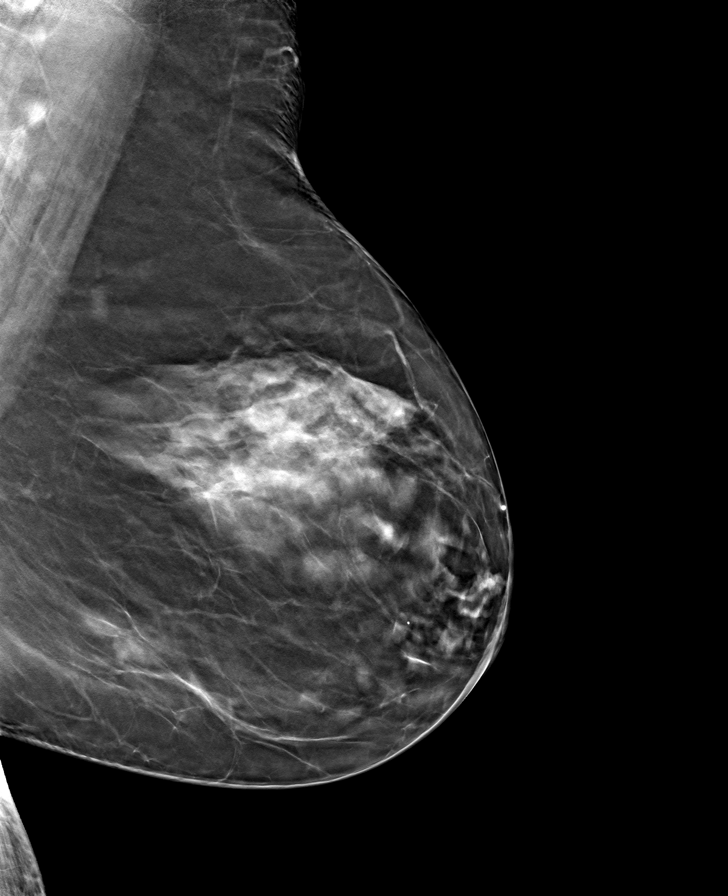

[8 of 24 positions shown; findings below may reference images not displayed]

ACR Breast Density Category c: The breast tissue is heterogeneously
dense, which may obscure small masses.
FINDINGS: Mammographically, there are no new suspicious masses, areas of
architectural distortion or microcalcifications in either breast.
The focal asymmetry in the lower slightly outer left breast, middle
to posterior depth, is stable and therefore considered benign.
IMPRESSION: No mammographic or sonographic evidence of breast malignancy.

RECOMMENDATION:
Screening mammogram in one year.(Code:KD-X-AGQ)

I have discussed the findings and recommendations with the patient.
If applicable, a reminder letter will be sent to the patient
regarding the next appointment.

BI-RADS CATEGORY  2: Benign.

## 2023-05-22 ENCOUNTER — Ambulatory Visit: Payer: Medicare Other

## 2023-05-22 VITALS — BP 134/74 | Ht 69.0 in | Wt 224.2 lb

## 2023-05-22 DIAGNOSIS — Z0000 Encounter for general adult medical examination without abnormal findings: Secondary | ICD-10-CM | POA: Diagnosis not present

## 2023-05-22 DIAGNOSIS — Z780 Asymptomatic menopausal state: Secondary | ICD-10-CM | POA: Diagnosis not present

## 2023-05-22 NOTE — Progress Notes (Signed)
Subjective:   Debra Paul is a 72 y.o. female who presents for Medicare Annual (Subsequent) preventive examination.  Visit Complete: In person  Patient Medicare AWV questionnaire was completed by the patient on 05/18/23; I have confirmed that all information answered by patient is correct and no changes since this date.  Cardiac Risk Factors include: advanced age (>73men, >29 women);diabetes mellitus;dyslipidemia;hypertension;obesity (BMI >30kg/m2)     Objective:    Today's Vitals   05/22/23 1059  BP: 134/74  Weight: 224 lb 3.2 oz (101.7 kg)  Height: 5\' 9"  (1.753 m)   Body mass index is 33.11 kg/m.     05/22/2023   11:12 AM 05/17/2022   11:03 AM 12/10/2020    8:41 AM 04/26/2020   10:26 AM 04/22/2019    2:08 PM 07/23/2018    3:26 PM 06/21/2018    9:52 AM  Advanced Directives  Does Patient Have a Medical Advance Directive? No No No No No No No  Would patient like information on creating a medical advance directive? No - Patient declined No - Patient declined  No - Patient declined No - Patient declined No - Patient declined No - Patient declined    Current Medications (verified) Outpatient Encounter Medications as of 05/22/2023  Medication Sig   acetaminophen (TYLENOL) 325 MG tablet Take 2 tablets (650 mg total) by mouth every 6 (six) hours as needed for mild pain (or Fever >/= 101).   Biotin 1000 MCG tablet Take 1,000 mcg by mouth daily.   Calcium Carbonate Antacid (TUMS PO) 750 mg in the morning, at noon, and at bedtime.   Cholecalciferol (VITAMIN D3) 50 MCG (2000 UT) capsule Take 1,000 Units by mouth daily.   ezetimibe (ZETIA) 10 MG tablet Take 1 tablet by mouth as directed.   glipiZIDE (GLUCOTROL) 5 MG tablet in the morning and at bedtime.   LANTUS SOLOSTAR 100 UNIT/ML Solostar Pen Inject 45 Units into the skin at bedtime.   Multiple Vitamin (MULTI-VITAMIN) tablet Take 1 tablet by mouth daily. CENTRUM SILVER   mycophenolate (CELLCEPT) 250 MG capsule in the  morning, at noon, in the evening, and at bedtime.   OVER THE COUNTER MEDICATION Magnesium Plus Protein 133mg  2 tablets at 1pm and 5pm   PANTOPRAZOLE SODIUM PO Take 40 mg by mouth daily in the afternoon.   pravastatin (PRAVACHOL) 20 MG tablet TAKE 1 TABLET BY MOUTH AT  BEDTIME   predniSONE (DELTASONE) 5 MG tablet Take by mouth.   sodium bicarbonate 650 MG tablet Take 1,300 mg by mouth 2 (two) times daily.    tacrolimus (PROGRAF) 1 MG capsule in the morning, at noon, and at bedtime.   No facility-administered encounter medications on file as of 05/22/2023.    Allergies (verified) Mixed vespid venom and Ciprofloxacin   History: Past Medical History:  Diagnosis Date   Anemia    Diabetes mellitus without complication (HCC)    GERD (gastroesophageal reflux disease)    Guaiac positive stools    History of renal calculi    Hypercholesteremia    Hypertension    Kidney stones    Takes lisinopril for kidney protection   Kidney stones    Parathyroid disorder (HCC)    Sleep apnea    does not use CPAP   Vertigo    in past   Past Surgical History:  Procedure Laterality Date   CATARACT EXTRACTION W/PHACO Left 05/14/2017   Procedure: CATARACT EXTRACTION PHACO AND INTRAOCULAR LENS PLACEMENT (IOC) LEFT DIABETIC;  Surgeon: Lockie Mola, MD;  Location: [facility];  Service: Ophthalmology;  Laterality: Left;  diabetic   COLONOSCOPY     2003,2007,2015,2017   COLONOSCOPY WITH PROPOFOL N/A 12/10/2020   Procedure: COLONOSCOPY WITH PROPOFOL;  Surgeon: Regis Bill, MD;  Location: [facility];  Service: Endoscopy;  Laterality: N/A;  DM   CYSTOSCOPY WITH URETEROSCOPY AND STENT PLACEMENT  09/29/2015   Dr. Lonna Cobb   DIALYSIS/PERMA CATHETER INSERTION Left 06/21/2018   Procedure: DIALYSIS/PERMA CATHETER INSERTION;  Surgeon: Renford Dills, MD;  Location: [facility];  Service: Cardiovascular;  Laterality: Left;   DIALYSIS/PERMA CATHETER REMOVAL N/A 07/18/2018    Procedure: DIALYSIS/PERMA CATHETER REMOVAL;  Surgeon: Annice Needy, MD;  Location: [facility];  Service: Cardiovascular;  Laterality: N/A;   ESOPHAGOGASTRODUODENOSCOPY ENDOSCOPY     LITHOTRIPSY  2002   PARATHYROIDECTOMY  1990   Family History  Problem Relation Age of Onset   Heart disease Mother    Atrial fibrillation Mother    Colon cancer Father    Coronary artery disease Maternal Grandmother    Breast cancer Neg Hx    Social History   Socioeconomic History   Marital status: Single    Spouse name: Not on file   Number of children: 1   Years of education: Not on file   Highest education level: Some college, no degree  Occupational History   Occupation: retired  Tobacco Use   Smoking status: Never   Smokeless tobacco: Never  Vaping Use   Vaping status: Never Used  Substance and Sexual Activity   Alcohol use: No    Comment: 2-3 times a year   Drug use: No   Sexual activity: Not Currently  Other Topics Concern   Not on file  Social History Narrative   Was legally separated at time of husbands death.   Social Determinants of Health   Financial Resource Strain: Low Risk  (05/22/2023)   Overall Financial Resource Strain (CARDIA)    Difficulty of Paying Living Expenses: Not hard at all  Food Insecurity: No Food Insecurity (05/22/2023)   Hunger Vital Sign    Worried About Running Out of Food in the Last Year: Never true    Ran Out of Food in the Last Year: Never true  Transportation Needs: No Transportation Needs (05/22/2023)   PRAPARE - Administrator, Civil Service (Medical): No    Lack of Transportation (Non-Medical): No  Physical Activity: Sufficiently Active (05/22/2023)   Exercise Vital Sign    Days of Exercise per Week: 4 days    Minutes of Exercise per Session: 60 min  Stress: No Stress Concern Present (05/22/2023)   [employer] of Occupational Health - Occupational Stress Questionnaire    Feeling of Stress : Not at all  Social  Connections: Moderately Integrated (05/22/2023)   Social Connection and Isolation Panel [NHANES]    Frequency of Communication with Friends and Family: More than three times a week    Frequency of Social Gatherings with Friends and Family: More than three times a week    Attends Religious Services: More than 4 times per year    Active Member of [employer] or Organizations: Yes    Attends Banker Meetings: More than 4 times per year    Marital Status: Widowed    Tobacco Counseling Counseling given: Not Answered   Clinical Intake:  Pre-visit preparation completed: Yes  Pain : No/denies pain     BMI - recorded: 33.11 Nutritional Status: BMI > 30  Obese Nutritional Risks: None Diabetes: Yes CBG done?: No Did pt. bring in CBG monitor from home?: No  How often do you need to have someone help you when you read instructions, pamphlets, or other written materials from your doctor or pharmacy?: 1 - Never  Interpreter Needed?: No  Information entered by :: Kennedy Bucker, LPN   Activities of Daily Living    05/22/2023   11:13 AM 05/18/2023    2:51 PM  In your present state of health, do you have any difficulty performing the following activities:  Hearing? 0 0  Vision? 0 0  Difficulty concentrating or making decisions? 0 0  Walking or climbing stairs? 0 0  Dressing or bathing? 0 0  Doing errands, shopping? 0 0  Preparing Food and eating ? N N  Using the Toilet? N N  In the past six months, have you accidently leaked urine? N N  Do you have problems with loss of bowel control? N N  Managing your Medications? N N  Managing your Finances? N N  Housekeeping or managing your Housekeeping? N N    Patient Care Team: Erasmo Downer, MD as PCP - General (Family Medicine) Mosetta Pigeon, MD (Nephrology) Riki Altes, MD (Urology) Carrie Mew, OD (Optometry) Janne Napoleon, DMD as Referring Physician (Dentistry)  Indicate any recent Medical  Services you may have received from other than [facility] providers in the past year (date may be approximate).     Assessment:   This is a routine wellness examination for [place].  Hearing/Vision screen Hearing Screening - Comments:: No aids Vision Screening - Comments:: Readers- Dr.Ritter    Goals Addressed             This Visit's Progress    DIET - INCREASE WATER INTAKE         Depression Screen    05/22/2023   11:10 AM 05/17/2022   11:00 AM 12/08/2021    3:10 PM 11/25/2020    3:31 PM 04/26/2020   10:22 AM 04/22/2019    2:09 PM 10/17/2017    9:33 AM  PHQ 2/9 Scores  PHQ - 2 Score 0 0 0 0 0 0 0  PHQ- 9 Score 0 0 0 0       Fall Risk    05/22/2023   11:12 AM 05/18/2023    2:51 PM 12/12/2022    8:33 AM 05/17/2022   11:04 AM 05/13/2022   11:35 AM  Fall Risk   Falls in the past year? 0 0 0 0 0  Number falls in past yr: 0 0 0 0   Injury with Fall? 0  0 0   Risk for fall due to : No Fall Risks  No Fall Risks No Fall Risks   Follow up Falls prevention discussed;Falls evaluation completed  Falls evaluation completed Falls prevention discussed;Falls evaluation completed     MEDICARE RISK AT HOME: Medicare Risk at Home Any stairs in or around the home?: Yes If so, are there any without handrails?: Yes Home free of loose throw rugs in walkways, pet beds, electrical cords, etc?: Yes Adequate lighting in your home to reduce risk of falls?: Yes Life alert?: No Use of a cane, walker or w/c?: No Grab bars in the bathroom?: No Shower chair or bench in shower?: Yes Elevated toilet seat or a handicapped toilet?: Yes  TIMED UP AND GO:  Was the test performed?  Yes  Length of time to ambulate 10 feet: 4 sec  Gait steady and fast without use of assistive device    Cognitive Function:        05/22/2023   11:19 AM 05/17/2022   11:05 AM 04/26/2020   10:28 AM  6CIT Screen  What Year? 0 points 0 points 0 points  What month? 0 points 0 points 0 points  What time? 0 points 0  points 0 points  Count back from 20 0 points 0 points 0 points  Months in reverse 0 points 0 points 0 points  Repeat phrase 0 points 0 points 0 points  Total Score 0 points 0 points 0 points    Immunizations Immunization History  Administered Date(s) Administered   Fluad Quad(high Dose 65+) 04/01/2019, 04/22/2020, 04/27/2021, 03/13/2022   Influenza, High Dose Seasonal PF 04/17/2018, 03/15/2023   Influenza-Unspecified 04/08/2015   PFIZER Comirnaty(Gray Top)Covid-19 Tri-Sucrose Vaccine 03/04/2020, 10/04/2020   PFIZER(Purple Top)SARS-COV-2 Vaccination 08/06/2019, 08/27/2019   PNEUMOCOCCAL CONJUGATE-20 10/30/2022   Pfizer Covid-19 Vaccine Bivalent Booster 62yrs & up 04/29/2021, 12/21/2021   Pfizer(Comirnaty)Fall Seasonal Vaccine 12 years and older 07/25/2022, 03/15/2023   Pneumococcal Conjugate-13 10/12/2016   Pneumococcal Polysaccharide-23 10/17/2017   Tdap 09/04/2011, 12/21/2021   Zoster Recombinant(Shingrix) 05/03/2022, 07/02/2022    TDAP status: Up to date  Flu Vaccine status: Up to date  Pneumococcal vaccine status: Up to date  Covid-19 vaccine status: Completed vaccines  Qualifies for Shingles Vaccine? Yes   Zostavax completed No   Shingrix Completed?: Yes  Screening Tests Health Maintenance  Topic Date Due   OPHTHALMOLOGY EXAM  10/19/2020   COVID-19 Vaccine (9 - 2023-24 season) 05/10/2023   MAMMOGRAM  06/01/2023   HEMOGLOBIN A1C  06/13/2023   Diabetic kidney evaluation - Urine ACR  12/06/2023   Diabetic kidney evaluation - eGFR measurement  12/12/2023   FOOT EXAM  12/12/2023   Medicare Annual Wellness (AWV)  05/21/2024   Colonoscopy  12/10/2025   DTaP/Tdap/Td (3 - Td or Tdap) 12/22/2031   Pneumonia Vaccine 58+ Years old  Completed   INFLUENZA VACCINE  Completed   DEXA SCAN  Completed   Hepatitis C Screening  Completed   Zoster Vaccines- Shingrix  Completed   HPV VACCINES  Aged Out    Health Maintenance  Health Maintenance Due  Topic Date Due    OPHTHALMOLOGY EXAM  10/19/2020   COVID-19 Vaccine (9 - 2023-24 season) 05/10/2023    Colorectal cancer screening: Type of screening: Colonoscopy. Completed 12/10/20. Repeat every 5 years  Mammogram status: Completed SCHEDULED 06/05/23. Repeat every year  Bone Density status: Ordered 11/23/16. Pt provided with contact info and advised to call to schedule appt.  Lung Cancer Screening: (Low Dose CT Chest recommended if Age 23-80 years, 20 pack-year currently smoking OR have quit w/in 15years.) does not qualify.   Additional Screening:  Hepatitis C Screening: does qualify; Completed 06/18/18  Vision Screening: Recommended annual ophthalmology exams for early detection of glaucoma and other disorders of the eye. Is the patient up to date with their annual eye exam?  Yes  Who is the provider or what is the name of the office in which the patient attends annual eye exams? Dr.Ritter If pt is not established with a provider, would they like to be referred to a provider to establish care? No .   Dental Screening: Recommended annual dental exams for proper oral hygiene  Diabetic Foot Exam: Diabetic Foot Exam: Completed 12/12/22  Community Resource Referral / Chronic Care Management: CRR required this visit?  No   CCM required this visit?  No  Plan:     I have personally reviewed and noted the following in the patient's chart:   Medical and social history Use of alcohol, tobacco or illicit drugs  Current medications and supplements including opioid prescriptions. Patient is not currently taking opioid prescriptions. Functional ability and status Nutritional status Physical activity Advanced directives List of other physicians Hospitalizations, surgeries, and ER visits in previous 12 months Vitals Screenings to include cognitive, depression, and falls Referrals and appointments  In addition, I have reviewed and discussed with patient certain preventive protocols, quality metrics,  and best practice recommendations. A written personalized care plan for preventive services as well as general preventive health recommendations were provided to patient.     Hal Hope, LPN   [id number]   After Visit Summary: (In Person-Declined) Patient declined AVS at this time.- on mychart  Nurse Notes: dexa ordered

## 2023-05-22 NOTE — Patient Instructions (Addendum)
Debra Paul , Thank you for taking time to come for your Medicare Wellness Visit. I appreciate your ongoing commitment to your health goals. Please review the following plan we discussed and let me know if I can assist you in the future.   Referrals/Orders/Follow-Ups/Clinician Recommendations: referral for bone density scan sent  You have an order for:  []   2D Mammogram  []   3D Mammogram  [x]   Bone Density     Please call for appointment:  [facility]  [address] [place] [address] [phone] [facility] [address] [place], [place] [address] [phone] [facility] [address] Geanie Logan [place], [place] [address] [phone]   Make sure to wear two-piece clothing.  No lotions, powders, or deodorants the day of the appointment. Make sure to bring picture ID and insurance card.  Bring list of medications you are currently taking including any supplements.   Schedule your [place] screening mammogram through MyChart!   Log into your MyChart account.  Go to 'Visit' (or 'Appointments' if on mobile App) --> Schedule an Appointment  Under 'Select a Reason for Visit' choose the Mammogram Screening option.  Complete the pre-visit questions and select the time and place that best fits your schedule.    This is a list of the screening recommended for you and due dates:  Health Maintenance  Topic Date Due   Eye exam for diabetics  10/19/2020   COVID-19 Vaccine (9 - 2023-24 season) 05/10/2023   Mammogram  06/01/2023   Hemoglobin A1C  06/13/2023   Yearly kidney health urinalysis for diabetes  12/06/2023   Yearly kidney function blood test for diabetes  12/12/2023   Complete foot exam   12/12/2023   Medicare Annual Wellness Visit  05/21/2024   Colon Cancer Screening  12/10/2025   DTaP/Tdap/Td vaccine (3 - Td or Tdap) 12/22/2031   Pneumonia  Vaccine  Completed   Flu Shot  Completed   DEXA scan (bone density measurement)  Completed   Hepatitis C Screening  Completed   Zoster (Shingles) Vaccine  Completed   HPV Vaccine  Aged Out    Advanced directives: (ACP Link)Information on Advanced Care Planning can be found at [facility] of [place] Advance Health Care Directives Advance Health Care Directives ([email or web address]   Next Medicare Annual Wellness Visit scheduled for next year: Yes   05/27/24 @ 10:50 am in person

## 2023-05-29 DIAGNOSIS — Z940 Kidney transplant status: Secondary | ICD-10-CM | POA: Diagnosis not present

## 2023-06-04 ENCOUNTER — Encounter: Payer: Self-pay | Admitting: Family Medicine

## 2023-06-04 NOTE — Telephone Encounter (Signed)
 Care team updated and letter sent for eye exam notes.

## 2023-06-05 ENCOUNTER — Ambulatory Visit
Admission: RE | Admit: 2023-06-05 | Discharge: 2023-06-05 | Disposition: A | Discharge status: Home / Self Care | Payer: Medicare Other | Source: Ambulatory Visit | Attending: Family Medicine | Admitting: Family Medicine

## 2023-06-05 DIAGNOSIS — Z1231 Encounter for screening mammogram for malignant neoplasm of breast: Secondary | ICD-10-CM | POA: Insufficient documentation

## 2023-06-25 DIAGNOSIS — R739 Hyperglycemia, unspecified: Secondary | ICD-10-CM | POA: Diagnosis not present

## 2023-06-25 DIAGNOSIS — E1121 Type 2 diabetes mellitus with diabetic nephropathy: Secondary | ICD-10-CM | POA: Diagnosis not present

## 2023-06-25 DIAGNOSIS — Z87891 Personal history of nicotine dependence: Secondary | ICD-10-CM | POA: Diagnosis not present

## 2023-06-25 DIAGNOSIS — Z7985 Long-term (current) use of injectable non-insulin antidiabetic drugs: Secondary | ICD-10-CM | POA: Diagnosis not present

## 2023-06-25 DIAGNOSIS — Z794 Long term (current) use of insulin: Secondary | ICD-10-CM | POA: Diagnosis not present

## 2023-06-25 DIAGNOSIS — E785 Hyperlipidemia, unspecified: Secondary | ICD-10-CM | POA: Diagnosis not present

## 2023-06-25 DIAGNOSIS — E1165 Type 2 diabetes mellitus with hyperglycemia: Secondary | ICD-10-CM | POA: Diagnosis not present

## 2023-06-28 ENCOUNTER — Ambulatory Visit
Admission: RE | Admit: 2023-06-28 | Discharge: 2023-06-28 | Disposition: A | Discharge status: Home / Self Care | Payer: Medicare Other | Source: Ambulatory Visit | Attending: Family Medicine | Admitting: Family Medicine

## 2023-06-28 DIAGNOSIS — Z0189 Encounter for other specified special examinations: Secondary | ICD-10-CM | POA: Diagnosis not present

## 2023-06-28 DIAGNOSIS — Z780 Asymptomatic menopausal state: Secondary | ICD-10-CM | POA: Insufficient documentation

## 2023-07-11 DIAGNOSIS — Z940 Kidney transplant status: Secondary | ICD-10-CM | POA: Diagnosis not present

## 2023-07-25 DIAGNOSIS — E559 Vitamin D deficiency, unspecified: Secondary | ICD-10-CM | POA: Diagnosis not present

## 2023-07-25 DIAGNOSIS — D849 Immunodeficiency, unspecified: Secondary | ICD-10-CM | POA: Diagnosis not present

## 2023-07-25 DIAGNOSIS — Z9089 Acquired absence of other organs: Secondary | ICD-10-CM | POA: Diagnosis not present

## 2023-07-25 DIAGNOSIS — Z9889 Other specified postprocedural states: Secondary | ICD-10-CM | POA: Diagnosis not present

## 2023-07-25 DIAGNOSIS — I10 Essential (primary) hypertension: Secondary | ICD-10-CM | POA: Diagnosis not present

## 2023-07-25 DIAGNOSIS — Z940 Kidney transplant status: Secondary | ICD-10-CM | POA: Diagnosis not present

## 2023-07-25 DIAGNOSIS — Z794 Long term (current) use of insulin: Secondary | ICD-10-CM | POA: Diagnosis not present

## 2023-07-25 DIAGNOSIS — E1121 Type 2 diabetes mellitus with diabetic nephropathy: Secondary | ICD-10-CM | POA: Diagnosis not present

## 2023-08-01 DIAGNOSIS — Z940 Kidney transplant status: Secondary | ICD-10-CM | POA: Diagnosis not present

## 2023-08-28 ENCOUNTER — Other Ambulatory Visit: Payer: Self-pay | Admitting: Family Medicine

## 2023-08-28 DIAGNOSIS — E7800 Pure hypercholesterolemia, unspecified: Secondary | ICD-10-CM

## 2023-08-29 NOTE — Telephone Encounter (Signed)
 Requested Prescriptions  Pending Prescriptions Disp Refills   pravastatin (PRAVACHOL) 20 MG tablet [Pharmacy Med Name: Pravastatin Sodium 20 MG Oral Tablet] 100 tablet 1    Sig: TAKE 1 TABLET BY MOUTH AT  BEDTIME     Cardiovascular:  Antilipid - Statins Failed - 08/29/2023  2:23 PM      Failed - Lipid Panel in normal range within the last 12 months    Cholesterol, Total  Date Value Ref Range Status  12/12/2022 107 100 - 199 mg/dL Final   LDL Chol Calc (NIH)  Date Value Ref Range Status  12/12/2022 47 0 - 99 mg/dL Final   HDL  Date Value Ref Range Status  12/12/2022 36 (L) >39 mg/dL Final   Triglycerides  Date Value Ref Range Status  12/12/2022 140 0 - 149 mg/dL Final         Passed - Patient is not pregnant      Passed - Valid encounter within last 12 months    Recent Outpatient Visits           8 months ago Encounter for annual physical exam   [place] [facility] [name], PA-C   1 year ago Encounter for annual physical exam   [place] [facility] [place], Marzella Schlein, MD   2 years ago Well woman exam   [facility] Chrismon, Jodell Cipro, PA-C   3 years ago Annual physical exam   [facility] [place], [place], [place]   3 years ago Type 2 diabetes mellitus without complication, without long-term current use of insulin [facility])   [place] [facility] [place], Alessandra [place], [place]       Future Appointments             In 3 months Bacigalupo, Marzella Schlein, MD [facility]

## 2023-10-08 DIAGNOSIS — Z794 Long term (current) use of insulin: Secondary | ICD-10-CM | POA: Diagnosis not present

## 2023-10-08 DIAGNOSIS — R739 Hyperglycemia, unspecified: Secondary | ICD-10-CM | POA: Diagnosis not present

## 2023-10-08 DIAGNOSIS — E1121 Type 2 diabetes mellitus with diabetic nephropathy: Secondary | ICD-10-CM | POA: Diagnosis not present

## 2023-10-08 DIAGNOSIS — I10 Essential (primary) hypertension: Secondary | ICD-10-CM | POA: Diagnosis not present

## 2023-11-06 DIAGNOSIS — Z940 Kidney transplant status: Secondary | ICD-10-CM | POA: Diagnosis not present

## 2023-12-14 ENCOUNTER — Encounter: Payer: Self-pay | Admitting: Family Medicine

## 2023-12-20 ENCOUNTER — Encounter: Payer: Self-pay | Admitting: Family Medicine

## 2023-12-20 ENCOUNTER — Ambulatory Visit (INDEPENDENT_AMBULATORY_CARE_PROVIDER_SITE_OTHER): Admitting: Family Medicine

## 2023-12-20 VITALS — BP 120/81 | HR 67 | Resp 12 | Wt 214.2 lb

## 2023-12-20 DIAGNOSIS — Z0000 Encounter for general adult medical examination without abnormal findings: Secondary | ICD-10-CM

## 2023-12-20 DIAGNOSIS — E1169 Type 2 diabetes mellitus with other specified complication: Secondary | ICD-10-CM

## 2023-12-20 DIAGNOSIS — I152 Hypertension secondary to endocrine disorders: Secondary | ICD-10-CM | POA: Diagnosis not present

## 2023-12-20 DIAGNOSIS — Z940 Kidney transplant status: Secondary | ICD-10-CM

## 2023-12-20 DIAGNOSIS — E1159 Type 2 diabetes mellitus with other circulatory complications: Secondary | ICD-10-CM

## 2023-12-20 DIAGNOSIS — E1122 Type 2 diabetes mellitus with diabetic chronic kidney disease: Secondary | ICD-10-CM

## 2023-12-20 DIAGNOSIS — Z1231 Encounter for screening mammogram for malignant neoplasm of breast: Secondary | ICD-10-CM | POA: Diagnosis not present

## 2023-12-20 DIAGNOSIS — E785 Hyperlipidemia, unspecified: Secondary | ICD-10-CM

## 2023-12-20 DIAGNOSIS — N1832 Chronic kidney disease, stage 3b: Secondary | ICD-10-CM | POA: Diagnosis not present

## 2023-12-20 DIAGNOSIS — Z0001 Encounter for general adult medical examination with abnormal findings: Secondary | ICD-10-CM | POA: Diagnosis not present

## 2023-12-20 NOTE — Assessment & Plan Note (Signed)
 Hyperlipidemia, last cholesterol check was over a year ago. She is on pravastatin  and Zetia, reports increased cheese consumption, and is curious about current cholesterol levels. - Order cholesterol test after July 7th. - Continue current medications: pravastatin  and Zetia.

## 2023-12-20 NOTE — Assessment & Plan Note (Signed)
 Chronic kidney disease stage 3. She is under regular follow-up with the transplant team at [facility] with no new symptoms reported. - Continue follow-up with the transplant team at [facility].

## 2023-12-20 NOTE — Progress Notes (Signed)
 Complete physical exam   Patient: Debra Paul   DOB: [birth year]   73 y.o. Female  MRN: [id number] Visit Date: 12/20/2023  Today's healthcare provider: Aden Agreste, MD   Chief Complaint  Patient presents with   Annual Exam    Last completed 12/12/22 Diet -  General, healthy Exercise - walking 4 times a week for one hour to hour and a half Feeling - well Sleeping - well Concerns -  none   Subjective    Debra Paul is a 73 y.o. female who presents today for a complete physical exam.   Discussed the use of AI scribe software for clinical note transcription with the patient, who gave verbal consent to proceed.  History of Present Illness   Debra Paul is a 73 year old female with type two diabetes, hyperlipidemia, hypertension, and CKD stage three who presents for an annual physical exam.  She manages type two diabetes with glipizide, Lantus, and Ozempic. Ozempic has improved her blood glucose levels, with a current 30-day average of 130 mg/dL. Her last A1c was 8.3% in April. She finds it challenging to lower her A1c due to prednisone use post-transplant.  Her cholesterol was last checked a year ago and was normal. She is interested in her current levels due to increased cheese consumption.  She is up to date with vaccinations, including shingles, pneumonia, tetanus, and COVID-19, with plans for a flu shot in the fall. She is current with her colonoscopy, with the next one due in 2027.  An eye exam is scheduled for July 30th. She is due for a mammogram in December. A bone density test last December showed hip deterioration, but she reports no pain and maintains regular physical activity.  She experiences neuropathy infrequently and has no symptoms such as itching or burning.        Last depression screening scores    05/22/2023   11:10 AM 05/17/2022   11:00 AM 12/08/2021    3:10 PM  PHQ 2/9 Scores  PHQ - 2 Score 0 0 0  PHQ- 9 Score 0 0  0   Last fall risk screening    05/22/2023   11:12 AM  Fall Risk   Falls in the past year? 0  Number falls in past yr: 0  Injury with Fall? 0  Risk for fall due to : No Fall Risks  Follow up Falls prevention discussed;Falls evaluation completed        Medications: Outpatient Medications Prior to Visit  Medication Sig   acetaminophen  (TYLENOL ) 325 MG tablet Take 2 tablets (650 mg total) by mouth every 6 (six) hours as needed for mild pain (or Fever >/= 101).   Biotin 1000 MCG tablet Take 1,000 mcg by mouth daily.   Calcium  Carbonate Antacid (TUMS PO) 750 mg in the morning, at noon, and at bedtime. (Patient taking differently: 750 mg daily at 12 noon.)   Cholecalciferol (VITAMIN D3) 50 MCG (2000 UT) capsule Take 1,000 Units by mouth daily.   ezetimibe (ZETIA) 10 MG tablet Take 1 tablet by mouth as directed.   famotidine  (PEPCID ) 20 MG tablet Take 20 mg by mouth daily.   glipiZIDE (GLUCOTROL) 5 MG tablet in the morning and at bedtime.   ketoconazole (NIZORAL) 2 % shampoo Apply 1 Application topically once.   LANTUS SOLOSTAR 100 UNIT/ML Solostar Pen Inject 45 Units into the skin at bedtime. (Patient taking differently: Inject 45 Units into the skin at bedtime.)   Multiple  Vitamin (MULTI-VITAMIN) tablet Take 1 tablet by mouth daily. CENTRUM SILVER   mycophenolate (CELLCEPT) 250 MG capsule in the morning, at noon, in the evening, and at bedtime. (Patient taking differently: 2 (two) times daily.)   OVER THE COUNTER MEDICATION Magnesium  Plus Protein 133mg  2 tablets at 1pm and 5pm   OZEMPIC, 0.25 OR 0.5 MG/DOSE, 2 MG/3ML SOPN Inject 0.5 mg into the skin.   PANTOPRAZOLE SODIUM PO Take 40 mg by mouth daily in the afternoon.   pravastatin  (PRAVACHOL ) 20 MG tablet TAKE 1 TABLET BY MOUTH AT  BEDTIME   predniSONE (DELTASONE) 5 MG tablet Take by mouth.   sodium bicarbonate  650 MG tablet Take 1,300 mg by mouth 2 (two) times daily.    tacrolimus  (PROGRAF ) 1 MG capsule in the morning, at noon,  and at bedtime.   No facility-administered medications prior to visit.    Review of Systems    Objective    BP 120/81 (BP Location: Left Arm, Patient Position: Sitting, Cuff Size: Large)   Pulse 67   Resp 12   Wt 214 lb 3.2 oz (97.2 kg)   BMI 31.63 kg/m    Physical Exam Vitals reviewed.  Constitutional:      General: She is not in acute distress.    Appearance: Normal appearance. She is well-developed. She is not diaphoretic.  HENT:     Head: Normocephalic and atraumatic.     Right Ear: Tympanic membrane, ear canal and external ear normal.     Left Ear: Tympanic membrane, ear canal and external ear normal.     Nose: Nose normal.     Mouth/Throat:     Mouth: Mucous membranes are moist.     Pharynx: Oropharynx is clear. No oropharyngeal exudate.   Eyes:     General: No scleral icterus.    Conjunctiva/sclera: Conjunctivae normal.     Pupils: Pupils are equal, round, and reactive to light.   Neck:     Thyroid : No thyromegaly.   Cardiovascular:     Rate and Rhythm: Normal rate and regular rhythm.     Heart sounds: Normal heart sounds. No murmur heard. Pulmonary:     Effort: Pulmonary effort is normal. No respiratory distress.     Breath sounds: Normal breath sounds. No wheezing or rales.  Abdominal:     General: There is no distension.     Palpations: Abdomen is soft.     Tenderness: There is no abdominal tenderness.   Musculoskeletal:        General: No deformity.     Cervical back: Neck supple.     Right lower leg: No edema.     Left lower leg: No edema.  Lymphadenopathy:     Cervical: No cervical adenopathy.   Skin:    General: Skin is warm and dry.     Findings: No rash.   Neurological:     Mental Status: She is alert and oriented to person, place, and time. Mental status is at baseline.     Gait: Gait normal.   Psychiatric:        Mood and Affect: Mood normal.        Behavior: Behavior normal.        Thought Content: Thought content normal.       No results found for any visits on 12/20/23.  Assessment & Plan    Routine Health Maintenance and Physical Exam  Exercise Activities and Dietary recommendations  Goals      DIET - EAT  MORE FRUITS AND VEGETABLES     DIET - INCREASE WATER INTAKE     Exercise 3x per week (30 min per time)     Recommend to start walking 3 days a week for at least 30 minutes at a time. New goals after transplant        Immunization History  Administered Date(s) Administered   Fluad Quad(high Dose 65+) 04/01/2019, 04/22/2020, 04/27/2021, 03/13/2022   Influenza, High Dose Seasonal PF 04/17/2018, 03/15/2023   Influenza-Unspecified 04/08/2015   PFIZER Comirnaty(Gray Top)Covid-19 Tri-Sucrose Vaccine 03/04/2020, 10/04/2020   PFIZER(Purple Top)SARS-COV-2 Vaccination 08/06/2019, 08/27/2019   PNEUMOCOCCAL CONJUGATE-20 10/30/2022   Pfizer Covid-19 Vaccine Bivalent Booster 40yrs & up 04/29/2021, 12/21/2021   Pfizer(Comirnaty)Fall Seasonal Vaccine 12 years and older 07/25/2022, 03/15/2023   Pneumococcal Conjugate-13 10/12/2016   Pneumococcal Polysaccharide-23 10/17/2017   Tdap 09/04/2011, 12/21/2021   Zoster Recombinant(Shingrix) 05/03/2022, 07/02/2022    Health Maintenance  Topic Date Due   OPHTHALMOLOGY EXAM  10/19/2020   HEMOGLOBIN A1C  06/13/2023   COVID-19 Vaccine (9 - Pfizer risk 2024-25 season) 09/12/2023   Diabetic kidney evaluation - Urine ACR  12/06/2023   Diabetic kidney evaluation - eGFR measurement  12/12/2023   INFLUENZA VACCINE  02/01/2024   Medicare Annual Wellness (AWV)  05/21/2024   MAMMOGRAM  06/04/2024   FOOT EXAM  12/19/2024   Colonoscopy  12/10/2025   DTaP/Tdap/Td (3 - Td or Tdap) 12/22/2031   Pneumococcal Vaccine: 50+ Years  Completed   DEXA SCAN  Completed   Hepatitis C Screening  Completed   Zoster Vaccines- Shingrix  Completed   HPV VACCINES  Aged Out   Meningococcal B Vaccine  Aged Out    Discussed health benefits of physical activity, and encouraged her to engage  in regular exercise appropriate for her age and condition.  Problem List Items Addressed This Visit       Cardiovascular and Mediastinum   Hypertension associated with diabetes (HCC)   Well controlled No longer on meds s/p kidney transplant Reviewed recent metabolic panel      Relevant Medications   OZEMPIC, 0.25 OR 0.5 MG/DOSE, 2 MG/3ML SOPN     Endocrine   Type 2 diabetes mellitus with diabetic chronic kidney disease (HCC)   Type 2 diabetes mellitus with improved glycemic control. Recent initiation of Ozempic has significantly lowered blood glucose levels, with current averages around 130 mg/dL. Last A1c was 8.3% in April. She reports no symptoms of hypoglycemia and is satisfied with the cost and effectiveness of Ozempic. - Order A1c and cholesterol tests after July 7th. - Continue current diabetes medications: Ozempic, glipizide, and Lantus. - Follow up with endocrinology as scheduled.      Relevant Medications   OZEMPIC, 0.25 OR 0.5 MG/DOSE, 2 MG/3ML SOPN   Other Relevant Orders   Lipid panel   Hemoglobin A1c   Hyperlipidemia associated with type 2 diabetes mellitus (HCC)   Hyperlipidemia, last cholesterol check was over a year ago. She is on pravastatin  and Zetia, reports increased cheese consumption, and is curious about current cholesterol levels. - Order cholesterol test after July 7th. - Continue current medications: pravastatin  and Zetia.      Relevant Medications   OZEMPIC, 0.25 OR 0.5 MG/DOSE, 2 MG/3ML SOPN   Other Relevant Orders   Lipid panel     Genitourinary   CKD (chronic kidney disease) stage 3, GFR 30-59 ml/min (HCC)   Chronic kidney disease stage 3. She is under regular follow-up with the transplant team at [facility] with no new symptoms  reported. - Continue follow-up with the transplant team at [facility].        Other   Status post kidney transplant   Kidney transplant. She is under regular follow-up with the transplant team at [facility] with no new symptoms  reported. - Continue follow-up with the transplant team at [facility].      Other Visit Diagnoses       Encounter for annual physical exam    -  Primary     Breast cancer screening by mammogram       Relevant Orders   MM 3D SCREENING MAMMOGRAM BILATERAL BREAST           General Health Maintenance She is up to date on immunizations including shingles, pneumonia, and tetanus. Current on colonoscopy and bone density screenings. Mammogram is due in December, and she plans to schedule it. She will receive a flu shot and COVID booster in the fall. - Perform foot exam today. - Track down eye exam report from July 30th appointment. - Schedule mammogram for December. - Receive flu shot and COVID booster in the fall.       Return in about 1 year (around 12/19/2024) for CPE.     Aden Agreste, MD  [facility] [phone] (phone) [phone] (fax)  [facility]

## 2023-12-20 NOTE — Assessment & Plan Note (Signed)
 Kidney transplant. She is under regular follow-up with the transplant team at [facility] with no new symptoms reported. - Continue follow-up with the transplant team at [facility].

## 2023-12-20 NOTE — Assessment & Plan Note (Signed)
Well controlled No longer on meds s/p kidney transplant Reviewed recent metabolic panel

## 2023-12-20 NOTE — Assessment & Plan Note (Signed)
 Type 2 diabetes mellitus with improved glycemic control. Recent initiation of Ozempic has significantly lowered blood glucose levels, with current averages around 130 mg/dL. Last A1c was 8.3% in April. She reports no symptoms of hypoglycemia and is satisfied with the cost and effectiveness of Ozempic. - Order A1c and cholesterol tests after July 7th. - Continue current diabetes medications: Ozempic, glipizide, and Lantus. - Follow up with endocrinology as scheduled.

## 2024-01-11 ENCOUNTER — Other Ambulatory Visit: Payer: Self-pay | Admitting: Family Medicine

## 2024-01-11 DIAGNOSIS — E7800 Pure hypercholesterolemia, unspecified: Secondary | ICD-10-CM

## 2024-01-14 DIAGNOSIS — E1122 Type 2 diabetes mellitus with diabetic chronic kidney disease: Secondary | ICD-10-CM | POA: Diagnosis not present

## 2024-01-14 DIAGNOSIS — E1169 Type 2 diabetes mellitus with other specified complication: Secondary | ICD-10-CM | POA: Diagnosis not present

## 2024-01-14 DIAGNOSIS — E785 Hyperlipidemia, unspecified: Secondary | ICD-10-CM | POA: Diagnosis not present

## 2024-01-14 NOTE — Telephone Encounter (Signed)
 Requested Prescriptions  Refused Prescriptions Disp Refills   pravastatin  (PRAVACHOL ) 20 MG tablet [Pharmacy Med Name: Pravastatin  Sodium 20 MG Oral Tablet] 100 tablet 2    Sig: TAKE 1 TABLET BY MOUTH AT  BEDTIME     Cardiovascular:  Antilipid - Statins Failed - 01/14/2024  2:30 PM      Failed - Lipid Panel in normal range within the last 12 months    Cholesterol, Total  Date Value Ref Range Status  12/12/2022 107 100 - 199 mg/dL Final   LDL Chol Calc (NIH)  Date Value Ref Range Status  12/12/2022 47 0 - 99 mg/dL Final   HDL  Date Value Ref Range Status  12/12/2022 36 (L) >39 mg/dL Final   Triglycerides  Date Value Ref Range Status  12/12/2022 140 0 - 149 mg/dL Final         Passed - Patient is not pregnant      Passed - Valid encounter within last 12 months    Recent Outpatient Visits           3 weeks ago Encounter for annual physical exam   [facility], Jon HERO, MD

## 2024-01-15 ENCOUNTER — Ambulatory Visit: Payer: Self-pay | Admitting: Family Medicine

## 2024-01-15 LAB — HEMOGLOBIN A1C
Est. average glucose Bld gHb Est-mCnc: 154 mg/dL
Hgb A1c MFr Bld: 7 % — ABNORMAL HIGH (ref 4.8–5.6)

## 2024-01-15 LAB — LIPID PANEL
Chol/HDL Ratio: 3.1 ratio (ref 0.0–4.4)
Cholesterol, Total: 116 mg/dL (ref 100–199)
HDL: 37 mg/dL — ABNORMAL LOW (ref >39)
LDL Chol Calc (NIH): 55 mg/dL (ref 0–99)
Triglycerides: 139 mg/dL (ref 0–149)
VLDL Cholesterol Cal: 24 mg/dL (ref 5–40)

## 2024-01-22 DIAGNOSIS — D849 Immunodeficiency, unspecified: Secondary | ICD-10-CM | POA: Diagnosis not present

## 2024-01-22 DIAGNOSIS — M7918 Myalgia, other site: Secondary | ICD-10-CM | POA: Diagnosis not present

## 2024-01-22 DIAGNOSIS — N200 Calculus of kidney: Secondary | ICD-10-CM | POA: Diagnosis not present

## 2024-01-22 DIAGNOSIS — Z48298 Encounter for aftercare following other organ transplant: Secondary | ICD-10-CM | POA: Diagnosis not present

## 2024-01-22 DIAGNOSIS — E1121 Type 2 diabetes mellitus with diabetic nephropathy: Secondary | ICD-10-CM | POA: Diagnosis not present

## 2024-01-22 DIAGNOSIS — I10 Essential (primary) hypertension: Secondary | ICD-10-CM | POA: Diagnosis not present

## 2024-01-22 DIAGNOSIS — Z794 Long term (current) use of insulin: Secondary | ICD-10-CM | POA: Diagnosis not present

## 2024-01-22 DIAGNOSIS — K265 Chronic or unspecified duodenal ulcer with perforation: Secondary | ICD-10-CM | POA: Diagnosis not present

## 2024-01-22 DIAGNOSIS — Z940 Kidney transplant status: Secondary | ICD-10-CM | POA: Diagnosis not present

## 2024-01-22 DIAGNOSIS — Z79621 Long term (current) use of calcineurin inhibitor: Secondary | ICD-10-CM | POA: Diagnosis not present

## 2024-01-30 DIAGNOSIS — H2511 Age-related nuclear cataract, right eye: Secondary | ICD-10-CM | POA: Diagnosis not present

## 2024-01-30 DIAGNOSIS — E119 Type 2 diabetes mellitus without complications: Secondary | ICD-10-CM | POA: Diagnosis not present

## 2024-01-30 DIAGNOSIS — H524 Presbyopia: Secondary | ICD-10-CM | POA: Diagnosis not present

## 2024-01-30 DIAGNOSIS — H25011 Cortical age-related cataract, right eye: Secondary | ICD-10-CM | POA: Diagnosis not present

## 2024-01-30 LAB — HM DIABETES EYE EXAM

## 2024-01-31 ENCOUNTER — Ambulatory Visit (LOCAL_COMMUNITY_HEALTH_CENTER)

## 2024-01-31 DIAGNOSIS — Z719 Counseling, unspecified: Secondary | ICD-10-CM

## 2024-01-31 DIAGNOSIS — Z23 Encounter for immunization: Secondary | ICD-10-CM

## 2024-01-31 NOTE — Progress Notes (Signed)
 Patient seen in nurse clinic for RSV vaccine.  RSV given and tolerated well. VIS provided. NCIR updated and copy provided. Discussed return dates for vaccine shots.

## 2024-02-07 ENCOUNTER — Encounter: Payer: Self-pay | Admitting: Family Medicine

## 2024-03-01 ENCOUNTER — Other Ambulatory Visit: Payer: Self-pay | Admitting: Family Medicine

## 2024-03-01 DIAGNOSIS — E7800 Pure hypercholesterolemia, unspecified: Secondary | ICD-10-CM

## 2024-03-25 DIAGNOSIS — L821 Other seborrheic keratosis: Secondary | ICD-10-CM | POA: Diagnosis not present

## 2024-03-25 DIAGNOSIS — L219 Seborrheic dermatitis, unspecified: Secondary | ICD-10-CM | POA: Diagnosis not present

## 2024-03-25 DIAGNOSIS — D1801 Hemangioma of skin and subcutaneous tissue: Secondary | ICD-10-CM | POA: Diagnosis not present

## 2024-03-25 DIAGNOSIS — L659 Nonscarring hair loss, unspecified: Secondary | ICD-10-CM | POA: Diagnosis not present

## 2024-03-25 DIAGNOSIS — D225 Melanocytic nevi of trunk: Secondary | ICD-10-CM | POA: Diagnosis not present

## 2024-03-25 DIAGNOSIS — D2260 Melanocytic nevi of unspecified upper limb, including shoulder: Secondary | ICD-10-CM | POA: Diagnosis not present

## 2024-03-25 DIAGNOSIS — L720 Epidermal cyst: Secondary | ICD-10-CM | POA: Diagnosis not present

## 2024-03-25 DIAGNOSIS — L814 Other melanin hyperpigmentation: Secondary | ICD-10-CM | POA: Diagnosis not present

## 2024-03-25 DIAGNOSIS — D2270 Melanocytic nevi of unspecified lower limb, including hip: Secondary | ICD-10-CM | POA: Diagnosis not present

## 2024-04-08 DIAGNOSIS — E1121 Type 2 diabetes mellitus with diabetic nephropathy: Secondary | ICD-10-CM | POA: Diagnosis not present

## 2024-04-08 DIAGNOSIS — Z794 Long term (current) use of insulin: Secondary | ICD-10-CM | POA: Diagnosis not present

## 2024-04-08 DIAGNOSIS — R739 Hyperglycemia, unspecified: Secondary | ICD-10-CM | POA: Diagnosis not present

## 2024-04-17 ENCOUNTER — Ambulatory Visit

## 2024-04-17 DIAGNOSIS — Z23 Encounter for immunization: Secondary | ICD-10-CM | POA: Diagnosis not present

## 2024-04-17 DIAGNOSIS — Z719 Counseling, unspecified: Secondary | ICD-10-CM

## 2024-04-17 NOTE — Progress Notes (Signed)
 Patient seen in nurse clinic for COVID and Flu vaccines.  COVID/Pfizer and Flu HD given and tolerated well. VIS provided. NCIR updated and copy provided. Waited 5 minutes after vaccine.

## 2024-04-18 DIAGNOSIS — Z79899 Other long term (current) drug therapy: Secondary | ICD-10-CM | POA: Diagnosis not present

## 2024-04-18 DIAGNOSIS — Z940 Kidney transplant status: Secondary | ICD-10-CM | POA: Diagnosis not present

## 2024-05-27 ENCOUNTER — Ambulatory Visit (INDEPENDENT_AMBULATORY_CARE_PROVIDER_SITE_OTHER): Payer: Self-pay

## 2024-05-27 VITALS — BP 152/80 | Ht 69.0 in | Wt 212.2 lb

## 2024-05-27 DIAGNOSIS — Z0000 Encounter for general adult medical examination without abnormal findings: Secondary | ICD-10-CM

## 2024-05-27 NOTE — Progress Notes (Signed)
 No chief complaint on file.    Subjective:   KHALANI NOVOA is a 73 y.o. female who presents for a Medicare Annual Wellness Visit.  Allergies (verified) Mixed vespid venom and Ciprofloxacin   History: Past Medical History:  Diagnosis Date   Anemia    Blood transfusion without reported diagnosis    Cataract    Diabetes mellitus without complication (HCC)    GERD (gastroesophageal reflux disease)    Guaiac positive stools    Heart murmur    History of renal calculi    Hypercholesteremia    Hypertension    Kidney stones    Takes lisinopril  for kidney protection   Kidney stones    Parathyroid disorder    Sleep apnea    does not use CPAP   Ulcer    Vertigo    in past   Past Surgical History:  Procedure Laterality Date   CATARACT EXTRACTION W/PHACO Left 05/14/2017   Procedure: CATARACT EXTRACTION PHACO AND INTRAOCULAR LENS PLACEMENT (IOC) LEFT DIABETIC;  Surgeon: Mittie Gaskin, MD;  Location: [facility];  Service: Ophthalmology;  Laterality: Left;  diabetic   COLONOSCOPY     2003,2007,2015,2017   COLONOSCOPY WITH PROPOFOL  N/A 12/10/2020   Procedure: COLONOSCOPY WITH PROPOFOL ;  Surgeon: Maryruth Ole DASEN, MD;  Location: [facility];  Service: Endoscopy;  Laterality: N/A;  DM   CYSTOSCOPY WITH URETEROSCOPY AND STENT PLACEMENT  09/29/2015   Dr. Twylla   DIALYSIS/PERMA CATHETER INSERTION Left 06/21/2018   Procedure: DIALYSIS/PERMA CATHETER INSERTION;  Surgeon: Jama Cordella MATSU, MD;  Location: [facility];  Service: Cardiovascular;  Laterality: Left;   DIALYSIS/PERMA CATHETER REMOVAL N/A 07/18/2018   Procedure: DIALYSIS/PERMA CATHETER REMOVAL;  Surgeon: Marea Selinda RAMAN, MD;  Location: [facility];  Service: Cardiovascular;  Laterality: N/A;   ESOPHAGOGASTRODUODENOSCOPY ENDOSCOPY     LITHOTRIPSY  2002   PARATHYROIDECTOMY  1990   Family History  Problem Relation Age of Onset   Heart disease Mother    Atrial fibrillation Mother     Colon cancer Father    Coronary artery disease Maternal Grandmother    Breast cancer Neg Hx    Social History   Occupational History   Occupation: retired  Tobacco Use   Smoking status: Never   Smokeless tobacco: Never  Vaping Use   Vaping status: Never Used  Substance and Sexual Activity   Alcohol use: No    Comment: 2-3 times a year   Drug use: No   Sexual activity: Not Currently   Tobacco Counseling Counseling given: Not Answered  SDOH Screenings   Food Insecurity: No Food Insecurity (05/22/2024)  Housing: Low Risk  (05/22/2024)  Transportation Needs: No Transportation Needs (05/22/2024)  Utilities: Not At Risk (04/02/2024)   Received from [facility]  Alcohol Screen: Low Risk  (05/22/2023)  Depression (PHQ2-9): Low Risk  (05/22/2023)  Financial Resource Strain: Low Risk  (05/22/2024)  Physical Activity: Sufficiently Active (05/22/2024)  Social Connections: Moderately Integrated (05/22/2024)  Stress: No Stress Concern Present (05/22/2024)  Tobacco Use: Medium Risk (04/22/2024)   [facility] from [facility]  Health Literacy: Adequate Health Literacy (05/22/2023)   See flowsheets for full screening details  Depression Screen     Goals Addressed   None    Functional Status Activities of Daily Living (to include ambulation/medication): (Patient-Rptd) Independent Ambulation: (Patient-Rptd) Independent Home Management: (Patient-Rptd) Independent  Fall Screening Falls in the past year?: (Patient-Rptd) 0 Number of falls in past year: (Patient-Rptd) 0  Objective:    There were no vitals filed for this visit. There is no height or weight on file to calculate BMI.  Current Medications (verified) Outpatient Encounter Medications as of 05/27/2024  Medication Sig   acetaminophen  (TYLENOL ) 325 MG tablet Take 2 tablets (650 mg total) by mouth every 6 (six) hours as needed for mild pain (or Fever >/= 101).   Biotin 1000  MCG tablet Take 1,000 mcg by mouth daily.   Calcium  Carbonate Antacid (TUMS PO) 750 mg in the morning, at noon, and at bedtime. (Patient taking differently: 750 mg daily at 12 noon.)   Cholecalciferol (VITAMIN D3) 50 MCG (2000 UT) capsule Take 1,000 Units by mouth daily.   ezetimibe (ZETIA) 10 MG tablet Take 1 tablet by mouth as directed.   famotidine  (PEPCID ) 20 MG tablet Take 20 mg by mouth daily.   glipiZIDE (GLUCOTROL) 5 MG tablet in the morning and at bedtime.   ketoconazole (NIZORAL) 2 % shampoo Apply 1 Application topically once.   LANTUS SOLOSTAR 100 UNIT/ML Solostar Pen Inject 45 Units into the skin at bedtime. (Patient taking differently: Inject 45 Units into the skin at bedtime.)   Multiple Vitamin (MULTI-VITAMIN) tablet Take 1 tablet by mouth daily. CENTRUM SILVER   mycophenolate (CELLCEPT) 250 MG capsule in the morning, at noon, in the evening, and at bedtime. (Patient taking differently: 2 (two) times daily.)   OVER THE COUNTER MEDICATION Magnesium  Plus Protein 133mg  2 tablets at 1pm and 5pm   OZEMPIC, 0.25 OR 0.5 MG/DOSE, 2 MG/3ML SOPN Inject 0.5 mg into the skin.   PANTOPRAZOLE SODIUM PO Take 40 mg by mouth daily in the afternoon.   pravastatin  (PRAVACHOL ) 20 MG tablet TAKE 1 TABLET BY MOUTH AT  BEDTIME   predniSONE (DELTASONE) 5 MG tablet Take by mouth.   sodium bicarbonate  650 MG tablet Take 1,300 mg by mouth 2 (two) times daily.    tacrolimus  (PROGRAF ) 1 MG capsule in the morning, at noon, and at bedtime.   No facility-administered encounter medications on file as of 05/27/2024.   Hearing/Vision screen No results found. Immunizations and Health Maintenance Health Maintenance  Topic Date Due   Diabetic kidney evaluation - Urine ACR  12/06/2023   Diabetic kidney evaluation - eGFR measurement  12/12/2023   Mammogram  06/04/2024   HEMOGLOBIN A1C  07/16/2024   COVID-19 Vaccine (10 - Pfizer risk 2025-26 season) 10/16/2024   FOOT EXAM  12/19/2024   OPHTHALMOLOGY EXAM   01/29/2025   Medicare Annual Wellness (AWV)  05/27/2025   Colonoscopy  12/10/2025   DTaP/Tdap/Td (3 - Td or Tdap) 12/22/2031   Pneumococcal Vaccine: 50+ Years  Completed   Influenza Vaccine  Completed   Bone Density Scan  Completed   Hepatitis C Screening  Completed   Zoster Vaccines- Shingrix  Completed   Meningococcal B Vaccine  Aged Out        Assessment/Plan:  This is a routine wellness examination for [place].  Patient Care Team: Myrla Jon HERO, MD as PCP - General (Family Medicine) Dennise Capri, MD (Nephrology) Twylla Glendia BROCKS, MD (Urology) Lucio Franky PARAS, OD (Optometry) Monahan, Thomas W IV, DMD as Referring Physician (Dentistry)  I have personally reviewed and noted the following in the patient's chart:   Medical and social history Use of alcohol, tobacco or illicit drugs  Current medications and supplements including opioid prescriptions. Functional ability and status Nutritional status Physical activity Advanced directives List of other physicians Hospitalizations, surgeries, and ER visits in previous 12 months Vitals  Screenings to include cognitive, depression, and falls Referrals and appointments  No orders of the defined types were placed in this encounter.  In addition, I have reviewed and discussed with patient certain preventive protocols, quality metrics, and best practice recommendations. A written personalized care plan for preventive services as well as general preventive health recommendations were provided to patient.   Jhonnie GORMAN Das, LPN   [id number]   Return in 1 year (on 05/27/2025).  After Visit Summary: (In Person-Declined) Patient declined AVS at this time.  Nurse Notes: UTD ON ALL SHOTS; MAMMOGRAM SCHEDULED; UTD ON COLONOSCOPY & BDS

## 2024-05-27 NOTE — Patient Instructions (Addendum)
 Debra Paul,  Thank you for taking the time for your Medicare Wellness Visit. I appreciate your continued commitment to your health goals. Please review the care plan we discussed, and feel free to reach out if I can assist you further.  Please note that Annual Wellness Visits do not include a physical exam. Some assessments may be limited, especially if the visit was conducted virtually. If needed, we may recommend an in-person follow-up with your provider.  Ongoing Care Seeing your primary care provider every 3 to 6 months helps us  monitor your health and provide consistent, personalized care.   Referrals If a referral was made during today's visit and you haven't received any updates within two weeks, please contact the referred provider directly to check on the status.  Recommended Screenings:  Health Maintenance  Topic Date Due   Yearly kidney health urinalysis for diabetes  12/06/2023   Yearly kidney function blood test for diabetes  12/12/2023   Breast Cancer Screening  06/04/2024   Hemoglobin A1C  07/16/2024   COVID-19 Vaccine (11 - Pfizer risk 2025-26 season) 10/16/2024   Complete foot exam   12/19/2024   Eye exam for diabetics  01/29/2025   Medicare Annual Wellness Visit  05/27/2025   Colon Cancer Screening  12/10/2025   Osteoporosis screening with Bone Density Scan  06/27/2028   DTaP/Tdap/Td vaccine (3 - Td or Tdap) 12/22/2031   Pneumococcal Vaccine for age over 39  Completed   Flu Shot  Completed   Hepatitis C Screening  Completed   Zoster (Shingles) Vaccine  Completed   Meningitis B Vaccine  Aged Out     Vision: Annual vision screenings are recommended for early detection of glaucoma, cataracts, and diabetic retinopathy. These exams can also reveal signs of chronic conditions such as diabetes and high blood pressure.  Dental: Annual dental screenings help detect early signs of oral cancer, gum disease, and other conditions linked to overall health, including  heart disease and diabetes.  Please see the attached documents for additional preventive care recommendations.   NEXT AWV 06/02/25 @ 10:10 AM IN PERSON

## 2024-06-05 ENCOUNTER — Ambulatory Visit
Admission: RE | Admit: 2024-06-05 | Discharge: 2024-06-05 | Disposition: A | Source: Ambulatory Visit | Attending: Family Medicine

## 2024-06-05 DIAGNOSIS — Z1231 Encounter for screening mammogram for malignant neoplasm of breast: Secondary | ICD-10-CM | POA: Insufficient documentation

## 2024-07-25 NOTE — Progress Notes (Signed)
 Debra Paul                                          MRN: [id number]   07/25/2024   The [facility]BCI Quality Team Specialist reviewed this patient medical record for the purposes of chart review for care gap closure. The following were reviewed: chart review for care gap closure-glycemic status assessment.    VBCI Quality Team

## 2024-07-28 NOTE — Progress Notes (Signed)
 Debra Paul                                          MRN: [id number]   07/28/2024   The VBCI Quality Team Specialist reviewed this patient medical record for the purposes of chart review for care gap closure. The following were reviewed: chart review for care gap closure-glycemic status assessment.    VBCI Quality Team

## 2024-12-22 ENCOUNTER — Encounter: Admitting: Family Medicine

## 2025-06-02 ENCOUNTER — Ambulatory Visit
